# Patient Record
Sex: Female | Born: 1945
Health system: Southern US, Community
[De-identification: ages and names within clinical notes are randomized; demographics above are authoritative.]

## PROBLEM LIST (undated history)

## (undated) DIAGNOSIS — IMO0002 Reserved for concepts with insufficient information to code with codable children: Secondary | ICD-10-CM

## (undated) DIAGNOSIS — J4540 Moderate persistent asthma, uncomplicated: Secondary | ICD-10-CM

## (undated) DIAGNOSIS — L570 Actinic keratosis: Secondary | ICD-10-CM

## (undated) DIAGNOSIS — E785 Hyperlipidemia, unspecified: Secondary | ICD-10-CM

## (undated) DIAGNOSIS — M8580 Other specified disorders of bone density and structure, unspecified site: Secondary | ICD-10-CM

## (undated) DIAGNOSIS — T7840XA Allergy, unspecified, initial encounter: Secondary | ICD-10-CM

## (undated) HISTORY — DX: Allergy, unspecified, initial encounter: T78.40XA

## (undated) HISTORY — DX: Other specified disorders of bone density and structure, unspecified site: M85.80

## (undated) HISTORY — DX: Reserved for concepts with insufficient information to code with codable children: IMO0002

## (undated) HISTORY — PX: EYE SURGERY: SHX253

## (undated) HISTORY — DX: Hyperlipidemia, unspecified: E78.5

## (undated) HISTORY — PX: TRIGGER FINGER RELEASE: SHX641

## (undated) HISTORY — DX: Actinic keratosis: L57.0

## (undated) HISTORY — PX: KNEE CARTILAGE SURGERY: SHX688

## (undated) HISTORY — PX: BACK SURGERY: SHX140

---

## 1987-09-02 HISTORY — PX: ABDOMINAL HYSTERECTOMY: SHX81

## 1999-08-21 ENCOUNTER — Encounter: Admission: RE | Admit: 1999-08-21 | Discharge: 1999-08-21 | Payer: Self-pay | Admitting: Obstetrics and Gynecology

## 1999-08-21 ENCOUNTER — Encounter: Payer: Self-pay | Admitting: Obstetrics and Gynecology

## 2000-03-16 ENCOUNTER — Encounter: Admission: RE | Admit: 2000-03-16 | Discharge: 2000-03-16 | Payer: Self-pay | Admitting: Obstetrics and Gynecology

## 2000-03-16 ENCOUNTER — Encounter: Payer: Self-pay | Admitting: Obstetrics and Gynecology

## 2001-03-18 ENCOUNTER — Encounter: Payer: Self-pay | Admitting: Obstetrics and Gynecology

## 2001-03-18 ENCOUNTER — Encounter: Admission: RE | Admit: 2001-03-18 | Discharge: 2001-03-18 | Payer: Self-pay | Admitting: Obstetrics and Gynecology

## 2001-08-09 ENCOUNTER — Ambulatory Visit (HOSPITAL_COMMUNITY): Admission: RE | Admit: 2001-08-09 | Discharge: 2001-08-09 | Payer: Self-pay | Admitting: Gastroenterology

## 2001-08-23 ENCOUNTER — Encounter: Payer: Self-pay | Admitting: Obstetrics and Gynecology

## 2001-08-23 ENCOUNTER — Encounter: Admission: RE | Admit: 2001-08-23 | Discharge: 2001-08-23 | Payer: Self-pay | Admitting: Obstetrics and Gynecology

## 2001-08-27 ENCOUNTER — Other Ambulatory Visit: Admission: RE | Admit: 2001-08-27 | Discharge: 2001-08-27 | Payer: Self-pay | Admitting: Obstetrics and Gynecology

## 2002-03-25 ENCOUNTER — Encounter: Admission: RE | Admit: 2002-03-25 | Discharge: 2002-03-25 | Payer: Self-pay | Admitting: Family Medicine

## 2002-03-25 ENCOUNTER — Encounter: Payer: Self-pay | Admitting: Family Medicine

## 2003-04-04 ENCOUNTER — Encounter: Payer: Self-pay | Admitting: Family Medicine

## 2003-04-04 ENCOUNTER — Encounter: Admission: RE | Admit: 2003-04-04 | Discharge: 2003-04-04 | Payer: Self-pay | Admitting: Family Medicine

## 2004-03-20 ENCOUNTER — Other Ambulatory Visit: Admission: RE | Admit: 2004-03-20 | Discharge: 2004-03-20 | Payer: Self-pay | Admitting: Family Medicine

## 2004-03-20 ENCOUNTER — Encounter: Payer: Self-pay | Admitting: Family Medicine

## 2004-03-20 LAB — CONVERTED CEMR LAB: Pap Smear: NORMAL

## 2004-04-10 ENCOUNTER — Encounter: Admission: RE | Admit: 2004-04-10 | Discharge: 2004-04-10 | Payer: Self-pay | Admitting: Family Medicine

## 2004-07-05 ENCOUNTER — Ambulatory Visit: Payer: Self-pay | Admitting: Family Medicine

## 2004-08-01 ENCOUNTER — Ambulatory Visit: Payer: Self-pay | Admitting: Family Medicine

## 2004-09-04 ENCOUNTER — Ambulatory Visit: Payer: Self-pay | Admitting: Family Medicine

## 2004-10-14 ENCOUNTER — Ambulatory Visit: Payer: Self-pay | Admitting: Family Medicine

## 2004-10-15 ENCOUNTER — Ambulatory Visit: Payer: Self-pay | Admitting: Family Medicine

## 2004-10-30 ENCOUNTER — Ambulatory Visit: Payer: Self-pay | Admitting: Family Medicine

## 2005-03-10 ENCOUNTER — Ambulatory Visit: Payer: Self-pay | Admitting: Family Medicine

## 2005-04-29 ENCOUNTER — Encounter: Admission: RE | Admit: 2005-04-29 | Discharge: 2005-04-29 | Payer: Self-pay | Admitting: Family Medicine

## 2005-09-01 HISTORY — PX: OTHER SURGICAL HISTORY: SHX169

## 2005-10-31 ENCOUNTER — Ambulatory Visit: Payer: Self-pay | Admitting: Family Medicine

## 2005-11-26 ENCOUNTER — Ambulatory Visit: Payer: Self-pay

## 2006-01-20 ENCOUNTER — Ambulatory Visit: Payer: Self-pay | Admitting: Family Medicine

## 2006-01-30 LAB — HM DEXA SCAN

## 2006-02-04 LAB — FECAL OCCULT BLOOD, GUAIAC: Fecal Occult Blood: NEGATIVE

## 2006-02-06 ENCOUNTER — Ambulatory Visit: Payer: Self-pay | Admitting: Family Medicine

## 2006-02-09 ENCOUNTER — Encounter: Admission: RE | Admit: 2006-02-09 | Discharge: 2006-02-09 | Payer: Self-pay | Admitting: Family Medicine

## 2006-05-15 ENCOUNTER — Ambulatory Visit: Payer: Self-pay | Admitting: Family Medicine

## 2006-06-02 ENCOUNTER — Encounter: Admission: RE | Admit: 2006-06-02 | Discharge: 2006-06-02 | Payer: Self-pay | Admitting: Family Medicine

## 2006-07-20 ENCOUNTER — Ambulatory Visit: Payer: Self-pay | Admitting: Family Medicine

## 2006-12-31 LAB — HM COLONOSCOPY: HM Colonoscopy: NORMAL

## 2007-03-03 ENCOUNTER — Ambulatory Visit: Payer: Self-pay

## 2007-03-03 ENCOUNTER — Encounter: Payer: Self-pay | Admitting: Family Medicine

## 2007-03-22 ENCOUNTER — Encounter: Payer: Self-pay | Admitting: Family Medicine

## 2007-04-06 ENCOUNTER — Encounter: Payer: Self-pay | Admitting: Family Medicine

## 2007-04-06 DIAGNOSIS — J309 Allergic rhinitis, unspecified: Secondary | ICD-10-CM | POA: Insufficient documentation

## 2007-04-06 DIAGNOSIS — E785 Hyperlipidemia, unspecified: Secondary | ICD-10-CM | POA: Insufficient documentation

## 2007-04-06 DIAGNOSIS — J45909 Unspecified asthma, uncomplicated: Secondary | ICD-10-CM | POA: Insufficient documentation

## 2007-04-06 DIAGNOSIS — M8580 Other specified disorders of bone density and structure, unspecified site: Secondary | ICD-10-CM

## 2007-04-06 DIAGNOSIS — R011 Cardiac murmur, unspecified: Secondary | ICD-10-CM | POA: Insufficient documentation

## 2007-04-06 DIAGNOSIS — M810 Age-related osteoporosis without current pathological fracture: Secondary | ICD-10-CM | POA: Insufficient documentation

## 2007-04-07 ENCOUNTER — Encounter: Payer: Self-pay | Admitting: Pain Medicine

## 2007-04-15 DIAGNOSIS — M5137 Other intervertebral disc degeneration, lumbosacral region: Secondary | ICD-10-CM | POA: Insufficient documentation

## 2007-04-30 ENCOUNTER — Ambulatory Visit: Payer: Self-pay | Admitting: Family Medicine

## 2007-05-03 ENCOUNTER — Encounter: Payer: Self-pay | Admitting: Pain Medicine

## 2007-05-04 LAB — CONVERTED CEMR LAB
ALT: 26 units/L (ref 0–35)
AST: 24 units/L (ref 0–37)
Albumin: 4 g/dL (ref 3.5–5.2)
Alkaline Phosphatase: 52 units/L (ref 39–117)
BUN: 10 mg/dL (ref 6–23)
Basophils Absolute: 0 10*3/uL (ref 0.0–0.1)
Basophils Relative: 0.6 % (ref 0.0–1.0)
Bilirubin, Direct: 0.1 mg/dL (ref 0.0–0.3)
CO2: 29 meq/L (ref 19–32)
Calcium: 9.8 mg/dL (ref 8.4–10.5)
Chloride: 104 meq/L (ref 96–112)
Cholesterol: 259 mg/dL (ref 0–200)
Creatinine, Ser: 0.7 mg/dL (ref 0.4–1.2)
Direct LDL: 177.5 mg/dL
Eosinophils Absolute: 0.2 10*3/uL (ref 0.0–0.6)
Eosinophils Relative: 5 % (ref 0.0–5.0)
GFR calc Af Amer: 109 mL/min
GFR calc non Af Amer: 90 mL/min
Glucose, Bld: 86 mg/dL (ref 70–99)
HCT: 39.6 % (ref 36.0–46.0)
HDL: 46.6 mg/dL (ref >39.0)
Hemoglobin: 13.7 g/dL (ref 12.0–15.0)
Lymphocytes Relative: 34.9 % (ref 12.0–46.0)
MCHC: 34.5 g/dL (ref 30.0–36.0)
MCV: 90.9 fL (ref 78.0–100.0)
Monocytes Absolute: 0.4 10*3/uL (ref 0.2–0.7)
Monocytes Relative: 10 % (ref 3.0–11.0)
Neutro Abs: 2.3 10*3/uL (ref 1.4–7.7)
Neutrophils Relative %: 49.5 % (ref 43.0–77.0)
Platelets: 196 10*3/uL (ref 150–400)
Potassium: 4.9 meq/L (ref 3.5–5.1)
RBC: 4.36 M/uL (ref 3.87–5.11)
RDW: 12 % (ref 11.5–14.6)
Sodium: 142 meq/L (ref 135–145)
TSH: 1.42 microintl units/mL (ref 0.35–5.50)
Total Bilirubin: 0.9 mg/dL (ref 0.3–1.2)
Total CHOL/HDL Ratio: 5.6
Total Protein: 7 g/dL (ref 6.0–8.3)
Triglycerides: 168 mg/dL — ABNORMAL HIGH (ref 0–149)
VLDL: 34 mg/dL (ref 0–40)
WBC: 4.4 10*3/uL — ABNORMAL LOW (ref 4.5–10.5)

## 2007-05-10 ENCOUNTER — Encounter: Payer: Self-pay | Admitting: Family Medicine

## 2007-06-02 ENCOUNTER — Encounter: Payer: Self-pay | Admitting: Pain Medicine

## 2007-06-07 ENCOUNTER — Encounter: Admission: RE | Admit: 2007-06-07 | Discharge: 2007-06-07 | Payer: Self-pay | Admitting: Family Medicine

## 2007-06-09 ENCOUNTER — Encounter (INDEPENDENT_AMBULATORY_CARE_PROVIDER_SITE_OTHER): Payer: Self-pay | Admitting: *Deleted

## 2007-07-12 ENCOUNTER — Encounter: Payer: Self-pay | Admitting: Family Medicine

## 2007-07-22 ENCOUNTER — Encounter: Payer: Self-pay | Admitting: Family Medicine

## 2007-08-24 ENCOUNTER — Ambulatory Visit: Payer: Self-pay | Admitting: Family Medicine

## 2007-08-27 ENCOUNTER — Telehealth: Payer: Self-pay | Admitting: Family Medicine

## 2007-08-27 LAB — CONVERTED CEMR LAB
Cholesterol: 205 mg/dL (ref 0–200)
Direct LDL: 129.9 mg/dL
HDL: 49.7 mg/dL (ref >39.0)
Total CHOL/HDL Ratio: 4.1
Triglycerides: 119 mg/dL (ref 0–149)
VLDL: 24 mg/dL (ref 0–40)

## 2007-08-30 ENCOUNTER — Ambulatory Visit: Payer: Self-pay | Admitting: Family Medicine

## 2007-09-29 ENCOUNTER — Encounter: Payer: Self-pay | Admitting: Family Medicine

## 2007-10-01 ENCOUNTER — Telehealth (INDEPENDENT_AMBULATORY_CARE_PROVIDER_SITE_OTHER): Payer: Self-pay | Admitting: *Deleted

## 2007-10-11 ENCOUNTER — Telehealth: Payer: Self-pay | Admitting: Family Medicine

## 2007-10-14 ENCOUNTER — Ambulatory Visit: Payer: Self-pay | Admitting: Family Medicine

## 2007-10-19 ENCOUNTER — Telehealth: Payer: Self-pay | Admitting: Family Medicine

## 2007-10-20 ENCOUNTER — Ambulatory Visit: Payer: Self-pay | Admitting: Family Medicine

## 2007-10-25 ENCOUNTER — Telehealth (INDEPENDENT_AMBULATORY_CARE_PROVIDER_SITE_OTHER): Payer: Self-pay | Admitting: Internal Medicine

## 2007-11-29 ENCOUNTER — Encounter: Payer: Self-pay | Admitting: Family Medicine

## 2008-02-03 ENCOUNTER — Ambulatory Visit: Payer: Self-pay | Admitting: Family Medicine

## 2008-02-14 ENCOUNTER — Ambulatory Visit: Payer: Self-pay | Admitting: Family Medicine

## 2008-02-14 ENCOUNTER — Telehealth: Payer: Self-pay | Admitting: Family Medicine

## 2008-02-14 ENCOUNTER — Encounter (INDEPENDENT_AMBULATORY_CARE_PROVIDER_SITE_OTHER): Payer: Self-pay | Admitting: Internal Medicine

## 2008-02-15 ENCOUNTER — Telehealth (INDEPENDENT_AMBULATORY_CARE_PROVIDER_SITE_OTHER): Payer: Self-pay | Admitting: Internal Medicine

## 2008-02-15 LAB — CONVERTED CEMR LAB
Basophils Absolute: 0.1 10*3/uL (ref 0.0–0.1)
Basophils Relative: 1.1 % — ABNORMAL HIGH (ref 0.0–1.0)
Eosinophils Absolute: 0 10*3/uL (ref 0.0–0.7)
Eosinophils Relative: 0.6 % (ref 0.0–5.0)
HCT: 40.7 % (ref 36.0–46.0)
Hemoglobin: 14.3 g/dL (ref 12.0–15.0)
Lymphocytes Relative: 17.4 % (ref 12.0–46.0)
MCHC: 35.1 g/dL (ref 30.0–36.0)
MCV: 89.9 fL (ref 78.0–100.0)
Monocytes Absolute: 0.6 10*3/uL (ref 0.1–1.0)
Monocytes Relative: 11.4 % (ref 3.0–12.0)
Neutro Abs: 3.5 10*3/uL (ref 1.4–7.7)
Neutrophils Relative %: 69.5 % (ref 43.0–77.0)
Platelets: 157 10*3/uL (ref 150–400)
RBC: 4.53 M/uL (ref 3.87–5.11)
RDW: 12.1 % (ref 11.5–14.6)
WBC: 5.1 10*3/uL (ref 4.5–10.5)

## 2008-02-18 ENCOUNTER — Telehealth (INDEPENDENT_AMBULATORY_CARE_PROVIDER_SITE_OTHER): Payer: Self-pay | Admitting: *Deleted

## 2008-02-25 ENCOUNTER — Ambulatory Visit: Payer: Self-pay | Admitting: Internal Medicine

## 2008-02-25 LAB — CONVERTED CEMR LAB
Bilirubin Urine: NEGATIVE
Glucose, Urine, Semiquant: NEGATIVE
Ketones, urine, test strip: NEGATIVE
Nitrite: NEGATIVE
Protein, U semiquant: NEGATIVE
Specific Gravity, Urine: <1.005
Urobilinogen, UA: 0.2
WBC Urine, dipstick: NEGATIVE
pH: 7.5

## 2008-04-21 ENCOUNTER — Encounter: Payer: Self-pay | Admitting: Family Medicine

## 2008-04-27 ENCOUNTER — Telehealth: Payer: Self-pay | Admitting: Family Medicine

## 2008-05-05 ENCOUNTER — Encounter: Admission: RE | Admit: 2008-05-05 | Discharge: 2008-05-05 | Payer: Self-pay | Admitting: Family Medicine

## 2008-05-05 ENCOUNTER — Encounter: Payer: Self-pay | Admitting: Family Medicine

## 2008-06-09 ENCOUNTER — Encounter: Admission: RE | Admit: 2008-06-09 | Discharge: 2008-06-09 | Payer: Self-pay | Admitting: Family Medicine

## 2008-06-20 ENCOUNTER — Encounter: Payer: Self-pay | Admitting: Family Medicine

## 2008-07-01 ENCOUNTER — Ambulatory Visit: Payer: Self-pay | Admitting: Unknown Physician Specialty

## 2008-07-26 ENCOUNTER — Ambulatory Visit: Payer: Self-pay | Admitting: Family Medicine

## 2008-07-31 ENCOUNTER — Telehealth: Payer: Self-pay | Admitting: Family Medicine

## 2008-07-31 LAB — CONVERTED CEMR LAB
ALT: 20 units/L (ref 0–35)
AST: 24 units/L (ref 0–37)
Albumin: 4.1 g/dL (ref 3.5–5.2)
Alkaline Phosphatase: 51 units/L (ref 39–117)
BUN: 11 mg/dL (ref 6–23)
Basophils Absolute: 0 10*3/uL (ref 0.0–0.1)
Basophils Relative: 0 % (ref 0.0–3.0)
Bilirubin, Direct: 0.1 mg/dL (ref 0.0–0.3)
CO2: 29 meq/L (ref 19–32)
Calcium: 9.6 mg/dL (ref 8.4–10.5)
Chloride: 106 meq/L (ref 96–112)
Cholesterol: 211 mg/dL (ref 0–200)
Creatinine, Ser: 0.7 mg/dL (ref 0.4–1.2)
Direct LDL: 118.7 mg/dL
Eosinophils Absolute: 0.1 10*3/uL (ref 0.0–0.7)
Eosinophils Relative: 2.4 % (ref 0.0–5.0)
GFR calc Af Amer: 108 mL/min
GFR calc non Af Amer: 90 mL/min
Glucose, Bld: 79 mg/dL (ref 70–99)
HCT: 41.9 % (ref 36.0–46.0)
HDL: 68 mg/dL (ref >39.0)
Hemoglobin: 14.7 g/dL (ref 12.0–15.0)
Lymphocytes Relative: 38.5 % (ref 12.0–46.0)
MCHC: 35.2 g/dL (ref 30.0–36.0)
MCV: 90.2 fL (ref 78.0–100.0)
Monocytes Absolute: 0.5 10*3/uL (ref 0.1–1.0)
Monocytes Relative: 9.7 % (ref 3.0–12.0)
Neutro Abs: 2.3 10*3/uL (ref 1.4–7.7)
Neutrophils Relative %: 49.4 % (ref 43.0–77.0)
Platelets: 173 10*3/uL (ref 150–400)
Potassium: 4.3 meq/L (ref 3.5–5.1)
RBC: 4.65 M/uL (ref 3.87–5.11)
RDW: 12.7 % (ref 11.5–14.6)
Sodium: 142 meq/L (ref 135–145)
TSH: 1.3 microintl units/mL (ref 0.35–5.50)
Total Bilirubin: 0.9 mg/dL (ref 0.3–1.2)
Total CHOL/HDL Ratio: 3.1
Total Protein: 7.5 g/dL (ref 6.0–8.3)
Triglycerides: 84 mg/dL (ref 0–149)
VLDL: 17 mg/dL (ref 0–40)
Vit D, 1,25-Dihydroxy: 34 (ref 30–89)
WBC: 4.7 10*3/uL (ref 4.5–10.5)

## 2008-08-15 ENCOUNTER — Ambulatory Visit: Payer: Self-pay | Admitting: Family Medicine

## 2008-11-07 ENCOUNTER — Encounter (INDEPENDENT_AMBULATORY_CARE_PROVIDER_SITE_OTHER): Payer: Self-pay | Admitting: Internal Medicine

## 2009-04-16 ENCOUNTER — Telehealth: Payer: Self-pay | Admitting: Family Medicine

## 2009-04-30 ENCOUNTER — Telehealth: Payer: Self-pay | Admitting: Family Medicine

## 2009-07-31 ENCOUNTER — Ambulatory Visit: Payer: Self-pay | Admitting: Family Medicine

## 2009-07-31 DIAGNOSIS — M2550 Pain in unspecified joint: Secondary | ICD-10-CM | POA: Insufficient documentation

## 2009-08-01 LAB — CONVERTED CEMR LAB
ALT: 25 units/L (ref 0–35)
AST: 23 units/L (ref 0–37)
Albumin: 4.1 g/dL (ref 3.5–5.2)
Alkaline Phosphatase: 54 units/L (ref 39–117)
BUN: 11 mg/dL (ref 6–23)
Basophils Absolute: 0 10*3/uL (ref 0.0–0.1)
Basophils Relative: 0.8 % (ref 0.0–3.0)
Bilirubin, Direct: 0 mg/dL (ref 0.0–0.3)
CO2: 30 meq/L (ref 19–32)
Calcium: 9.4 mg/dL (ref 8.4–10.5)
Chloride: 104 meq/L (ref 96–112)
Cholesterol: 254 mg/dL — ABNORMAL HIGH (ref 0–200)
Creatinine, Ser: 0.7 mg/dL (ref 0.4–1.2)
Direct LDL: 184.1 mg/dL
Eosinophils Absolute: 0.2 10*3/uL (ref 0.0–0.7)
Eosinophils Relative: 4.7 % (ref 0.0–5.0)
GFR calc non Af Amer: 89.14 mL/min (ref >60)
Glucose, Bld: 92 mg/dL (ref 70–99)
HCT: 41.2 % (ref 36.0–46.0)
HDL: 48.9 mg/dL (ref >39.00)
Hemoglobin: 13.9 g/dL (ref 12.0–15.0)
Lymphocytes Relative: 34.4 % (ref 12.0–46.0)
Lymphs Abs: 1.4 10*3/uL (ref 0.7–4.0)
MCHC: 33.8 g/dL (ref 30.0–36.0)
MCV: 92.1 fL (ref 78.0–100.0)
Monocytes Absolute: 0.5 10*3/uL (ref 0.1–1.0)
Monocytes Relative: 12 % (ref 3.0–12.0)
Neutro Abs: 2.1 10*3/uL (ref 1.4–7.7)
Neutrophils Relative %: 48.1 % (ref 43.0–77.0)
Platelets: 167 10*3/uL (ref 150.0–400.0)
Potassium: 3.9 meq/L (ref 3.5–5.1)
RBC: 4.47 M/uL (ref 3.87–5.11)
RDW: 11.8 % (ref 11.5–14.6)
Sed Rate: 10 mm/hr (ref 0–22)
Sodium: 141 meq/L (ref 135–145)
TSH: 1.05 microintl units/mL (ref 0.35–5.50)
Total Bilirubin: 0.9 mg/dL (ref 0.3–1.2)
Total CHOL/HDL Ratio: 5
Total Protein: 7.2 g/dL (ref 6.0–8.3)
Triglycerides: 138 mg/dL (ref 0.0–149.0)
VLDL: 27.6 mg/dL (ref 0.0–40.0)
Vit D, 25-Hydroxy: 37 ng/mL (ref 30–89)
WBC: 4.2 10*3/uL — ABNORMAL LOW (ref 4.5–10.5)

## 2009-08-03 ENCOUNTER — Telehealth: Payer: Self-pay | Admitting: Family Medicine

## 2009-08-28 ENCOUNTER — Encounter: Admission: RE | Admit: 2009-08-28 | Discharge: 2009-08-28 | Payer: Self-pay | Admitting: Family Medicine

## 2009-09-03 ENCOUNTER — Encounter (INDEPENDENT_AMBULATORY_CARE_PROVIDER_SITE_OTHER): Payer: Self-pay | Admitting: *Deleted

## 2009-09-28 ENCOUNTER — Ambulatory Visit: Payer: Self-pay | Admitting: Family Medicine

## 2009-09-28 DIAGNOSIS — J018 Other acute sinusitis: Secondary | ICD-10-CM | POA: Insufficient documentation

## 2009-10-05 ENCOUNTER — Telehealth: Payer: Self-pay | Admitting: Family Medicine

## 2009-10-16 ENCOUNTER — Ambulatory Visit: Payer: Self-pay | Admitting: Unknown Physician Specialty

## 2010-06-04 ENCOUNTER — Encounter: Payer: Self-pay | Admitting: Family Medicine

## 2010-08-02 ENCOUNTER — Telehealth (INDEPENDENT_AMBULATORY_CARE_PROVIDER_SITE_OTHER): Payer: Self-pay | Admitting: *Deleted

## 2010-08-06 ENCOUNTER — Ambulatory Visit: Payer: Self-pay | Admitting: Family Medicine

## 2010-08-06 LAB — CONVERTED CEMR LAB
ALT: 16 units/L (ref 0–35)
AST: 21 units/L (ref 0–37)
Albumin: 3.9 g/dL (ref 3.5–5.2)
Alkaline Phosphatase: 64 units/L (ref 39–117)
BUN: 9 mg/dL (ref 6–23)
Bilirubin, Direct: 0.1 mg/dL (ref 0.0–0.3)
CO2: 26 meq/L (ref 19–32)
Calcium: 9.2 mg/dL (ref 8.4–10.5)
Chloride: 106 meq/L (ref 96–112)
Cholesterol: 213 mg/dL — ABNORMAL HIGH (ref 0–200)
Creatinine, Ser: 0.7 mg/dL (ref 0.4–1.2)
Direct LDL: 139.3 mg/dL
GFR calc non Af Amer: 93.47 mL/min (ref >60.00)
Glucose, Bld: 94 mg/dL (ref 70–99)
HDL: 53.2 mg/dL (ref >39.00)
Phosphorus: 3.8 mg/dL (ref 2.3–4.6)
Potassium: 3.9 meq/L (ref 3.5–5.1)
Sodium: 140 meq/L (ref 135–145)
TSH: 1.4 microintl units/mL (ref 0.35–5.50)
Total Bilirubin: 1 mg/dL (ref 0.3–1.2)
Total CHOL/HDL Ratio: 4
Total Protein: 6.6 g/dL (ref 6.0–8.3)
Triglycerides: 79 mg/dL (ref 0.0–149.0)
VLDL: 15.8 mg/dL (ref 0.0–40.0)

## 2010-08-07 LAB — CONVERTED CEMR LAB: Vit D, 25-Hydroxy: 38 ng/mL (ref 30–89)

## 2010-08-15 ENCOUNTER — Ambulatory Visit: Payer: Self-pay | Admitting: Family Medicine

## 2010-09-13 ENCOUNTER — Encounter
Admission: RE | Admit: 2010-09-13 | Discharge: 2010-09-13 | Payer: Self-pay | Source: Home / Self Care | Attending: Family Medicine | Admitting: Family Medicine

## 2010-09-13 LAB — HM MAMMOGRAPHY: HM Mammogram: NORMAL

## 2010-09-17 ENCOUNTER — Encounter: Payer: Self-pay | Admitting: Family Medicine

## 2010-09-17 ENCOUNTER — Encounter (INDEPENDENT_AMBULATORY_CARE_PROVIDER_SITE_OTHER): Payer: Self-pay | Admitting: *Deleted

## 2010-09-20 ENCOUNTER — Ambulatory Visit
Admission: RE | Admit: 2010-09-20 | Discharge: 2010-09-20 | Payer: Self-pay | Source: Home / Self Care | Attending: Family Medicine | Admitting: Family Medicine

## 2010-09-26 ENCOUNTER — Telehealth: Payer: Self-pay | Admitting: Family Medicine

## 2010-10-01 NOTE — Letter (Signed)
Summary: [facility]  [facility]   Imported By: Maryln Gottron 06/17/2010 15:09:10  _____________________________________________________________________  External Attachment:    Type:   Image     Comment:   External Document

## 2010-10-01 NOTE — Progress Notes (Signed)
----   Converted from flag ---- ---- 08/01/2010 9:52 PM, Colon Flattery Tower MD wrote: please check lipid/ hepatic/ renal / tsh / vit D for 272 and 733.0 thanks  ---- 07/31/2010 10:55 AM, Liane Comber CMA (AAMA) wrote: Lab orders please! Good Morning! This pt is scheduled for cpx labs Tuesday, which labs to draw and dx codes to use? Thanks Tasha ------------------------------

## 2010-10-01 NOTE — Assessment & Plan Note (Signed)
Summary: ?SINUS INFECTION/CLE   Vital Signs:  Patient profile:   65 year old female Height:      63 inches Weight:      188.25 pounds BMI:     33.47 Temp:     98.4 degrees F oral Pulse rate:   84 / minute Pulse rhythm:   regular BP sitting:   144 / 86  (left arm) Cuff size:   regular  Vitals Entered By: Delilah Shan CMA Duncan Dull) (September 28, 2009 3:03 PM) CC: ? sinus infection.  See HPI note.   History of Present Illness: 65 yo female with 3 weeks of sinus congestion, runny nose, subjective fevers, and chills. No shortness of breath or wheezing. She was hoping it was just viral and would resolve but seems to be gettin worse over last few days. Taking Flonase, Clarinex, and using [employer]. Her nose has stopped running but facial pressure and ear pressure is much worse. No n/v/d.  Allergic to PCN, Sulfa, and BIaxin.  Current Medications (verified): 1)  Miacalcin 200 Unit/act Soln (Calcitonin (Salmon)) .[name].. 1 Spray in One Nostril Once Daily (Alternate Nostrils) 2)  Flonase 50 Mcg/act  Susp (Fluticasone Propionate) .... 2 Sprays Each Nostril Daily For Allergy Symptoms 3)  Calcium With Vit D 1200 Mg .... Take By Mouth Daily As Directed 4)  Ocuvite-Lutein   Caps (Multiple Vitamins-Minerals) .... Take One By Mouth Daily 5)  Flax Seed Oil .... Take 2 Capsules Daily 6)  Fish Oil .... Take 2 Capsules Daily 7)  Glucosamine .... Take 1 Tablet By Mouth Two Times A Day 8)  Chondroitin Complex .... Take 2 By Mouth Daily 9)  Albuterol 90 Mcg/act  Aers (Albuterol) .... As Directed 10)  Allegra Odt 30 Mg  Tbdp (Fexofenadine Hcl) .... Take 1 Tablet Daily 11)  Co Q 10 Caps .... Daily 12)  Advair Diskus 100-50 Mcg/dose Misc (Fluticasone-Salmeterol) .... Use One Puff Two Times A Day 13)  Guaifenesin 400 Mg Tabs (Guaifenesin) .... As Needed 14)  Delsym 30 Mg/35ml Lqcr (Dextromethorphan Polistirex) .... As Needed 15)  Diclofenac Sodium 75 Mg Tbec (Diclofenac Sodium) .... Take One By Mouth Two  Times A Day With Food As Needed Joint Pain 16)  Zovirax 5 % Crea (Acyclovir) .... Apply To Affected Area / Cold Sore 4 Times Daily Until Better 17)  Azithromycin 250 Mg  Tabs (Azithromycin) .... 2 By  Mouth Today and Then 1 Daily For 4 Days  Allergies: 1)  ! Biaxin 2)  ! Evista 3)  Penicillin 4)  Sulfa 5)  Fosamax 6)  * Aciphex  Review of Systems      See HPI General:  Complains of chills, fever, and malaise; denies weakness. ENT:  Complains of earache, nasal congestion, postnasal drainage, and sinus pressure; denies ear discharge and sore throat. CV:  Denies chest pain or discomfort. Resp:  Complains of cough and sputum productive; denies shortness of breath and wheezing. GI:  Denies abdominal pain, diarrhea, nausea, and vomiting. Derm:  Denies rash.  Physical Exam  General:  overweight but generally well appearing  Ears:  TM retracted bilaterally. Nose:  nasal dischargemucosal pallor.   + sinuses Mouth:  pharynx pink and moist.   Lungs:  Normal respiratory effort, chest expands symmetrically. Lungs are clear to auscultation, no crackles or wheezes. Heart:  RRR soft systolic M, no gallops Extremities:  no edema Psych:  normal affect, talkative and pleasant    Impression & Recommendations:  Problem # 1:  OTHER ACUTE SINUSITIS (  ICD-461.8) Assessment New Given duration and progression of symptoms, will treat with abx. See patient instructions for details. Her updated medication list for this problem includes:    Flonase 50 Mcg/act Susp (Fluticasone propionate) .[name]... 2 sprays each nostril daily for allergy symptoms    Guaifenesin 400 Mg Tabs (Guaifenesin) .[name]... As needed    Delsym 30 Mg/86ml Lqcr (Dextromethorphan polistirex) .[name]... As needed    Azithromycin 250 Mg Tabs (Azithromycin) .[name]... 2 by  mouth today and then 1 daily for 4 days  Complete Medication List: 1)  Miacalcin 200 Unit/act Soln (Calcitonin (salmon)) .[name].. 1 spray in one nostril once daily (alternate  nostrils) 2)  Flonase 50 Mcg/act Susp (Fluticasone propionate) .... 2 sprays each nostril daily for allergy symptoms 3)  Calcium With Vit D 1200 Mg  .... Take by mouth daily as directed 4)  Ocuvite-lutein Caps (Multiple vitamins-minerals) .... Take one by mouth daily 5)  Flax Seed Oil  .... Take 2 capsules daily 6)  Fish Oil  .... Take 2 capsules daily 7)  Glucosamine  .... Take 1 tablet by mouth two times a day 8)  Chondroitin Complex  .... Take 2 by mouth daily 9)  Albuterol 90 Mcg/act Aers (Albuterol) .... As directed 10)  Allegra Odt 30 Mg Tbdp (Fexofenadine hcl) .... Take 1 tablet daily 11)  Co Q 10 Caps  .... Daily 12)  Advair Diskus 100-50 Mcg/dose Misc (Fluticasone-salmeterol) .... Use one puff two times a day 13)  Guaifenesin 400 Mg Tabs (Guaifenesin) .... As needed 14)  Delsym 30 Mg/61ml Lqcr (Dextromethorphan polistirex) .... As needed 15)  Diclofenac Sodium 75 Mg Tbec (Diclofenac sodium) .... Take one by mouth two times a day with food as needed joint pain 16)  Zovirax 5 % Crea (Acyclovir) .... Apply to affected area / cold sore 4 times daily until better 17)  Azithromycin 250 Mg Tabs (Azithromycin) .... 2 by  mouth today and then 1 daily for 4 days  Patient Instructions: 1)  Take antibiotic as directed.  Drink lots of fluids.  Treat sympotmatically with Mucinex, nasal saline irrigation, and Tylenol/Ibuprofen. You can also try using warm compresses.  Cough suppressant at night. Call if not improving as expected in 5-7 days.  Prescriptions: AZITHROMYCIN 250 MG  TABS (AZITHROMYCIN) 2 by  mouth today and then 1 daily for 4 days  #6 x 0   Entered and Authorized by:   Ruthe Mannan MD   Signed by:   Ruthe Mannan MD on 09/28/2009   Method used:   [facility]        [id number]       [address]       [place], [place]  [address]       Ph: [phone] or [phone]       Fax: [phone]   RxID:   [id number]   Current Allergies  (reviewed today): ! BIAXIN ! EVISTA PENICILLIN SULFA FOSAMAX * ACIPHEX

## 2010-10-01 NOTE — Progress Notes (Signed)
Summary: sinus and cough  Phone Note Call from Patient Call back at [phone]   Caller: Patient Call For: Dr Dayton Martes Summary of Call: Saw Dr Dayton Martes 09/28/09 Finished zpack on 10/02/09. Pt states feels better but teeth hurt, ears still have fluid, feels like sinus infection starting again. Drainage at back of scratchy throat and dry cough. No SOB and no fever. [phone]. Please advise.  Initial call taken by: Lewanda Rife LPN,  October 05, 2009 8:33 AM  Follow-up for Phone Call        Zpack remains in system 5 days after completed so it's still in her system.  Continue taking Mucinex and Ibuprofen, does not need another antibiotic at this point. Follow-up by: Ruthe Mannan MD,  October 05, 2009 8:49 AM  Additional Follow-up for Phone Call Additional follow up Details #1::        Left message on voicemail  in detail.  Personalized VM.  Additional Follow-up by: Delilah Shan CMA (AAMA),  October 05, 2009 11:40 AM

## 2010-10-01 NOTE — Miscellaneous (Signed)
Summary: mammo results  Clinical Lists Changes  Observations: Added new observation of MAMMO DUE: 09/2010 (09/03/2009 8:35) Added new observation of MAMMOGRAM: normal (09/03/2009 8:35)      Preventive Care Screening  Mammogram:    Date:  09/03/2009    Next Due:  09/2010    Results:  normal

## 2010-10-03 NOTE — Assessment & Plan Note (Signed)
Summary: CPX/CLE   Vital Signs:  Patient profile:   65 year old female Height:      62.75 inches Weight:      182.25 pounds BMI:     32.66 Temp:     98.1 degrees F oral Pulse rate:   76 / minute Pulse rhythm:   regular BP sitting:   128 / 76  (left arm) Cuff size:   regular  Vitals Entered By: Lewanda Rife LPN (August 15, 2010 10:44 AM) CC: CPX LMP complete hyst 1989   History of Present Illness: here for check up of chronic med problems is generally feeling better  one bout of uri in oct with 7 d of prednisone and a shot of steroids  since then - quit taking her medicines - most of them  zithromax -- gave her stomach cramps -- needs to avoid that in the future   last 3 weeks more sinus symptoms   joints have not hurt as much without as many medicines  no longer feels bruised and hurting  has been off miacalcin 1-2 weeks   [facility] and does [facility]   wt is down 6 lb - proud of this (but she lost and gained some)   bp well controlled at 128/76   lipids are imp with LDL 139 down from 184  (was on glucosamine) also eating better - staying away from sat fats  too many chips - tries to get away from them    osteopenia dexa 9/09  -- wants to do at same place as mamm vit D stable at 38  tot hyst-- no gyn problems at all  pap 05  colonosc 08- due in 2013  mam 1/11  - needs her mam  self exam -no lumps   Td08 flu utd ptx utd zostavax 08     Allergies: 1)  ! Biaxin 2)  ! Evista 3)  ! Diclofenac Sodium (Diclofenac Sodium) 4)  ! Codeine 5)  Penicillin 6)  Sulfa 7)  Fosamax 8)  * Aciphex 9)  Zithromax 10)  * Fish Oil  Past History:  Past Medical History: Last updated: 07/31/2009 Allergic rhinitis Asthma Hyperlipidemia Osteopenia Osteoporosis low back pain/deg disk dz- with infections tinnitus mild hearing loss    dermMayford Knife  Past Surgical History: Last updated: 04/06/2007 Hysterectomy- total, fibroids  (1989) Dexa- (08/2001),   OP worse (04/2004) Colonoscopy (2002) Abd [place]- neg (09/2002) Colonoscopy, Dr. Matthias Hughs- neg 912/2002) Torn meniscus- left (2007) Dexa- stable OP (01/2006) Colonoscopy- tics recheck 5 years (12/2006)  Family History: Last updated: 07/26/2008 Father: MI Mother: Liver cancer- ? primary, OP Siblings: 6. 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 Eyes:  Denies blurring, discharge, and eye pain. ENT:  Complains of postnasal drainage. CV:  Denies chest pain or discomfort, lightheadness, and palpitations. Resp:  Denies shortness of breath and wheezing. GI:  Denies change in bowel habits, indigestion, nausea, and vomiting. GU:  Denies dysuria and urinary frequency. MS:  Denies cramps and stiffness. Derm:  Denies itching, lesion(s), poor wound healing, and rash. Neuro:  Denies  numbness and tingling. Psych:  Denies sense of great danger. Endo:  Denies cold intolerance, excessive thirst, excessive urination, and heat intolerance. Heme:  Denies abnormal bruising and bleeding.  Physical Exam  General:  overweight but generally well appearing  Head:  normocephalic, atraumatic, and no abnormalities observed.  Eyes:  vision grossly intact, pupils equal, pupils round, and pupils reactive to light.  no conjunctival pallor, injection or icterus  Ears:  R ear normal and L ear normal.   Mouth:  pharynx pink and moist.   Neck:  supple with full rom and no masses or thyromegally, no JVD or carotid bruit  Chest Wall:  No deformities, masses, or tenderness noted. Breasts:  No mass,  nodules, thickening, tenderness, bulging, retraction, inflamation, nipple discharge or skin changes noted.   Lungs:  Normal respiratory effort, chest expands symmetrically. Lungs are clear to auscultation, no crackles or wheezes. Heart:  RRR soft systolic M, no gallops Abdomen:  Bowel sounds positive,abdomen soft and non-tender without masses, organomegaly or hernias noted. no renal bruits  Msk:  No deformity or scoliosis noted of thoracic or lumbar spine.  no acute joint changes  Pulses:  R and L carotid,radial,femoral,dorsalis pedis and posterior tibial pulses are full and equal bilaterally Extremities:  no edema Neurologic:  sensation intact to light touch, gait normal, and DTRs symmetrical and normal.   Skin:  Intact without suspicious lesions or rashes Cervical Nodes:  No lymphadenopathy noted Inguinal Nodes:  No significant adenopathy Psych:  normal affect, talkative and pleasant    Impression & Recommendations:  Problem # 1:  OTHER SCREENING MAMMOGRAM (ICD-V76.12) Assessment Comment Only annual mammogram scheduled adv pt to continue regular self breast exams non remarkable breast exam today  Orders: Radiology Referral (Radiology)  Problem # 2:  OSTEOPOROSIS (ICD-733.00) Assessment: Unchanged will schedule f/u dexa disc med options  continue ca and D and work on exercise  The following medications were removed from the medication list:    Miacalcin 200 Unit/act Soln (Calcitonin (salmon)) .[name]... 1 spray in one nostril once daily (alternate nostrils)  Orders: Radiology Referral (Radiology)  Problem # 3:  HYPERLIPIDEMIA (ICD-272.4) Assessment: Improved  this is imporved with diet - disc imp of low sat fat diet   Labs Reviewed: SGOT: 21 (08/06/2010)   SGPT: 16 (08/06/2010)   HDL:53.20 (08/06/2010), 48.90 (07/31/2009)  LDL:DEL (07/26/2008), DEL (08/24/2007)  Chol:213 (08/06/2010), 254 (07/31/2009)  Trig:79.0 (08/06/2010), 138.0 (07/31/2009)  Problem # 4:  ASTHMA  (ICD-493.90) Assessment: Unchanged in good control wihtou change Her updated medication list for this problem includes:    Albuterol 90 Mcg/act Aers (Albuterol) .[name]... As directed    Advair Diskus 100-50 Mcg/dose Misc (Fluticasone-salmeterol) ..... Use one puff two times a day  Complete Medication List: 1)  Flonase 50 Mcg/act Susp (Fluticasone propionate) .... 2 sprays each nostril daily for allergy symptoms 2)  Calcium With Vit D 1200 Mg  .... Take by mouth daily as directed 3)  Albuterol 90 Mcg/act Aers (Albuterol) .... As directed 4)  Allegra Odt 30 Mg Tbdp (Fexofenadine hcl) .... Take 2  tablets  daily as needed 5)  Advair Diskus 100-50 Mcg/dose Misc (Fluticasone-salmeterol) .... Use one puff two times a day  Patient Instructions: 1)  get back on calcium and vitamin D 2)  continue your allergy and asthma medicines  3)  we will reconsider the miacalcin later  4)  the current recommendation for calcium intake is 1200-1500 mg daily with -1000 IU of vitamin D  5)  is ok to  take zantac or prilosec as needed for reflux -- but if more than twice a week you will need daily therapy 6)  we will schedule mammogram and dexa at check out    Orders Added: 1)  Radiology Referral [Radiology] 2)  Radiology Referral [Radiology] 3)  Est. Patient Level IV [[address]]   Immunization History:  Influenza Immunization History:    [facility]   Immunization History:  Influenza Immunization History:    [facility]  [facility]  Current Allergies (reviewed today): ! BIAXIN ! EVISTA ! DICLOFENAC SODIUM (DICLOFENAC SODIUM) ! CODEINE PENICILLIN SULFA FOSAMAX * ACIPHEX ZITHROMAX * FISH OIL

## 2010-10-03 NOTE — Progress Notes (Signed)
Summary: prolia  Phone Note Call from Patient Call back at Home Phone [phone]   Caller: Patient Call For: Judith Part MD Summary of Call: Patient checked with insurance company about the Prolia and she will have to pay a $100 copay for 6 months. Patient is also concerned with the side eff that come with it. She is asking if you could send her some information on it before she decides.  Initial call taken by: Melody Comas,  September 26, 2010 3:10 PM  Follow-up for Phone Call        please mail her handout  in IN box thanks  Follow-up by: Judith Part MD,  September 27, 2010 11:08 AM  Additional Follow-up for Phone Call Additional follow up Details #1::        Left message for patient to call back. Lewanda Rife LPN  September 27, 2010 12:07 PM   Patient notified as instructed by telephone. Mailing address verified.Lewanda Rife LPN  September 27, 2010 12:27 PM     New/Updated Medications: PROLIA 60 MG/ML SOLN (DENOSUMAB) as directed Prescriptions: PROLIA 60 MG/ML SOLN (DENOSUMAB) as directed  #1 x 0   Entered and Authorized by:   Judith Part MD   Signed by:   Judith Part MD on 09/27/2010   Method used:   Print then Give to Patient   RxID:   [id number]   Handout requested.

## 2010-10-03 NOTE — Miscellaneous (Signed)
Summary: Mammogram entry to flowsheet  Clinical Lists Changes  Observations: Added new observation of MAMMO DUE: 09/2011 (09/17/2010 8:11) Added new observation of MAMMOGRAM: normal (09/13/2010 8:12)      Preventive Care Screening  Mammogram:    Date:  09/13/2010    Next Due:  09/2011    Results:  normal

## 2010-10-03 NOTE — Assessment & Plan Note (Signed)
Summary: discuss bone density/alc   Vital Signs:  Patient profile:   65 year old female Weight:      182.50 pounds BMI:     32.70 Temp:     98.1 degrees F oral Pulse rate:   80 / minute Pulse rhythm:   regular BP sitting:   122 / 82  (left arm) Cuff size:   regular  Vitals Entered By: Selena Batten Dance CMA Duncan Dull) (September 20, 2010 3:11 PM) CC: Discuss DEXA   History of Present Illness: here to disc OP on dexa    she had worsening at spine with T score -2.8 which is worrisome  FN score stable at -1.1  GI intol fosamax -- gave her reflux symptoms , no trouble swallowing with it  it also gave her leg aches and pains  she tends towards reflux anyway  evista gave her leg cramps - severe  was on miacalcin nasal spray - no problems with it --- ? works at all , but no side effects   reclast is not an option due to bone pain with bisphosophenate    ca-- good  D-- good no missed doses  vit D level is 38  just had a stomach bug-- getting over that  bland diet  still a bit nauseated     exercise -- [facility] -- [facility] 90 minutes  does water exercise due to old chronic back pain  does a little walkig  has bulging   fracture history -- no broken bones   mother and gmother and sister and 2 aunts have osteoporosis  one sister does not have       Allergies: 1)  ! Biaxin 2)  ! Evista 3)  ! Diclofenac Sodium (Diclofenac Sodium) 4)  ! Codeine 5)  Penicillin 6)  Sulfa 7)  Fosamax 8)  * Aciphex 9)  Zithromax 10)  * Fish Oil  Past History:  Past Medical History: Last updated: 07/31/2009 Allergic rhinitis Asthma Hyperlipidemia Osteopenia Osteoporosis low back pain/deg disk dz- with infections tinnitus mild hearing loss    dermMayford Knife  Past Surgical History: Last updated: 04/06/2007 Hysterectomy- total, fibroids (1989) Dexa- (08/2001),   OP worse (04/2004) Colonoscopy (2002) Abd [place]- neg (09/2002) Colonoscopy, Dr. Matthias Hughs- neg  912/2002) Torn meniscus- left (2007) Dexa- stable OP (01/2006) Colonoscopy- tics recheck 5 years (12/2006)  Family History: Last updated: 07/26/2008 Father: MI Mother: Liver cancer- ? primary, OP Siblings: 6. 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 Eyes:  Denies blurring. CV:  Denies chest pain or discomfort and palpitations. Resp:  Denies cough, shortness of breath, and wheezing. GI:  Complains of indigestion; occ indigestion if she eats the wrong things. GU:  Denies urinary frequency. MS:  Complains of low back pain and stiffness; chronic back pain from disc dz as well as general aches and pains . Derm:  Denies itching, lesion(s), poor wound healing, and rash. Neuro:  Denies numbness and tingling. Heme:  Denies abnormal bruising and bleeding.  Physical Exam  General:  overwt with small stature  Eyes:  pupils equal, pupils round,  and pupils reactive to light.   Neck:  supple with full rom and no masses or thyromegally, no JVD or carotid bruit  Msk:  no kyphosis small frame no acute joint changes  Neurologic:  nl DTRs Skin:  Intact without suspicious lesions or rashes Psych:  pleasant , but slt anx    Impression & Recommendations:  Problem # 1:  OSTEOPOROSIS (ICD-733.00) Assessment Deteriorated  pt with worsening OP despite miacalcin  mostly in LS  had a very long disc about OP and risks of fracture as well as various tx options and contraindications given her past rxn to meds also disc family hx in detail  failed fosamax- GI intol and bone pai - so hesitant to try  reclast  failed evista- cramping  updated her drug intolerances today will call ins about coverage or prolia and let me know  (this may be a good option) disc ca and vit d and exercise in detail spent 25 minutes face to face time with pt , over 50% of which was spent on counseling and coordination of care   Her updated medication list for this problem includes:    Miacalcin 200 Unit/ml Soln (Calcitonin (salmon)) .[name]... 1 spray in one nostril daily- alternate nostrils  Orders: Prescription Created Electronically [phone])  Complete Medication List: 1)  Flonase 50 Mcg/act Susp (Fluticasone propionate) .... 2 sprays each nostril daily for allergy symptoms 2)  Calcium With Vit D 1200 Mg  .... Take by mouth daily as directed 3)  Albuterol 90 Mcg/act Aers (Albuterol) .... As directed 4)  Allegra Odt 30 Mg Tbdp (Fexofenadine hcl) .... Take 2  tablets  daily as needed 5)  Advair Diskus 100-50 Mcg/dose Misc (Fluticasone-salmeterol) .... Use one puff two times a day 6)  Miacalcin 200 Unit/ml Soln (Calcitonin (salmon)) .[name].. 1 spray in one nostril daily- alternate nostrils  Patient Instructions: 1)  call your insurance and ask about the price of new osteoporosis drug Prolia -- see how coverage is  2)  tell them you cannot take any bisphosphenates/ fosamax and also evista 3)  tell them that you also had worsening of your symptoms on miacalcin (though we may still use it for a while for lack of other treatment)  4)  let me know what they say and if affordible -- will send you some informatio on the drug  Prescriptions: MIACALCIN 200 UNIT/ML SOLN (CALCITONIN (SALMON)) 1 spray in one nostril daily- alternate nostrils  #1 month x 11   Entered and Authorized by:   Judith Part MD   Signed by:   Judith Part MD on 09/20/2010   Method used:   [facility]        [id number]       [address]       [place], [place]  [address]       Ph: [phone] or  [phone]       Fax: [phone]   RxID:   [id number]    Orders Added: 1)  Prescription Created Electronically [[id number]] 2)  Est. Patient Level IV [43329]    Current Allergies (reviewed today): ! BIAXIN ! EVISTA ! DICLOFENAC SODIUM (DICLOFENAC SODIUM) ! CODEINE PENICILLIN SULFA FOSAMAX * ACIPHEX ZITHROMAX * FISH OIL

## 2010-10-03 NOTE — Letter (Signed)
Summary: Results Follow up Letter  [place] at [facility]  [address] [place], [place] [address]   Phone: [phone]  Fax: [phone]    09/17/2010 MRN: [id number]  [facility] [place] [address] [place], [place]  [address]  Dear Jody Avery,  The following are the results of your recent test(s):  Test         Result    Pap Smear:        Normal _____  Not Normal _____ Comments: ______________________________________________________ Cholesterol: LDL(Bad cholesterol):         Your goal is less than:         HDL (Good cholesterol):       Your goal is more than: Comments:  ______________________________________________________ Mammogram:        Normal __X___  Not Normal _____ Comments:Repeat in 1 year  ___________________________________________________________________ Hemoccult:        Normal _____  Not normal _______ Comments:    _____________________________________________________________________ Other Tests:    We routinely do not discuss normal results over the telephone.  If you desire a copy of the results, or you have any questions about this information we can discuss them at your next office visit.   Sincerely,       Idamae Schuller Tower,MD

## 2010-10-30 ENCOUNTER — Telehealth: Payer: Self-pay | Admitting: Family Medicine

## 2010-11-04 ENCOUNTER — Telehealth: Payer: Self-pay | Admitting: Family Medicine

## 2010-11-06 ENCOUNTER — Telehealth: Payer: Self-pay | Admitting: Family Medicine

## 2010-11-07 NOTE — Progress Notes (Signed)
Summary: prolia injection   Phone Note Call from Patient Call back at Home Phone [phone]   Caller: Patient Call For: Judith Part MD Summary of Call: Patient is calling regarding the prolia injections. She says that if it is okay with you she would rather hold off on taking them until it has been on the market longer. She is just concerned with it being so new.  Initial call taken by: Melody Comas,  October 30, 2010 9:14 AM  Follow-up for Phone Call        that is fine with me  Follow-up by: Judith Part MD,  October 30, 2010 1:45 PM  Additional Follow-up for Phone Call Additional follow up Details #1::        Patient notified as instructed by telephone. Lewanda Rife LPN  October 30, 2010 3:58 PM

## 2010-11-12 NOTE — Progress Notes (Signed)
Summary: pt got tdap today  Phone Note Call from Patient   Caller: Patient Call For: Judith Part MD Summary of Call: Pt reports that she was given Tdap today, at [employer].  Immunization history updated. Initial call taken by: Lowella Petties CMA, AAMA,  November 06, 2010 4:41 PM      Immunization History:  Tetanus/Td Immunization History:    Tetanus/Td:  tdap (11/06/2010)

## 2010-11-12 NOTE — Progress Notes (Signed)
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  I advised her that she had Td in 2008, but she did not get the pertussis.  She will get the booster at school. Initial call taken by: Lowella Petties CMA, AAMA,  November 04, 2010 8:49 AM  Follow-up for Phone Call        go ahead and get the booster at school - please let me know the date and I will then put in her chart Follow-up by: Judith Part MD,  November 04, 2010 9:33 AM  Additional Follow-up for Phone Call Additional follow up Details #1::        Patient notified as instructed by telephone. Additional Follow-up by: Sydell Axon LPN,  November 04, 2010 10:57 AM

## 2011-01-17 NOTE — Procedures (Signed)
Jody Avery. [facility]  Patient:    Jody Avery, KARBOWSKI Visit Number: [id number] MRN: [id number]          Service Type: END Location: ENDO Attending Physician:  Rich Brave Dictated by:   Florencia Reasons, M.D. Proc. Date: 08/09/01 Admit Date:  08/09/2001   CC:         Soyla Murphy. Renne Crigler, M.D.   Procedure Report  PROCEDURE PERFORMED:  Colonoscopy.  ENDOSCOPIST:  Florencia Reasons, M.D.  INDICATIONS FOR PROCEDURE:  Screening for colon cancer in a 65 year old female.  FINDINGS:  Normal exam.  DESCRIPTION OF PROCEDURE:  The nature, purpose and risks of the procedure had been discussed with the patient, who provided written consent.  Sedation was fentanyl 40 mcg and Versed 40 mg IV without arrhythmias or desaturation.  The Olympus pediatric standard video colonoscope was advanced to the cecum without significant difficulty, turning the patient into the supine position and applying a little bit of external abdominal compression to facilitate advancement.  Pullback was then performed.  The quality of the prep was very good and it is felt that all areas were well seen.  This was a normal examination.  No polyps, cancer, colitis, vascular malformations or diverticulosis were noted.  Retroflexion in the rectum as well as repeat reinspection of the rectum were unremarkable.  No biopsies were obtained.  The patient tolerated the procedure well and there were no apparent complications.  IMPRESSION:  Normal screening colonoscopy.  PLAN: Consider follow-up screening colonoscopy in five years. Dictated by:   Florencia Reasons, M.D. Attending Physician:  Rich Brave DD:  08/09/01 TD:  08/09/01 Job: [phone] [id number] [id number]

## 2011-03-10 ENCOUNTER — Encounter: Payer: Self-pay | Admitting: Family Medicine

## 2011-03-11 ENCOUNTER — Encounter: Payer: Self-pay | Admitting: Family Medicine

## 2011-03-11 ENCOUNTER — Ambulatory Visit (INDEPENDENT_AMBULATORY_CARE_PROVIDER_SITE_OTHER): Payer: Medicare Other | Admitting: Family Medicine

## 2011-03-11 VITALS — BP 116/76 | HR 88 | Temp 98.4°F | Ht 62.75 in | Wt 186.5 lb

## 2011-03-11 DIAGNOSIS — M2550 Pain in unspecified joint: Secondary | ICD-10-CM | POA: Insufficient documentation

## 2011-03-11 NOTE — Patient Instructions (Signed)
Labs today for auto immune arthritis and inflammation If these are normal - we will get some x rays  If that is normal - perhaps consider tick labs  We will make a plan based on results  If you need stronger pain med let me know

## 2011-03-11 NOTE — Progress Notes (Signed)
Subjective:    Patient ID: Jody Avery, female    DOB: [birth year], 65 y.o.   MRN: [id number]  HPI Hands have been hurting sometimes severely for past 4 weeks -- more than usual  R index and middle fingers are worse R 5th finger is swollen  Has been doing water exercise -- using noodles (nothing new)  Hurts even to hurt the pages of a book   Had injection in finger in past for trigger - no longer locking at all  Just pain now   This is limiting her in use of keyboard and painting -- etc   R knee also bothers her  Has torn meniscus in her L knee- from water exercise  occ shoulders hurt   No tick bites or rashes or fever  Tried voltaren gel and it gave her a rash  Cannot tolerate oral nsaids  No relief with tylenol     Addendum-- pt states she had tick bite over a year ago - with itching - -- then was treated for uri  ? Remember if joint complaints at that time or fever  Patient Active Problem List  Diagnoses  . HYPERLIPIDEMIA  . OTHER ACUTE SINUSITIS  . ALLERGIC RHINITIS  . ASTHMA  . ARTHRALGIA  . DISC DISEASE, LUMBAR  . OSTEOPOROSIS  . MURMUR  . Joint pain   Past Medical History  Diagnosis Date  . Allergy     allergic rhinitis  . Asthma   . Hyperlipidemia   . Osteoporosis   . Osteopenia   . DDD (degenerative disc disease)     low back pain deg disc dz with infections  . Tinnitus   . Hearing loss     mild   Past Surgical History  Procedure Date  . Abdominal hysterectomy 1989    total, fibroids  . Torn meniscus 2007    left    History  Substance Use Topics  . Smoking status: Never Smoker   . Smokeless tobacco: Not on file  . Alcohol Use: No   Family History  Problem Relation Age of Onset  . Osteoporosis Mother   . Cancer Mother     ? liver cancer ? primary  . Heart disease Father     MI  . Osteoporosis Sister   . Diabetes Sister   . Heart disease Sister     CHF  . Heart disease Brother     CAD  . Heart disease Brother     CAD  .  Osteoporosis Sister   . Diabetes Sister    Allergies  Allergen Reactions  . Alendronate Sodium     REACTION: GI  . Azithromycin     REACTION: GI upset  . Clarithromycin     REACTION: stomach upset  . Codeine     REACTION: nausea and vomiting  . Diclofenac Sodium     REACTION: hands itching  . Fish Oil     REACTION: reflux  . Meloxicam Itching    Hands itched. And urine was rusty colored.  [name] Penicillins     REACTION: mouth swelling  . Rabeprazole Sodium     REACTION: GI  . Raloxifene     REACTION: leg pain and cramps  . Sulfonamide Derivatives     REACTION: rash   Current Outpatient Prescriptions on File Prior to Visit  Medication Sig Dispense Refill  . albuterol (PROVENTIL,VENTOLIN) 90 MCG/ACT inhaler as directed.        . calcitonin, salmon, (MIACALCIN) 200 UNIT/ACT  nasal spray Place 1 spray into the nose daily. Alternate nostrils       . CALCIUM-VITAMIN D PO Take 1,200 mg by mouth daily. as directed       . fluticasone (FLONASE) 50 MCG/ACT nasal spray Place 2 sprays into the nose daily.        . Fluticasone-Salmeterol (ADVAIR DISKUS) 100-50 MCG/DOSE AEPB Inhale 1 puff into the lungs every 12 (twelve) hours.        . fexofenadine (ALLEGRA ODT) 30 MG disintegrating tablet Take 60 mg by mouth daily as needed.              Review of Systems Review of Systems  Constitutional: Negative for fever, appetite change, fatigue and unexpected weight change.  Eyes: Negative for pain and visual disturbance.  Respiratory: Negative for cough and shortness of breath.   Cardiovascular: Negative. For cp or palp   Gastrointestinal: Negative for nausea, diarrhea and constipation.  Genitourinary: Negative for urgency and frequency.  Skin: Negative for pallor. no rash MSK pos for joint pain and occ swelling  Neurological: Negative for weakness, light-headedness, numbness and headaches.  Hematological: Negative for adenopathy. Does not bruise/bleed easily.  Psychiatric/Behavioral:  Negative for dysphoric mood. The patient is not nervous/anxious.         Objective:   Physical Exam  Constitutional: She appears well-developed and well-nourished. No distress.       overwt and well appearing   HENT:  Head: Normocephalic and atraumatic.  Mouth/Throat: Oropharynx is clear and moist.  Eyes: Conjunctivae and EOM are normal. Pupils are equal, round, and reactive to light.  Neck: Normal range of motion. Neck supple. No JVD present. No thyromegaly present.  Cardiovascular: Normal rate, regular rhythm, normal heart sounds and intact distal pulses.   Pulmonary/Chest: Effort normal and breath sounds normal. No respiratory distress. She has no wheezes.  Abdominal: Soft.  Musculoskeletal: She exhibits tenderness.       Fingers are diffusely tender -worse in R hand  Difficulty making fist in R hand due to pain  MCPs not swollen but tender-esp on palmar surface Some general finger swelling - looks more like soft tissue than joints No deformity or contracture No triggering  No other joint findings   Lymphadenopathy:    She has no cervical adenopathy.  Neurological: She is alert. She has normal strength and normal reflexes. No sensory deficit. Coordination normal.  Skin: Skin is warm and dry. No rash noted. No erythema. No pallor.  Psychiatric: She has a normal mood and affect.          Assessment & Plan:

## 2011-03-11 NOTE — Assessment & Plan Note (Signed)
Hand pain bilateral but worse on R  Tender on palmar side of MCPs Some swelling  - no deformity  Will check some rheumatoid labs today  If neg consider x rays/ tick labs

## 2011-03-12 LAB — CBC WITH DIFFERENTIAL/PLATELET
Basophils Absolute: 0 10*3/uL (ref 0.0–0.1)
Basophils Relative: 0.3 % (ref 0.0–3.0)
Eosinophils Absolute: 0.3 10*3/uL (ref 0.0–0.7)
Eosinophils Relative: 4.4 % (ref 0.0–5.0)
HCT: 40 % (ref 36.0–46.0)
Hemoglobin: 13.8 g/dL (ref 12.0–15.0)
Lymphocytes Relative: 30.5 % (ref 12.0–46.0)
Lymphs Abs: 2 10*3/uL (ref 0.7–4.0)
MCHC: 34.5 g/dL (ref 30.0–36.0)
MCV: 91.2 fl (ref 78.0–100.0)
Monocytes Absolute: 0.5 10*3/uL (ref 0.1–1.0)
Monocytes Relative: 8.4 % (ref 3.0–12.0)
Neutro Abs: 3.6 10*3/uL (ref 1.4–7.7)
Neutrophils Relative %: 56.4 % (ref 43.0–77.0)
Platelets: 182 10*3/uL (ref 150.0–400.0)
RBC: 4.39 Mil/uL (ref 3.87–5.11)
RDW: 13.4 % (ref 11.5–14.6)
WBC: 6.5 10*3/uL (ref 4.5–10.5)

## 2011-03-12 LAB — URIC ACID: Uric Acid, Serum: 5.7 mg/dL (ref 2.4–7.0)

## 2011-03-12 LAB — RHEUMATOID FACTOR: Rhuematoid fact SerPl-aCnc: <10 IU/mL (ref <=14)

## 2011-03-12 LAB — SEDIMENTATION RATE: Sed Rate: 10 mm/hr (ref 0–22)

## 2011-03-12 LAB — ANA: Anti Nuclear Antibody(ANA): NEGATIVE

## 2011-03-14 ENCOUNTER — Ambulatory Visit (INDEPENDENT_AMBULATORY_CARE_PROVIDER_SITE_OTHER)
Admission: RE | Admit: 2011-03-14 | Discharge: 2011-03-14 | Disposition: A | Discharge status: Home / Self Care | Payer: Medicare Other | Source: Ambulatory Visit | Attending: Family Medicine | Admitting: Family Medicine

## 2011-03-14 ENCOUNTER — Ambulatory Visit
Admission: RE | Admit: 2011-03-14 | Discharge: 2011-03-14 | Disposition: A | Discharge status: Home / Self Care | Payer: Medicare Other | Source: Ambulatory Visit | Attending: Family Medicine | Admitting: Family Medicine

## 2011-03-14 ENCOUNTER — Telehealth: Payer: Self-pay

## 2011-03-14 DIAGNOSIS — M2550 Pain in unspecified joint: Secondary | ICD-10-CM

## 2011-03-14 DIAGNOSIS — M79609 Pain in unspecified limb: Secondary | ICD-10-CM

## 2011-03-14 DIAGNOSIS — M79643 Pain in unspecified hand: Secondary | ICD-10-CM

## 2011-03-14 NOTE — Telephone Encounter (Signed)
Patient notified as instructed by telephone. Jody Avery in xray is making appt for pt now and Jody Avery asked if Dr Milinda Antis would put the order in for both hands to be xrayed. Thank you.

## 2011-03-14 NOTE — Telephone Encounter (Signed)
Message copied by Patience Musca on Fri Mar 14, 2011 11:03 AM ------      Message from: Roxy Manns A      Created: Thu Mar 13, 2011 10:55 AM       Please alert pt that all labs are normal - which is re- assuring      Next I would like to get x rays of her hands - please schedule that when someone is here to do them       Let me know if agreeable and I will do the orders

## 2011-03-14 NOTE — Telephone Encounter (Signed)
Terri from xray said pt scheduled to come for hands xray today 03/14/11 at 2pm. Please put order in for xray. Thank you.

## 2011-03-14 NOTE — Telephone Encounter (Signed)
Here is order for hand x ray

## 2011-03-17 ENCOUNTER — Telehealth: Payer: Self-pay

## 2011-03-17 DIAGNOSIS — M2550 Pain in unspecified joint: Secondary | ICD-10-CM

## 2011-03-17 DIAGNOSIS — W57XXXA Bitten or stung by nonvenomous insect and other nonvenomous arthropods, initial encounter: Secondary | ICD-10-CM

## 2011-03-17 NOTE — Telephone Encounter (Signed)
Patient notified as instructed by telephone. Pt is willing to have lab done and if needed go to rheumatology. Pt is getting ready to go out of town and would like call back ASAP about lab appt.

## 2011-03-17 NOTE — Telephone Encounter (Signed)
Message copied by Patience Musca on Mon Mar 17, 2011  9:30 AM ------      Message from: Roxy Manns A      Created: Sun Mar 16, 2011 12:46 PM       Hand xrays show some signs of osteoarthritis in her distal finger joints - but no changes in the joints that are hurting her       I would like to get some tick fever labs - since she mentioned the tick bite      If those come back neg I would like to ref her to rheumatology      Please ask if agreeable and I will order labs

## 2011-03-17 NOTE — Telephone Encounter (Signed)
Patient notified as instructed by telephone. Pt scheduled lab appt for 03/18/11 at 8:50am. (Dr Milinda Antis has already put in future order for tick labs.)

## 2011-03-17 NOTE — Telephone Encounter (Signed)
Here is order for tick labs  thanks

## 2011-03-18 ENCOUNTER — Other Ambulatory Visit (INDEPENDENT_AMBULATORY_CARE_PROVIDER_SITE_OTHER): Payer: Medicare Other | Admitting: Family Medicine

## 2011-03-18 DIAGNOSIS — W57XXXA Bitten or stung by nonvenomous insect and other nonvenomous arthropods, initial encounter: Secondary | ICD-10-CM

## 2011-03-18 DIAGNOSIS — T148 Other injury of unspecified body region: Secondary | ICD-10-CM

## 2011-03-18 DIAGNOSIS — M2550 Pain in unspecified joint: Secondary | ICD-10-CM

## 2011-03-19 LAB — ROCKY MTN SPOTTED FVR ABS PNL(IGG+IGM)
RMSF IgG: 0.96 IV — ABNORMAL HIGH
RMSF IgM: 0.34 IV

## 2011-03-19 LAB — B. BURGDORFI ANTIBODIES: B burgdorferi Ab IgG+IgM: 0.18 {ISR}

## 2011-03-25 ENCOUNTER — Telehealth: Payer: Self-pay

## 2011-03-25 DIAGNOSIS — M79643 Pain in unspecified hand: Secondary | ICD-10-CM

## 2011-03-25 DIAGNOSIS — M2550 Pain in unspecified joint: Secondary | ICD-10-CM

## 2011-03-25 NOTE — Telephone Encounter (Signed)
Left vm for pt to callback 

## 2011-03-25 NOTE — Telephone Encounter (Signed)
Message copied by Patience Musca on Tue Mar 25, 2011  6:10 PM ------      Message from: Roxy Manns A      Created: Thu Mar 20, 2011  1:58 PM       Labs show likely past infx with [place] spotted fever but no current infx      Lyme tests negative       Next step for hand pain is referral to specialist - rheumatologist      Let me know if agreeable and I will go ahead

## 2011-03-26 NOTE — Telephone Encounter (Signed)
Patient notified as instructed by telephone. Pt is agreeable to see rheumatologist. Pt would like to see Dr Lavenia Atlas in [place] unless Dr Milinda Antis has a better choice of rheumatologist in [place]. Pt is out of town and would like for pt care coordinator to wait until Friday afternoon 03/28/11 or after to call pt either at home # or Cell # about appt.

## 2011-03-26 NOTE — Telephone Encounter (Signed)
I will do ref and route to [place]  

## 2011-03-27 NOTE — Telephone Encounter (Signed)
Appt made with Dr Lavenia Atlas on 04/23/2011 at 2pm. [facility]

## 2011-05-03 LAB — HM DEXA SCAN

## 2011-07-04 ENCOUNTER — Ambulatory Visit (INDEPENDENT_AMBULATORY_CARE_PROVIDER_SITE_OTHER): Payer: Medicare Other | Admitting: Family Medicine

## 2011-07-04 ENCOUNTER — Ambulatory Visit: Payer: Medicare Other | Admitting: Family Medicine

## 2011-07-04 ENCOUNTER — Encounter: Payer: Self-pay | Admitting: Family Medicine

## 2011-07-04 VITALS — BP 152/96 | HR 80 | Temp 98.3°F | Wt 178.0 lb

## 2011-07-04 DIAGNOSIS — R05 Cough: Secondary | ICD-10-CM

## 2011-07-04 DIAGNOSIS — J329 Chronic sinusitis, unspecified: Secondary | ICD-10-CM | POA: Insufficient documentation

## 2011-07-04 DIAGNOSIS — R059 Cough, unspecified: Secondary | ICD-10-CM

## 2011-07-04 MED ORDER — PREDNISONE 20 MG PO TABS: ORAL_TABLET | ORAL | Status: DC

## 2011-07-04 MED ORDER — HYDROCOD POLST-CHLORPHEN POLST 10-8 MG/5ML PO LQCR: 5.0000 mL | Freq: Every evening | ORAL | Status: DC | PRN

## 2011-07-04 MED ORDER — IPRATROPIUM BROMIDE 0.02 % IN SOLN
0.5000 mg | Freq: Once | RESPIRATORY_TRACT | Status: AC
  Administered 2011-07-04: 0.5 mg via RESPIRATORY_TRACT

## 2011-07-04 MED ORDER — LEVOFLOXACIN 500 MG PO TABS: 500.0000 mg | ORAL_TABLET | Freq: Every day | ORAL | Status: AC

## 2011-07-04 MED ORDER — ALBUTEROL SULFATE (2.5 MG/3ML) 0.083% IN NEBU
2.5000 mg | INHALATION_SOLUTION | Freq: Once | RESPIRATORY_TRACT | Status: AC
  Administered 2011-07-04: 2.5 mg via RESPIRATORY_TRACT

## 2011-07-04 NOTE — Patient Instructions (Addendum)
I think you have bronchitis that has caused asthma flare.  Treat with prednisone taper as well as levaquin for infection and albuterol scheduled every 6 hour for next 3 days. May use tussionex for cough at night time. If fever >101 or worsening cough, worsening breathing, please be evaluated again over weekend.  Otherwise let us know if not improving as expected. I hope you start feeling better soon.  Call us with questions.

## 2011-07-04 NOTE — Assessment & Plan Note (Signed)
Anticipate bronchitis causing asthma flare as patient states flares tend to present as coughing fits (despite no wheezing on exam). Treat as such with prednisone taper, levaquin given duration and recent worsening and several allergies to other meds, and scheduled albuterol. Red flags to return or seek urgent care over weekend discussed.

## 2011-07-04 NOTE — Progress Notes (Signed)
  Subjective:    Patient ID: Jody Avery, female    DOB: 1945-11-14, 65 y.o.   MRN: [id number]  HPI CC: cold  65yo with h/o asthma, allergies followed by Dr. Clyde Callas presents with 15 day h/o coughing fits, sinus congestion and drainage.  Endorses temperature up to almost 100.  Having chest tightness and SOB at night - albuterol has helped this.  Coughing green mucous.  Wonders if having asthma attack.    States asthma flares usually present as bad coughing, not as wheezing.  Using tussionex, proair, delsym OTC.  Takes advair daily.  Also taking allegra.  Yesterday felt better and actually substitued at school.  Then last night started feeling worse again.  No abd pain, n/v/d, rashes.  No smokers at home.  Husband and daughter also sick.  bp up today, however no h/o HTN, wonders if due to all this coughing.  Review of Systems Per HPI    Objective:   Physical Exam  Nursing note and vitals reviewed. Constitutional: She appears well-developed and well-nourished. No distress.  HENT:  Head: Normocephalic and atraumatic.  Right Ear: Hearing, tympanic membrane, external ear and ear canal normal.  Left Ear: Hearing, tympanic membrane, external ear and ear canal normal.  Nose: No mucosal edema or rhinorrhea.  Mouth/Throat: Uvula is midline, oropharynx is clear and moist and mucous membranes are normal. No oropharyngeal exudate, posterior oropharyngeal edema, posterior oropharyngeal erythema or tonsillar abscesses.       Mild sinus tenderness  Cardiovascular: Normal rate, regular rhythm, normal heart sounds and intact distal pulses.   No murmur heard. Pulmonary/Chest: Effort normal and breath sounds normal. No respiratory distress. She has no wheezes. She has no rales.       Evident coughing fits, no wheezing  Musculoskeletal: She exhibits no edema.  Skin: Skin is warm and dry. No rash noted.  after alb/atrovent neb - subjective improved breathing and objective improved coughing, no  change in lung exam.    Assessment & Plan:

## 2011-07-17 ENCOUNTER — Telehealth: Payer: Self-pay | Admitting: *Deleted

## 2011-07-17 ENCOUNTER — Ambulatory Visit (INDEPENDENT_AMBULATORY_CARE_PROVIDER_SITE_OTHER): Payer: Medicare Other | Admitting: Family Medicine

## 2011-07-17 ENCOUNTER — Encounter: Payer: Self-pay | Admitting: Family Medicine

## 2011-07-17 VITALS — BP 140/80 | HR 78 | Temp 98.2°F | Wt 176.8 lb

## 2011-07-17 DIAGNOSIS — J329 Chronic sinusitis, unspecified: Secondary | ICD-10-CM

## 2011-07-17 MED ORDER — LEVOFLOXACIN 500 MG PO TABS: 500.0000 mg | ORAL_TABLET | Freq: Every day | ORAL | Status: AC

## 2011-07-17 NOTE — Telephone Encounter (Signed)
Would need to see her to further evaluate. Alternatively pt can make appt with asthma doc for further eval.

## 2011-07-17 NOTE — Telephone Encounter (Signed)
Patient notified and will be here this afternoon.

## 2011-07-17 NOTE — Progress Notes (Signed)
  Subjective:    Patient ID: Jody Avery, female    DOB: 10/05/1945, 65 y.o.   MRN: [id number]  HPI CC: not better.  Seen here 07/04/2011 with concern for bronchitis leading to asthma flare, treated with levaquin given mult abx allergies as well as course of prednisone.  States didn't do as well as expected on prednisone/levaquin.  Now with tooth, nose, head pain - like pressure sinus pain.  Feels head all clogged up.  Sometimes getting lightheaded.  Finished antibiotic last Friday.  Some pain in ribcage when coughing.  Using neti pot at night and in am.  Not using tussionex because cough better.  Cough and breathing improved.  Only using advair bid.  Not really using albuterol anymore.  sxs going on ~ 1 mo now.  No fevers/chills, abd pain, n/v, ST.  No ear pain.  Review of Systems Per HPI    Objective:   Physical Exam  Nursing note and vitals reviewed. Constitutional: She appears well-developed and well-nourished. No distress.  HENT:  Head: Normocephalic and atraumatic.  Right Ear: Hearing, tympanic membrane, external ear and ear canal normal.  Left Ear: Hearing, tympanic membrane, external ear and ear canal normal.  Nose: No mucosal edema or rhinorrhea. Right sinus exhibits maxillary sinus tenderness. Right sinus exhibits no frontal sinus tenderness. Left sinus exhibits maxillary sinus tenderness. Left sinus exhibits no frontal sinus tenderness.  Mouth/Throat: Uvula is midline, oropharynx is clear and moist and mucous membranes are normal. No oropharyngeal exudate, posterior oropharyngeal edema, posterior oropharyngeal erythema or tonsillar abscesses.  Eyes: Conjunctivae and EOM are normal. Pupils are equal, round, and reactive to light. No scleral icterus.  Neck: Normal range of motion. Neck supple. No JVD present. No thyromegaly present.  Cardiovascular: Normal rate, regular rhythm, normal heart sounds and intact distal pulses.   No murmur heard. Pulmonary/Chest: Effort normal and  breath sounds normal. No respiratory distress. She has no wheezes. She has no rales.  Lymphadenopathy:    She has no cervical adenopathy.  Skin: Skin is warm and dry. No rash noted.       Assessment & Plan:

## 2011-07-17 NOTE — Assessment & Plan Note (Addendum)
sxs consistent with sinusitis.  Treat with longer course of levaquin x10 days. Asthma flare resolved. Update [place] if not improving.

## 2011-07-17 NOTE — Patient Instructions (Signed)
I think you have sinus infection now. I would treat with levaquin longer course - 10 days.  I will send antibiotic in. Continue neti pot, push fluids and rest, update [place] if not improving. Sinusitis Sinuses are air pockets within the bones of your face. The growth of bacteria within a sinus leads to infection. The infection prevents the sinuses from draining. This infection is called sinusitis. SYMPTOMS  There will be different areas of pain depending on which sinuses have become infected.  The maxillary sinuses often produce pain beneath the eyes.   Frontal sinusitis may cause pain in the middle of the forehead and above the eyes.  Other problems (symptoms) include:  Toothaches.   Colored, pus-like (purulent) drainage from the nose.   Swelling, warmth, and tenderness over the sinus areas may be signs of infection.  TREATMENT  Sinusitis is most often determined by an exam.X-rays may be taken. If x-rays have been taken, make sure you obtain your results or find out how you are to obtain them. Your caregiver may give you medications (antibiotics). These are medications that will help kill the bacteria causing the infection. You may also be given a medication (decongestant) that helps to reduce sinus swelling.  HOME CARE INSTRUCTIONS   Only take over-the-counter or prescription medicines for pain, discomfort, or fever as directed by your caregiver.   Drink extra fluids. Fluids help thin the mucus so your sinuses can drain more easily.   Applying either moist heat or ice packs to the sinus areas may help relieve discomfort.   Use saline nasal sprays to help moisten your sinuses. The sprays can be found at your local drugstore.  SEEK IMMEDIATE MEDICAL CARE IF:  You have a fever.   You have increasing pain, severe headaches, or toothache.   You have nausea, vomiting, or drowsiness.   You develop unusual swelling around the face or trouble seeing.  MAKE SURE YOU:   Understand these  instructions.   Will watch your condition.   Will get help right away if you are not doing well or get worse.  Document Released: 08/18/2005 Document Revised: 04/30/2011 Document Reviewed: 03/17/2007 [facility] Patient Information 2012 [place], [place].

## 2011-07-17 NOTE — Telephone Encounter (Signed)
Spoke with patient. She says she is still not better after prednisone and levaquin. She has a horrible headache and teeth pain, she still has the cough and now has developed chest pain from coughing so much. She hasn't been running any fever, but just can't seem to get well. She has been using the flonase and nasal saline as instructed. She has an appt this afternoon with you for a recheck, but wanted to see if there was anything you could recommend without seeing her. She prefers not to have anymore steroids because they have made her joints hurt and just feel horrible in general and she really doesn't want more abx if she can avoid them. I advised that she may indeed need another round of abx. She understood but doesn't want anymore steroids. She is expecting me to call her back around lunch and cancel her appt if you think she can get better without being seen. I did advise her that it would probably be best for her to have a recheck since this has been going on for quite some time now. She verbalized understanding.

## 2011-08-01 ENCOUNTER — Telehealth: Payer: Self-pay

## 2011-08-01 NOTE — Telephone Encounter (Signed)
Pt finished Levaquin last Sat. But still has productive cough with clear to yellow mucus in AM. Pts joints all over ache. No fever. Pt does not want more antibiotics. Pt wants to know how long after being sick should she wait to get flu shot? Pt understands lateness of today pt may receive call next week and she is fine with that. Pt can be reached at [phone] or [phone].

## 2011-08-01 NOTE — Telephone Encounter (Signed)
As long as no fever - can get the flu shot any time F/u if worse or no further improvement

## 2011-08-05 NOTE — Telephone Encounter (Signed)
Left vm for pt to callback 

## 2011-08-06 NOTE — Telephone Encounter (Signed)
Patient notified as instructed by telephone. 

## 2011-09-06 ENCOUNTER — Ambulatory Visit: Payer: Self-pay

## 2011-09-26 ENCOUNTER — Ambulatory Visit: Payer: Self-pay | Admitting: General Practice

## 2011-10-01 ENCOUNTER — Other Ambulatory Visit: Payer: Self-pay | Admitting: Family Medicine

## 2011-10-08 ENCOUNTER — Ambulatory Visit: Payer: Self-pay | Admitting: General Practice

## 2011-10-20 ENCOUNTER — Ambulatory Visit: Payer: Self-pay

## 2011-10-31 ENCOUNTER — Other Ambulatory Visit: Payer: Self-pay | Admitting: Family Medicine

## 2011-10-31 DIAGNOSIS — Z1231 Encounter for screening mammogram for malignant neoplasm of breast: Secondary | ICD-10-CM

## 2011-11-03 ENCOUNTER — Telehealth: Payer: Self-pay | Admitting: Family Medicine

## 2011-11-03 DIAGNOSIS — M810 Age-related osteoporosis without current pathological fracture: Secondary | ICD-10-CM

## 2011-11-03 DIAGNOSIS — E785 Hyperlipidemia, unspecified: Secondary | ICD-10-CM

## 2011-11-03 NOTE — Telephone Encounter (Signed)
Message copied by Judy Pimple on Mon Nov 03, 2011  5:51 PM ------      Message from: Baldomero Lamy      Created: Thu Oct 30, 2011  8:45 AM      Regarding: Cpx labs Tues 11/04/11       Please order  future cpx labs for pt's upcomming lab appt.      Thanks      Rodney Booze

## 2011-11-04 ENCOUNTER — Other Ambulatory Visit (INDEPENDENT_AMBULATORY_CARE_PROVIDER_SITE_OTHER): Payer: Medicare Other

## 2011-11-04 DIAGNOSIS — M810 Age-related osteoporosis without current pathological fracture: Secondary | ICD-10-CM

## 2011-11-04 DIAGNOSIS — E785 Hyperlipidemia, unspecified: Secondary | ICD-10-CM

## 2011-11-04 LAB — COMPREHENSIVE METABOLIC PANEL
ALT: 19 U/L (ref 0–35)
AST: 19 U/L (ref 0–37)
Albumin: 4.2 g/dL (ref 3.5–5.2)
Alkaline Phosphatase: 64 U/L (ref 39–117)
BUN: 11 mg/dL (ref 6–23)
CO2: 26 mEq/L (ref 19–32)
Calcium: 9.5 mg/dL (ref 8.4–10.5)
Chloride: 103 mEq/L (ref 96–112)
Creatinine, Ser: 0.6 mg/dL (ref 0.4–1.2)
GFR: 98.18 mL/min (ref >60.00)
Glucose, Bld: 94 mg/dL (ref 70–99)
Potassium: 4 mEq/L (ref 3.5–5.1)
Sodium: 135 mEq/L (ref 135–145)
Total Bilirubin: 0.6 mg/dL (ref 0.3–1.2)
Total Protein: 7 g/dL (ref 6.0–8.3)

## 2011-11-04 LAB — LIPID PANEL
Cholesterol: 222 mg/dL — ABNORMAL HIGH (ref 0–200)
HDL: 57.7 mg/dL (ref >39.00)
Total CHOL/HDL Ratio: 4
Triglycerides: 125 mg/dL (ref 0.0–149.0)
VLDL: 25 mg/dL (ref 0.0–40.0)

## 2011-11-04 LAB — TSH: TSH: 1.55 u[IU]/mL (ref 0.35–5.50)

## 2011-11-04 LAB — LDL CHOLESTEROL, DIRECT: Direct LDL: 149.1 mg/dL

## 2011-11-05 LAB — VITAMIN D 25 HYDROXY (VIT D DEFICIENCY, FRACTURES): Vit D, 25-Hydroxy: 47 ng/mL (ref 30–89)

## 2011-11-11 ENCOUNTER — Ambulatory Visit (INDEPENDENT_AMBULATORY_CARE_PROVIDER_SITE_OTHER): Payer: Medicare Other | Admitting: Family Medicine

## 2011-11-11 ENCOUNTER — Encounter: Payer: Self-pay | Admitting: Family Medicine

## 2011-11-11 VITALS — BP 116/72 | HR 80 | Temp 97.9°F | Ht 62.75 in | Wt 175.8 lb

## 2011-11-11 DIAGNOSIS — M8580 Other specified disorders of bone density and structure, unspecified site: Secondary | ICD-10-CM

## 2011-11-11 DIAGNOSIS — M949 Disorder of cartilage, unspecified: Secondary | ICD-10-CM

## 2011-11-11 DIAGNOSIS — M25569 Pain in unspecified knee: Secondary | ICD-10-CM

## 2011-11-11 DIAGNOSIS — E785 Hyperlipidemia, unspecified: Secondary | ICD-10-CM

## 2011-11-11 DIAGNOSIS — M899 Disorder of bone, unspecified: Secondary | ICD-10-CM

## 2011-11-11 MED ORDER — CALCITONIN (SALMON) 200 UNIT/ACT NA SOLN: 1.0000 | Freq: Every day | NASAL | Status: DC

## 2011-11-11 NOTE — Patient Instructions (Addendum)
Avoid red meat/ fried foods/ egg yolks/ fatty breakfast meats/ butter, cheese and high fat dairy/ and shellfish  Schedule fasting lab for cholesterol in 6 months and then follow up  Let us know later this year when you are due for colonoscopy if you need a referral

## 2011-11-11 NOTE — Assessment & Plan Note (Signed)
Chol is up  Suspect genetic because diet is good Pt wants to try harder Disc low sat fat diet Lab and f/u 6 mo

## 2011-11-11 NOTE — Assessment & Plan Note (Signed)
Due dexa in fall  Rev ca and D No fractures Rev safety

## 2011-11-11 NOTE — Assessment & Plan Note (Signed)
Still having much knee pain from mensical surg 5 weeks ago  Has f/u ortho on tues  Mild swelling

## 2011-11-11 NOTE — Progress Notes (Signed)
Subjective:    Patient ID: Jody Avery, female    DOB: 1945/09/16, 66 y.o.   MRN: [id number]  HPI Here for check up of chronic medical conditions and to review health mt list    Had meniscal knee surgery 5 weeks ago - is getting a little better  Sees her orthopedic doctor soon  Takes a lot of tylenol , hurts a lot at night  Some arthritis in knee too - thinks this should hold her 10-20 years   Wt is stable bmi is 31 Not as active  Has really been working on good healthy diet  Eating veg and fruits and hummus   Colonoscopy 5/08- is due this year - is waiting for note in may to schedule it   mammo 1/12 has appt tomorrow for this one  No lumps on self exam    hyst in past - for fibroid and endometriosis No hx of abn paps  Does not need further paps  No gyn problems   dexa 9/09 with improved osteopenia LS -2.4 T and FN -1.1 Had another last year 2012 -- then due in 1 more year  Is taking her ca and vit D    Lipids - diet controlled Lab Results  Component Value Date   CHOL 222* 11/04/2011   CHOL 213* 08/06/2010   CHOL 254* 07/31/2009   Lab Results  Component Value Date   HDL 57.70 11/04/2011   HDL 53.20 08/06/2010   HDL 65.78 07/31/2009   No results found for this basename: LDLCALC   Lab Results  Component Value Date   TRIG 125.0 11/04/2011   TRIG 79.0 08/06/2010   TRIG 138.0 07/31/2009   Lab Results  Component Value Date   CHOLHDL 4 11/04/2011   CHOLHDL 4 08/06/2010   CHOLHDL 5 07/31/2009   Lab Results  Component Value Date   LDLDIRECT 149.1 11/04/2011   LDLDIRECT 139.3 08/06/2010   LDLDIRECT 184.1 07/31/2009   LDL up to 149 this draw High chol in her family- thinks her father had it  Diet is excellent  Not enough exercise since knee injury in July      Chemistry      Component Value Date/Time   NA 135 11/04/2011 0914   K 4.0 11/04/2011 0914   CL 103 11/04/2011 0914   CO2 26 11/04/2011 0914   BUN 11 11/04/2011 0914   CREATININE 0.6 11/04/2011 0914      Component  Value Date/Time   CALCIUM 9.5 11/04/2011 0914   ALKPHOS 64 11/04/2011 0914   AST 19 11/04/2011 0914   ALT 19 11/04/2011 0914   BILITOT 0.6 11/04/2011 0914      Other labs ok   Patient Active Problem List  Diagnoses  . HYPERLIPIDEMIA  . ALLERGIC RHINITIS  . ASTHMA  . ARTHRALGIA  . DISC DISEASE, LUMBAR  . Osteopenia  . MURMUR  . Joint pain  . Hand pain  . Tick bite  . Knee pain   Past Medical History  Diagnosis Date  . Allergy     allergic rhinitis  . Asthma   . Hyperlipidemia   . Osteoporosis   . Osteopenia   . DDD (degenerative disc disease)     low back pain deg disc dz with infections  . Tinnitus   . Hearing loss     mild   Past Surgical History  Procedure Date  . Abdominal hysterectomy 1989    total, fibroids  . Torn meniscus 2007  left   . Knee cartilage surgery    History  Substance Use Topics  . Smoking status: Never Smoker   . Smokeless tobacco: Not on file  . Alcohol Use: No   Family History  Problem Relation Age of Onset  . Osteoporosis Mother   . Cancer Mother     ? liver cancer ? primary  . Heart disease Father     MI  . Osteoporosis Sister   . Diabetes Sister   . Heart disease Sister     CHF  . Heart disease Brother     CAD  . Heart disease Brother     CAD  . Osteoporosis Sister   . Diabetes Sister    Allergies  Allergen Reactions  . Alendronate Sodium     REACTION: GI  . Azithromycin     REACTION: GI upset  . Clarithromycin     REACTION: stomach upset  . Codeine     REACTION: nausea and vomiting  . Diclofenac Sodium     REACTION: hands itching  . Fish Oil     REACTION: reflux  . Meloxicam Itching    Hands itched. And urine was rusty colored.  [name] Penicillins     REACTION: mouth swelling  . Rabeprazole Sodium     REACTION: GI  . Raloxifene     REACTION: leg pain and cramps  . Sulfonamide Derivatives     REACTION: rash   Current Outpatient Prescriptions on File Prior to Visit  Medication Sig Dispense Refill  .  CALCIUM-VITAMIN D PO Take 1,200 mg by mouth daily. as directed       . fluticasone (FLONASE) 50 MCG/ACT nasal spray Place 2 sprays into the nose daily.        . Fluticasone-Salmeterol (ADVAIR DISKUS) 100-50 MCG/DOSE AEPB Inhale 1 puff into the lungs every 12 (twelve) hours.        [name] albuterol (PROVENTIL,VENTOLIN) 90 MCG/ACT inhaler as directed.        [name] dextromethorphan (DELSYM) 30 MG/5ML liquid Take 60 mg by mouth as needed.        . fexofenadine (ALLEGRA ODT) 30 MG disintegrating tablet Take 60 mg by mouth daily as needed.            Review of Systems Review of Systems  Constitutional: Negative for fever, appetite change, fatigue and unexpected weight change.  Eyes: Negative for pain and visual disturbance.  Respiratory: Negative for cough and shortness of breath.   Cardiovascular: Negative for cp or palpitations    Gastrointestinal: Negative for nausea, diarrhea and constipation.  Genitourinary: Negative for urgency and frequency.  Skin: Negative for pallor or rash   MSK pos for bilat knee pain, neg for joint redness or swelling  Neurological: Negative for weakness, light-headedness, numbness and headaches.  Hematological: Negative for adenopathy. Does not bruise/bleed easily.  Psychiatric/Behavioral: Negative for dysphoric mood. The patient is not nervous/anxious.          Objective:   Physical Exam  Constitutional: She appears well-developed and well-nourished. No distress.       overwt and well appearing   HENT:  Head: Normocephalic and atraumatic.  Right Ear: External ear normal.  Left Ear: External ear normal.  Nose: Nose normal.  Mouth/Throat: Oropharynx is clear and moist.  Eyes: Conjunctivae and EOM are normal. Pupils are equal, round, and reactive to light. No scleral icterus.  Neck: Normal range of motion. Neck supple. No JVD present. Carotid bruit is not present. Erythema present. No thyromegaly  present.  Cardiovascular: Normal rate, regular rhythm, normal heart  sounds and intact distal pulses.  Exam reveals no gallop.   Pulmonary/Chest: Effort normal and breath sounds normal. No respiratory distress. She has no wheezes.  Abdominal: Soft. Bowel sounds are normal. She exhibits no distension, no abdominal bruit and no mass. There is no tenderness.  Genitourinary: No breast swelling, tenderness, discharge or bleeding.       Breast exam: No mass, nodules, thickening, tenderness, bulging, retraction, inflamation, nipple discharge or skin changes noted.  No axillary or clavicular LA.  Chaperoned exam.    Musculoskeletal: She exhibits tenderness. She exhibits no edema.       Poor rom both knees with affected gait   Lymphadenopathy:    She has no cervical adenopathy.  Neurological: She is alert. She has normal reflexes. No cranial nerve deficit. Coordination normal.  Skin: Skin is warm and dry. No rash noted. No erythema. No pallor.  Psychiatric: She has a normal mood and affect.          Assessment & Plan:

## 2011-11-12 ENCOUNTER — Ambulatory Visit
Admission: RE | Admit: 2011-11-12 | Discharge: 2011-11-12 | Disposition: A | Discharge status: Home / Self Care | Payer: Medicare Other | Source: Ambulatory Visit | Attending: Family Medicine | Admitting: Family Medicine

## 2011-11-12 DIAGNOSIS — Z1231 Encounter for screening mammogram for malignant neoplasm of breast: Secondary | ICD-10-CM

## 2011-11-19 ENCOUNTER — Encounter: Payer: Self-pay | Admitting: *Deleted

## 2012-02-13 ENCOUNTER — Ambulatory Visit: Payer: Self-pay | Admitting: General Practice

## 2012-04-07 ENCOUNTER — Encounter: Payer: Self-pay | Admitting: Family Medicine

## 2012-05-10 ENCOUNTER — Other Ambulatory Visit (INDEPENDENT_AMBULATORY_CARE_PROVIDER_SITE_OTHER): Payer: Medicare Other

## 2012-05-10 DIAGNOSIS — E785 Hyperlipidemia, unspecified: Secondary | ICD-10-CM

## 2012-05-11 LAB — AST: AST: 17 U/L (ref 0–37)

## 2012-05-11 LAB — LIPID PANEL
Cholesterol: 220 mg/dL — ABNORMAL HIGH (ref 0–200)
HDL: 56.4 mg/dL (ref >39.00)
Total CHOL/HDL Ratio: 4
Triglycerides: 124 mg/dL (ref 0.0–149.0)
VLDL: 24.8 mg/dL (ref 0.0–40.0)

## 2012-05-11 LAB — LDL CHOLESTEROL, DIRECT: Direct LDL: 149.5 mg/dL

## 2012-05-11 LAB — ALT: ALT: 15 U/L (ref 0–35)

## 2012-05-14 ENCOUNTER — Ambulatory Visit (INDEPENDENT_AMBULATORY_CARE_PROVIDER_SITE_OTHER): Payer: Medicare Other | Admitting: Family Medicine

## 2012-05-14 ENCOUNTER — Encounter: Payer: Self-pay | Admitting: Family Medicine

## 2012-05-14 VITALS — BP 126/78 | HR 76 | Temp 98.2°F | Ht 63.0 in | Wt 181.2 lb

## 2012-05-14 DIAGNOSIS — E785 Hyperlipidemia, unspecified: Secondary | ICD-10-CM

## 2012-05-14 DIAGNOSIS — M2550 Pain in unspecified joint: Secondary | ICD-10-CM

## 2012-05-14 NOTE — Patient Instructions (Addendum)
Please think about whether you would take cholesterol medicine - I would recommend generic lipitor  Avoid red meat/ fried foods/ egg yolks/ fatty breakfast meats/ butter, cheese and high fat dairy/ and shellfish   Also really work on weight loss - this will help chronic pain in knees and feet tremendously  Follow up in 6 months for annual exam with labs prior

## 2012-05-14 NOTE — Assessment & Plan Note (Signed)
This is likely genetic but pt is hesitant to start medication Handouts given Disc goals for lipids and reasons to control them Rev labs with pt Rev low sat fat diet in detail F/u 6 mo

## 2012-05-14 NOTE — Progress Notes (Signed)
Subjective:    Patient ID: Jody Avery, female    DOB: 03/02/1946, 66 y.o.   MRN: [id number]  HPI Here for f/u of hyperlipidemia  Is feeling pretty good    Lab Results  Component Value Date   CHOL 220* 05/10/2012   CHOL 222* 11/04/2011   CHOL 213* 08/06/2010   Lab Results  Component Value Date   HDL 56.40 05/10/2012   HDL 57.70 11/04/2011   HDL 53.20 08/06/2010   No results found for this basename: [facility]   Lab Results  Component Value Date   TRIG 124.0 05/10/2012   TRIG 125.0 11/04/2011   TRIG 79.0 08/06/2010   Lab Results  Component Value Date   CHOLHDL 4 05/10/2012   CHOLHDL 4 11/04/2011   CHOLHDL 4 08/06/2010   Lab Results  Component Value Date   LDLDIRECT 149.5 05/10/2012   LDLDIRECT 149.1 11/04/2011   LDLDIRECT 139.3 08/06/2010    Last visit decided to work harder on diet He has really struggled with diet -- 2 surgeries this year, and also was in rehab eating their food Is still not able to exercise much Just got back in the water  Can walk a little  She has really watched satfats- stopped biscuits and high fat bkfast  Eats some chips that are fried   More and more foot/ leg/ knee problems and she is very frustrated   Wt is up 6 lb with bmi of 32  Pharmacy is unable to get miacalcin -- ? If it was causing more pain  Feels better off of it ?   Patient Active Problem List  Diagnosis  . HYPERLIPIDEMIA  . ALLERGIC RHINITIS  . ASTHMA  . ARTHRALGIA  . DISC DISEASE, LUMBAR  . Osteopenia  . MURMUR  . Joint pain  . Hand pain  . Tick bite  . Knee pain   Past Medical History  Diagnosis Date  . Allergy     allergic rhinitis  . Asthma   . Hyperlipidemia   . Osteoporosis   . Osteopenia   . DDD (degenerative disc disease)     low back pain deg disc dz with infections  . Tinnitus   . Hearing loss     mild   Past Surgical History  Procedure Date  . Abdominal hysterectomy 1989    total, fibroids  . Torn meniscus 2007    left   . Knee cartilage surgery     History  Substance Use Topics  . Smoking status: Never Smoker   . Smokeless tobacco: Not on file  . Alcohol Use: No   Family History  Problem Relation Age of Onset  . Osteoporosis Mother   . Cancer Mother     ? liver cancer ? primary  . Heart disease Father     MI  . Osteoporosis Sister   . Diabetes Sister   . Heart disease Sister     CHF  . Heart disease Brother     CAD  . Heart disease Brother     CAD  . Osteoporosis Sister   . Diabetes Sister    Allergies  Allergen Reactions  . Alendronate Sodium     REACTION: GI  . Azithromycin     REACTION: GI upset  . Clarithromycin     REACTION: stomach upset  . Codeine     REACTION: nausea and vomiting  . Diclofenac Sodium     REACTION: hands itching  . Fish Oil     REACTION:  reflux  . Meloxicam Itching    Hands itched. And urine was rusty colored.  [name] Penicillins     REACTION: mouth swelling  . Rabeprazole Sodium     REACTION: GI  . Raloxifene     REACTION: leg pain and cramps  . Sulfonamide Derivatives     REACTION: rash   Current Outpatient Prescriptions on File Prior to Visit  Medication Sig Dispense Refill  . albuterol (PROVENTIL,VENTOLIN) 90 MCG/ACT inhaler as directed.        [name] CALCIUM-VITAMIN D PO Take 1,200 mg by mouth daily. as directed       . dextromethorphan (DELSYM) 30 MG/5ML liquid Take 60 mg by mouth as needed.        . fexofenadine (ALLEGRA ODT) 30 MG disintegrating tablet Take 60 mg by mouth daily as needed.        . fluticasone (FLONASE) 50 MCG/ACT nasal spray Place 2 sprays into the nose daily.        . Fluticasone-Salmeterol (ADVAIR DISKUS) 100-50 MCG/DOSE AEPB Inhale 1 puff into the lungs every 12 (twelve) hours.        . calcitonin, salmon, (MIACALCIN/FORTICAL) 200 UNIT/ACT nasal spray Place 1 spray into the nose daily. Alternate nostils  3.7 mL  11     Patient Active Problem List  Diagnosis  . HYPERLIPIDEMIA  . ALLERGIC RHINITIS  . ASTHMA  . ARTHRALGIA  . DISC DISEASE, LUMBAR  .  Osteopenia  . MURMUR  . Joint pain  . Hand pain  . Tick bite  . Knee pain   Past Medical History  Diagnosis Date  . Allergy     allergic rhinitis  . Asthma   . Hyperlipidemia   . Osteoporosis   . Osteopenia   . DDD (degenerative disc disease)     low back pain deg disc dz with infections  . Tinnitus   . Hearing loss     mild   Past Surgical History  Procedure Date  . Abdominal hysterectomy 1989    total, fibroids  . Torn meniscus 2007    left   . Knee cartilage surgery    History  Substance Use Topics  . Smoking status: Never Smoker   . Smokeless tobacco: Not on file  . Alcohol Use: No   Family History  Problem Relation Age of Onset  . Osteoporosis Mother   . Cancer Mother     ? liver cancer ? primary  . Heart disease Father     MI  . Osteoporosis Sister   . Diabetes Sister   . Heart disease Sister     CHF  . Heart disease Brother     CAD  . Heart disease Brother     CAD  . Osteoporosis Sister   . Diabetes Sister    Allergies  Allergen Reactions  . Alendronate Sodium     REACTION: GI  . Azithromycin     REACTION: GI upset  . Clarithromycin     REACTION: stomach upset  . Codeine     REACTION: nausea and vomiting  . Diclofenac Sodium     REACTION: hands itching  . Fish Oil     REACTION: reflux  . Meloxicam Itching    Hands itched. And urine was rusty colored.  [name] Penicillins     REACTION: mouth swelling  . Rabeprazole Sodium     REACTION: GI  . Raloxifene     REACTION: leg pain and cramps  . Sulfonamide Derivatives  REACTION: rash   Current Outpatient Prescriptions on File Prior to Visit  Medication Sig Dispense Refill  . albuterol (PROVENTIL,VENTOLIN) 90 MCG/ACT inhaler as directed.        [name] CALCIUM-VITAMIN D PO Take 1,200 mg by mouth daily. as directed       . dextromethorphan (DELSYM) 30 MG/5ML liquid Take 60 mg by mouth as needed.        . fexofenadine (ALLEGRA ODT) 30 MG disintegrating tablet Take 60 mg by mouth daily as needed.         . fluticasone (FLONASE) 50 MCG/ACT nasal spray Place 2 sprays into the nose daily.        . Fluticasone-Salmeterol (ADVAIR DISKUS) 100-50 MCG/DOSE AEPB Inhale 1 puff into the lungs every 12 (twelve) hours.        . calcitonin, salmon, (MIACALCIN/FORTICAL) 200 UNIT/ACT nasal spray Place 1 spray into the nose daily. Alternate nostils  3.7 mL  11      Review of Systems    Review of Systems  Constitutional: Negative for fever, appetite change, fatigue and unexpected weight change.  Eyes: Negative for pain and visual disturbance.  Respiratory: Negative for cough and shortness of breath.   Cardiovascular: Negative for cp or palpitations    Gastrointestinal: Negative for nausea, diarrhea and constipation.  Genitourinary: Negative for urgency and frequency.  Skin: Negative for pallor or rash   MSK pos for chronic joint pain and stiffness, neg for swollen joints  Neurological: Negative for weakness, light-headedness, numbness and headaches.  Hematological: Negative for adenopathy. Does not bruise/bleed easily.  Psychiatric/Behavioral: Negative for dysphoric mood. The patient is not nervous/anxious.      Objective:   Physical Exam  Constitutional: She appears well-developed and well-nourished. No distress.       obese and well appearing   HENT:  Head: Normocephalic and atraumatic.  Mouth/Throat: Oropharynx is clear and moist.  Eyes: Conjunctivae normal and EOM are normal. Pupils are equal, round, and reactive to light. Right eye exhibits no discharge. Left eye exhibits no discharge. No scleral icterus.  Neck: Normal range of motion. Neck supple. No JVD present. Carotid bruit is not present. No thyromegaly present.  Cardiovascular: Normal rate, regular rhythm, normal heart sounds and intact distal pulses.  Exam reveals no gallop.   Pulmonary/Chest: Effort normal and breath sounds normal. No respiratory distress. She has no wheezes.  Abdominal: Soft. Bowel sounds are normal. She exhibits  no distension, no abdominal bruit and no mass. There is no tenderness.  Musculoskeletal: Normal range of motion. She exhibits tenderness. She exhibits no edema.       Tenderness in knees and ankles No joint swelling   Lymphadenopathy:    She has no cervical adenopathy.  Neurological: She is alert. She has normal reflexes. No cranial nerve deficit. She exhibits normal muscle tone. Coordination normal.  Skin: Skin is warm and dry. No rash noted. No erythema. No pallor.  Psychiatric: She has a normal mood and affect.          Assessment & Plan:

## 2012-05-14 NOTE — Assessment & Plan Note (Signed)
Lots of trouble with OA in knees and feet  Pt is going to try water exercise and wt loss to help that

## 2012-06-07 ENCOUNTER — Telehealth: Payer: Self-pay

## 2012-06-07 NOTE — Telephone Encounter (Signed)
Pt got calcitonin salmon 200 units today from pharmacy and 1st time a print out said pts being treated long term with this med showed rare risk of developing cancer. Pt wants to know what that means. Pharmacy referred to doctor.Please advise.

## 2012-06-08 NOTE — Telephone Encounter (Signed)
Advise pt of what Dr. Karie Schwalbe reccommended pt said since there wasn't a big risk of cancer she will keep taking it at least till she has her next DEXA scan

## 2012-06-08 NOTE — Telephone Encounter (Signed)
I investigated this -and do not have a good reason why - but there is a rare increase in cancer risk - less than 1% I could not find the mechanism or reason for this , however  If she feels uncomfortable taking the med for this reason - of course, tell her to stop it - and take off her med list

## 2012-08-16 ENCOUNTER — Ambulatory Visit: Payer: Self-pay | Admitting: General Practice

## 2012-10-18 ENCOUNTER — Telehealth: Payer: Self-pay

## 2012-10-18 DIAGNOSIS — M8580 Other specified disorders of bone density and structure, unspecified site: Secondary | ICD-10-CM

## 2012-10-18 DIAGNOSIS — Z1231 Encounter for screening mammogram for malignant neoplasm of breast: Secondary | ICD-10-CM | POA: Insufficient documentation

## 2012-10-18 NOTE — Telephone Encounter (Signed)
Left voicemail requesting pt to call our office 

## 2012-10-18 NOTE — Telephone Encounter (Signed)
Pt left v/m; pt got notice time for mammogram and in notice  pt could get 3 D mammogram which can increase detection of breast problem; pt wants Dr Royden Purl advice if she needs 3 D mammogram. Pt also request order for bone density.Please advise.Pt request call back.

## 2012-10-18 NOTE — Telephone Encounter (Signed)
Orders for mammo and [facility] at [facility]

## 2012-10-18 NOTE — Telephone Encounter (Signed)
I think she gets both at the breast center - please confirm that and I will order Generally 3D is recommended for pts with very dense breast tissue-her last mammogram did not note that her breasts were especially dense -so she probably does not need that

## 2012-10-18 NOTE — Telephone Encounter (Signed)
advise pt that Dr. Milinda Antis doesn't think she needs a 3D mammo pt verbalized understanding, pt does go to the [facility], Pt advise that Dr. Milinda Antis will put referral in and Shirlee Limerick will call back to set up appts

## 2012-11-08 ENCOUNTER — Other Ambulatory Visit: Payer: Medicare Other

## 2012-11-12 ENCOUNTER — Encounter: Payer: Medicare Other | Admitting: Family Medicine

## 2012-12-06 ENCOUNTER — Ambulatory Visit
Admission: RE | Admit: 2012-12-06 | Discharge: 2012-12-06 | Disposition: A | Discharge status: Home / Self Care | Payer: Medicare Other | Source: Ambulatory Visit | Attending: Family Medicine | Admitting: Family Medicine

## 2012-12-06 DIAGNOSIS — M8580 Other specified disorders of bone density and structure, unspecified site: Secondary | ICD-10-CM

## 2012-12-06 DIAGNOSIS — Z1231 Encounter for screening mammogram for malignant neoplasm of breast: Secondary | ICD-10-CM

## 2012-12-06 LAB — HM DEXA SCAN

## 2012-12-07 ENCOUNTER — Encounter: Payer: Self-pay | Admitting: *Deleted

## 2012-12-07 ENCOUNTER — Encounter: Payer: Self-pay | Admitting: Family Medicine

## 2012-12-09 ENCOUNTER — Telehealth: Payer: Self-pay

## 2012-12-09 NOTE — Telephone Encounter (Signed)
Pt received letter re: bone density; pt will discuss further with Dr Milinda Antis at her next appt but pt wants to know how much worse the bone density is now than last report. Pt has not taken calcitonin nasal spray since Oct 2013; pt was concerned about possible side effect that could cause CA, pt also had joint aching and wanted to see if stopping med would help joint pain, pt said her joints are not hurting as badly since stopped calcitonin and "she feels better in general". Pt wants to know would stopping med make that much difference in the bone density results? Pt also restarted calcitonin again this morning.Please advise.

## 2012-12-09 NOTE — Telephone Encounter (Signed)
The nasal spray does not make a big difference so definitely stop it if it is causing side effects  Please mail her a copy of her bone density report

## 2012-12-10 NOTE — Telephone Encounter (Signed)
Pt notified to stop the med, med list updated and copy of bone density report mailed to pt

## 2012-12-20 ENCOUNTER — Ambulatory Visit: Payer: Medicare Other

## 2012-12-20 ENCOUNTER — Other Ambulatory Visit: Payer: Medicare Other

## 2013-02-13 ENCOUNTER — Telehealth: Payer: Self-pay | Admitting: Family Medicine

## 2013-02-13 DIAGNOSIS — E785 Hyperlipidemia, unspecified: Secondary | ICD-10-CM

## 2013-02-13 DIAGNOSIS — Z0000 Encounter for general adult medical examination without abnormal findings: Secondary | ICD-10-CM | POA: Insufficient documentation

## 2013-02-13 DIAGNOSIS — M8580 Other specified disorders of bone density and structure, unspecified site: Secondary | ICD-10-CM

## 2013-02-13 NOTE — Telephone Encounter (Signed)
Message copied by Judy Pimple on Sun Feb 13, 2013  1:50 PM ------      Message from: Baldomero Lamy      Created: Wed Feb 09, 2013  1:31 PM      Regarding: Cpx labs Mon 6/16       Please order  future cpx labs for pt's upcoming lab appt.      Thanks      Tasha       ------

## 2013-02-14 ENCOUNTER — Other Ambulatory Visit (INDEPENDENT_AMBULATORY_CARE_PROVIDER_SITE_OTHER): Payer: Medicare Other

## 2013-02-14 DIAGNOSIS — M899 Disorder of bone, unspecified: Secondary | ICD-10-CM

## 2013-02-14 DIAGNOSIS — E785 Hyperlipidemia, unspecified: Secondary | ICD-10-CM

## 2013-02-14 DIAGNOSIS — M8580 Other specified disorders of bone density and structure, unspecified site: Secondary | ICD-10-CM

## 2013-02-14 DIAGNOSIS — Z0000 Encounter for general adult medical examination without abnormal findings: Secondary | ICD-10-CM

## 2013-02-14 LAB — CBC WITH DIFFERENTIAL/PLATELET
Basophils Absolute: 0.1 10*3/uL (ref 0.0–0.1)
Basophils Relative: 1 % (ref 0.0–3.0)
Eosinophils Absolute: 0.2 10*3/uL (ref 0.0–0.7)
Eosinophils Relative: 3.9 % (ref 0.0–5.0)
HCT: 41.9 % (ref 36.0–46.0)
Hemoglobin: 13.9 g/dL (ref 12.0–15.0)
Lymphocytes Relative: 32.4 % (ref 12.0–46.0)
Lymphs Abs: 1.7 10*3/uL (ref 0.7–4.0)
MCHC: 33.2 g/dL (ref 30.0–36.0)
MCV: 91 fl (ref 78.0–100.0)
Monocytes Absolute: 0.6 10*3/uL (ref 0.1–1.0)
Monocytes Relative: 11.4 % (ref 3.0–12.0)
Neutro Abs: 2.7 10*3/uL (ref 1.4–7.7)
Neutrophils Relative %: 51.3 % (ref 43.0–77.0)
Platelets: 202 10*3/uL (ref 150.0–400.0)
RBC: 4.61 Mil/uL (ref 3.87–5.11)
RDW: 14.2 % (ref 11.5–14.6)
WBC: 5.3 10*3/uL (ref 4.5–10.5)

## 2013-02-14 LAB — COMPREHENSIVE METABOLIC PANEL
ALT: 18 U/L (ref 0–35)
AST: 22 U/L (ref 0–37)
Albumin: 4.1 g/dL (ref 3.5–5.2)
Alkaline Phosphatase: 58 U/L (ref 39–117)
BUN: 13 mg/dL (ref 6–23)
CO2: 28 mEq/L (ref 19–32)
Calcium: 9.6 mg/dL (ref 8.4–10.5)
Chloride: 104 mEq/L (ref 96–112)
Creatinine, Ser: 0.8 mg/dL (ref 0.4–1.2)
GFR: 80.21 mL/min (ref >60.00)
Glucose, Bld: 86 mg/dL (ref 70–99)
Potassium: 4.4 mEq/L (ref 3.5–5.1)
Sodium: 140 mEq/L (ref 135–145)
Total Bilirubin: 0.7 mg/dL (ref 0.3–1.2)
Total Protein: 7.2 g/dL (ref 6.0–8.3)

## 2013-02-14 LAB — LIPID PANEL
Cholesterol: 206 mg/dL — ABNORMAL HIGH (ref 0–200)
HDL: 58.8 mg/dL (ref >39.00)
Total CHOL/HDL Ratio: 4
Triglycerides: 104 mg/dL (ref 0.0–149.0)
VLDL: 20.8 mg/dL (ref 0.0–40.0)

## 2013-02-14 LAB — TSH: TSH: 2.17 u[IU]/mL (ref 0.35–5.50)

## 2013-02-14 LAB — LDL CHOLESTEROL, DIRECT: Direct LDL: 137 mg/dL

## 2013-02-15 LAB — VITAMIN D 25 HYDROXY (VIT D DEFICIENCY, FRACTURES): Vit D, 25-Hydroxy: 36 ng/mL (ref 30–89)

## 2013-02-16 ENCOUNTER — Ambulatory Visit (INDEPENDENT_AMBULATORY_CARE_PROVIDER_SITE_OTHER): Payer: Medicare Other | Admitting: Family Medicine

## 2013-02-16 ENCOUNTER — Encounter: Payer: Self-pay | Admitting: Family Medicine

## 2013-02-16 VITALS — BP 142/86 | HR 73 | Temp 98.2°F | Ht 62.75 in | Wt 184.5 lb

## 2013-02-16 DIAGNOSIS — Z0000 Encounter for general adult medical examination without abnormal findings: Secondary | ICD-10-CM

## 2013-02-16 DIAGNOSIS — E785 Hyperlipidemia, unspecified: Secondary | ICD-10-CM

## 2013-02-16 DIAGNOSIS — M949 Disorder of cartilage, unspecified: Secondary | ICD-10-CM

## 2013-02-16 DIAGNOSIS — M8580 Other specified disorders of bone density and structure, unspecified site: Secondary | ICD-10-CM

## 2013-02-16 DIAGNOSIS — M899 Disorder of bone, unspecified: Secondary | ICD-10-CM

## 2013-02-16 NOTE — Patient Instructions (Addendum)
Here is information on prolia (another medicine for osteoporosis if you would like to consider it )  Labs look ok and cholesterol is improved Keep up the good exercise

## 2013-02-16 NOTE — Progress Notes (Signed)
Subjective:    Patient ID: Jody Avery, female    DOB: [birth year], 67 y.o.   MRN: [id number]  HPI I have personally reviewed the Medicare Annual Wellness questionnaire and have noted 1. The patient's medical and social history 2. Their use of alcohol, tobacco or illicit drugs 3. Their current medications and supplements 4. The patient's functional ability including ADL's, fall risks, home safety risks and hearing or visual             impairment. 5. Diet and physical activities 6. Evidence for depression or mood disorders  The patients weight, height, BMI have been recorded in the chart and visual acuity is per eye clinic.  I have made referrals, counseling and provided education to the patient based review of the above and I have provided the pt with a written personalized care plan for preventive services.  See scanned forms.  Routine anticipatory guidance given to patient.  See health maintenance. Flu had a shot this season  Shingles vaccine in 2008 PNA vaccine 11/09 (had a reaction to it )  Tetanus 3/12 vaccine  Colon 7/13 done  Breast cancer screening 4/14  No lumps on self exam  Has had a hysterectomy - no gyn problems  Advance directive - has a living will and working on [employer]  Cognitive function addressed- see scanned forms- and if abnormal then additional documentation follows.  No worries about this   PMH and SH reviewed  Meds, vitals, and allergies reviewed.   ROS: See HPI.  Otherwise negative.    Falls-none at all  No fractures   Mood-is fine-no depression/ and stays motivated   Lab Results  Component Value Date   CHOL 206* 02/14/2013   CHOL 220* 05/10/2012   CHOL 222* 11/04/2011   Lab Results  Component Value Date   HDL 58.80 02/14/2013   HDL 40.98 05/10/2012   HDL 57.70 11/04/2011   No results found for this basename: [facility]   Lab Results  Component Value Date   TRIG 104.0 02/14/2013   TRIG 124.0 05/10/2012   TRIG 125.0 11/04/2011   Lab Results   Component Value Date   CHOLHDL 4 02/14/2013   CHOLHDL 4 05/10/2012   CHOLHDL 4 11/04/2011   Lab Results  Component Value Date   LDLDIRECT 137.0 02/14/2013   LDLDIRECT 149.5 05/10/2012   LDLDIRECT 149.1 11/04/2011   has improved LDL and eating a healthy diet   Struggles with her weight - looses and then gains  Lost weight at [employer] and then gained it back  Does not eat a lot of meat - has issues getting enough protein  Eats some nuts  Is exercise - water exercise - at least 4 times per week 1.5 hours , limited walking due to knee problems   OP Intol to miacalcin and bisphosphenates and evista  dexa worse in spine  On ca and D  She tends to have reactions to most medicines   Patient Active Problem List   Diagnosis Date Noted  . Encounter for Medicare annual wellness exam 02/13/2013  . Other screening mammogram 10/18/2012  . Knee pain 11/11/2011  . Tick bite 03/17/2011  . Hand pain 03/14/2011  . Joint pain 03/11/2011  . ARTHRALGIA 07/31/2009  . DISC DISEASE, LUMBAR 04/15/2007  . HYPERLIPIDEMIA 04/06/2007  . ALLERGIC RHINITIS 04/06/2007  . ASTHMA 04/06/2007  . Osteopenia 04/06/2007  . MURMUR 04/06/2007   Past Medical History  Diagnosis Date  . Allergy     allergic rhinitis  .  Asthma   . Hyperlipidemia   . Osteoporosis   . Osteopenia   . DDD (degenerative disc disease)     low back pain deg disc dz with infections  . Tinnitus   . Hearing loss     mild   Past Surgical History  Procedure Laterality Date  . Abdominal hysterectomy  1989    total, fibroids  . Torn meniscus  2007    left   . Knee cartilage surgery     History  Substance Use Topics  . Smoking status: Never Smoker   . Smokeless tobacco: Not on file  . Alcohol Use: No   Family History  Problem Relation Age of Onset  . Osteoporosis Mother   . Cancer Mother     ? liver cancer ? primary  . Heart disease Father     MI  . Osteoporosis Sister   . Diabetes Sister   . Heart disease Sister     CHF   . Heart disease Brother     CAD  . Heart disease Brother     CAD  . Osteoporosis Sister   . Diabetes Sister    Allergies  Allergen Reactions  . Alendronate Sodium     REACTION: GI  . Azithromycin     REACTION: GI upset  . Clarithromycin     REACTION: stomach upset  . Codeine     REACTION: nausea and vomiting  . Diclofenac Sodium     REACTION: hands itching  . Fish Oil     REACTION: reflux  . Meloxicam Itching    Hands itched. And urine was rusty colored.  [name] Penicillins     REACTION: mouth swelling  . Rabeprazole Sodium     REACTION: GI  . Raloxifene     REACTION: leg pain and cramps  . Sulfonamide Derivatives     REACTION: rash   Current Outpatient Prescriptions on File Prior to Visit  Medication Sig Dispense Refill  . albuterol (PROVENTIL,VENTOLIN) 90 MCG/ACT inhaler Inhale into the lungs as needed.       . Fluticasone-Salmeterol (ADVAIR DISKUS) 100-50 MCG/DOSE AEPB Inhale 1 puff into the lungs every 12 (twelve) hours.        . fexofenadine (ALLEGRA ODT) 30 MG disintegrating tablet Take 60 mg by mouth daily as needed.        . fluticasone (FLONASE) 50 MCG/ACT nasal spray Place 2 sprays into the nose daily.         No current facility-administered medications on file prior to visit.     Review of Systems Review of Systems  Constitutional: Negative for fever, appetite change, fatigue and unexpected weight change.  Eyes: Negative for pain and visual disturbance.  Respiratory: Negative for cough and shortness of breath.   Cardiovascular: Negative for cp or palpitations    Gastrointestinal: Negative for nausea, diarrhea and constipation.  Genitourinary: Negative for urgency and frequency.  Skin: Negative for pallor or rash   Neurological: Negative for weakness, light-headedness, numbness and headaches.  Hematological: Negative for adenopathy. Does not bruise/bleed easily.  Psychiatric/Behavioral: Negative for dysphoric mood. The patient is not nervous/anxious.          Objective:   Physical Exam  Constitutional: She appears well-developed and well-nourished. No distress.  obese and well appearing   HENT:  Head: Normocephalic and atraumatic.  Right Ear: External ear normal.  Left Ear: External ear normal.  Nose: Nose normal.  Mouth/Throat: Oropharynx is clear and moist.  Eyes: Conjunctivae and EOM are  normal. Pupils are equal, round, and reactive to light. Right eye exhibits no discharge. Left eye exhibits no discharge. No scleral icterus.  Neck: Normal range of motion. Neck supple. No JVD present. Carotid bruit is not present. No thyromegaly present.  Cardiovascular: Normal rate, regular rhythm, normal heart sounds and intact distal pulses.  Exam reveals no gallop.   Pulmonary/Chest: Effort normal and breath sounds normal. No respiratory distress. She has no wheezes. She exhibits no tenderness.  Abdominal: Soft. Bowel sounds are normal. She exhibits no distension, no abdominal bruit and no mass. There is no tenderness.  Genitourinary: No breast swelling, tenderness, discharge or bleeding.  Breast exam: No mass, nodules, thickening, tenderness, bulging, retraction, inflamation, nipple discharge or skin changes noted.  No axillary or clavicular LA.  Chaperoned exam.    Musculoskeletal: Normal range of motion. She exhibits no edema and no tenderness.  Lymphadenopathy:    She has no cervical adenopathy.  Neurological: She is alert. She has normal reflexes. No cranial nerve deficit. She exhibits normal muscle tone. Coordination normal.  Skin: Skin is warm and dry. No rash noted. No erythema. No pallor.  Psychiatric: She has a normal mood and affect.          Assessment & Plan:

## 2013-02-17 NOTE — Assessment & Plan Note (Signed)
Some imp with diet so far Disc goals for lipids and reasons to control them Rev labs with pt Rev low sat fat diet in detail

## 2013-02-17 NOTE — Assessment & Plan Note (Signed)
Worse LS score BMD- pt has been intol of 3 agents so far  She is not likely interested in further tx , but was given info to read on prolia and consider  Disc imp of ca and D and fall prevention in detail

## 2013-02-17 NOTE — Assessment & Plan Note (Signed)
Reviewed health habits including diet and exercise and skin cancer prevention Also reviewed health mt list, fam hx and immunizations  See HPI 

## 2013-08-10 DIAGNOSIS — C4492 Squamous cell carcinoma of skin, unspecified: Secondary | ICD-10-CM

## 2013-08-10 HISTORY — DX: Squamous cell carcinoma of skin, unspecified: C44.92

## 2013-12-21 ENCOUNTER — Other Ambulatory Visit: Payer: Self-pay

## 2013-12-21 DIAGNOSIS — Z1231 Encounter for screening mammogram for malignant neoplasm of breast: Secondary | ICD-10-CM

## 2013-12-29 ENCOUNTER — Ambulatory Visit
Admission: RE | Admit: 2013-12-29 | Discharge: 2013-12-29 | Disposition: A | Discharge status: Home / Self Care | Payer: Medicare PPO | Source: Ambulatory Visit

## 2013-12-29 ENCOUNTER — Encounter (INDEPENDENT_AMBULATORY_CARE_PROVIDER_SITE_OTHER): Payer: Self-pay

## 2013-12-29 DIAGNOSIS — Z1231 Encounter for screening mammogram for malignant neoplasm of breast: Secondary | ICD-10-CM

## 2014-01-03 ENCOUNTER — Encounter: Payer: Self-pay | Admitting: Family Medicine

## 2014-06-27 ENCOUNTER — Telehealth: Payer: Self-pay | Admitting: Family Medicine

## 2014-06-27 DIAGNOSIS — M8580 Other specified disorders of bone density and structure, unspecified site: Secondary | ICD-10-CM

## 2014-06-27 DIAGNOSIS — E785 Hyperlipidemia, unspecified: Secondary | ICD-10-CM

## 2014-06-27 DIAGNOSIS — Z0000 Encounter for general adult medical examination without abnormal findings: Secondary | ICD-10-CM

## 2014-06-27 NOTE — Telephone Encounter (Signed)
Message copied by Abner Greenspan on Tue Jun 27, 2014  5:31 PM ------      Message from: Ellamae Sia      Created: Wed Jun 21, 2014  5:18 PM      Regarding: Lab orders for Wednesday, 10.28.15       Patient is scheduled for CPX labs, please order future labs, Thanks , Terri       ------

## 2014-06-28 ENCOUNTER — Telehealth: Payer: Self-pay | Admitting: Family Medicine

## 2014-06-28 ENCOUNTER — Other Ambulatory Visit (INDEPENDENT_AMBULATORY_CARE_PROVIDER_SITE_OTHER): Payer: Medicare PPO

## 2014-06-28 DIAGNOSIS — M859 Disorder of bone density and structure, unspecified: Secondary | ICD-10-CM | POA: Diagnosis not present

## 2014-06-28 DIAGNOSIS — Z0000 Encounter for general adult medical examination without abnormal findings: Secondary | ICD-10-CM | POA: Diagnosis not present

## 2014-06-28 DIAGNOSIS — M8580 Other specified disorders of bone density and structure, unspecified site: Secondary | ICD-10-CM | POA: Diagnosis not present

## 2014-06-28 DIAGNOSIS — E785 Hyperlipidemia, unspecified: Secondary | ICD-10-CM | POA: Diagnosis not present

## 2014-06-28 LAB — CBC WITH DIFFERENTIAL/PLATELET
Basophils Absolute: 0 10*3/uL (ref 0.0–0.1)
Basophils Relative: 1 % (ref 0.0–3.0)
Eosinophils Absolute: 0.2 10*3/uL (ref 0.0–0.7)
Eosinophils Relative: 4 % (ref 0.0–5.0)
HCT: 42.3 % (ref 36.0–46.0)
Hemoglobin: 14 g/dL (ref 12.0–15.0)
Lymphocytes Relative: 36.1 % (ref 12.0–46.0)
Lymphs Abs: 1.7 10*3/uL (ref 0.7–4.0)
MCHC: 33 g/dL (ref 30.0–36.0)
MCV: 90.2 fl (ref 78.0–100.0)
Monocytes Absolute: 0.5 10*3/uL (ref 0.1–1.0)
Monocytes Relative: 11.2 % (ref 3.0–12.0)
Neutro Abs: 2.3 10*3/uL (ref 1.4–7.7)
Neutrophils Relative %: 47.7 % (ref 43.0–77.0)
Platelets: 187 10*3/uL (ref 150.0–400.0)
RBC: 4.69 Mil/uL (ref 3.87–5.11)
RDW: 13 % (ref 11.5–15.5)
WBC: 4.8 10*3/uL (ref 4.0–10.5)

## 2014-06-28 LAB — COMPREHENSIVE METABOLIC PANEL
ALT: 20 U/L (ref 0–35)
AST: 20 U/L (ref 0–37)
Albumin: 3.6 g/dL (ref 3.5–5.2)
Alkaline Phosphatase: 59 U/L (ref 39–117)
BUN: 10 mg/dL (ref 6–23)
CO2: 22 mEq/L (ref 19–32)
Calcium: 9.7 mg/dL (ref 8.4–10.5)
Chloride: 107 mEq/L (ref 96–112)
Creatinine, Ser: 0.8 mg/dL (ref 0.4–1.2)
GFR: 77.53 mL/min (ref >60.00)
Glucose, Bld: 93 mg/dL (ref 70–99)
Potassium: 4.3 mEq/L (ref 3.5–5.1)
Sodium: 142 mEq/L (ref 135–145)
Total Bilirubin: 0.9 mg/dL (ref 0.2–1.2)
Total Protein: 7.1 g/dL (ref 6.0–8.3)

## 2014-06-28 LAB — VITAMIN D 25 HYDROXY (VIT D DEFICIENCY, FRACTURES): VITD: 27.9 ng/mL — ABNORMAL LOW (ref 30.00–100.00)

## 2014-06-28 LAB — LIPID PANEL
Cholesterol: 198 mg/dL (ref 0–200)
HDL: 50.7 mg/dL (ref >39.00)
LDL Cholesterol: 124 mg/dL — ABNORMAL HIGH (ref 0–99)
NonHDL: 147.3
Total CHOL/HDL Ratio: 4
Triglycerides: 119 mg/dL (ref 0.0–149.0)
VLDL: 23.8 mg/dL (ref 0.0–40.0)

## 2014-06-28 LAB — TSH: TSH: 1.35 u[IU]/mL (ref 0.35–4.50)

## 2014-06-28 NOTE — Telephone Encounter (Signed)
The regular one - I prefer that to the high dose  I have seen a lot of side eff from the high dose one

## 2014-06-28 NOTE — Telephone Encounter (Signed)
Pt came in today to get her labs and wanted to know if she should get the regular flu shot or the super flu shot?

## 2014-06-28 NOTE — Telephone Encounter (Signed)
Pt advised, she verbalized understanding. She plans to wait until her cpx appt next week to get her flu shot.

## 2014-07-05 ENCOUNTER — Encounter: Payer: Self-pay | Admitting: Family Medicine

## 2014-07-05 ENCOUNTER — Ambulatory Visit (INDEPENDENT_AMBULATORY_CARE_PROVIDER_SITE_OTHER): Payer: Medicare PPO | Admitting: Family Medicine

## 2014-07-05 VITALS — BP 124/86 | HR 73 | Temp 98.8°F | Ht 62.75 in | Wt 187.5 lb

## 2014-07-05 DIAGNOSIS — Z23 Encounter for immunization: Secondary | ICD-10-CM

## 2014-07-05 DIAGNOSIS — E785 Hyperlipidemia, unspecified: Secondary | ICD-10-CM

## 2014-07-05 DIAGNOSIS — Z0000 Encounter for general adult medical examination without abnormal findings: Secondary | ICD-10-CM

## 2014-07-05 DIAGNOSIS — M8580 Other specified disorders of bone density and structure, unspecified site: Secondary | ICD-10-CM

## 2014-07-05 NOTE — Patient Instructions (Addendum)
Get your flu shot at [facility] -do not take the high dose shot  prevnar vaccine today  Finish your advanced directive - choose your power of attorney  Increase vitamin D to 3000 iu daily  Take care of herself

## 2014-07-05 NOTE — Progress Notes (Signed)
Subjective:    Patient ID: Jody Avery, female    DOB: [birth year], 68 y.o.   MRN: [id number]  HPI   Here for annual medicare wellness exam and f/u of chronic health problems   I have personally reviewed the Medicare Annual Wellness questionnaire and have noted 1. The patient's medical and social history 2. Their use of alcohol, tobacco or illicit drugs 3. Their current medications and supplements 4. The patient's functional ability including ADL's, fall risks, home safety risks and hearing or visual             impairment. 5. Diet and physical activities 6. Evidence for depression or mood disorders  The patients weight, height, BMI have been recorded in the chart and visual acuity is per eye clinic.  I have made referrals, counseling and provided education to the patient based review of the above and I have provided the pt with a written personalized care plan for preventive services.  Has been doing pretty well- had a good year   See scanned forms.  Routine anticipatory guidance given to patient.  See health maintenance. Colon cancer screening 7/13 -normal  Breast cancer screening 4/15 - normal  Self breast exam- no lumps  No gyn problems  Flu vaccine- has not had it yet - [facility]  Tetanus vaccine 3/12 Pneumovax 11/09 , wants to get the prevnar vaccine  Zoster vaccine 12/08   Advance directive-is almost complete- she is finishing it now and designating a POA -will finish it  Cognitive function addressed- see scanned forms- and if abnormal then additional documentation follows.  No concerns about it other than "careless keys" also her concentration is good   PMH and SH reviewed  Meds, vitals, and allergies reviewed.   ROS: See HPI.  Otherwise negative.     Osteoporosis  4/14 dexa  Has already been intolerant to 3 meds Decided against prolia - read about it and worried about side effects  Had one fall - slipped on a plastic bag in a classroom -no  fractures  Thinks it was a freak occurrence and that her balance is good  D level 27.9  She takes 1000 iu per day - will increase it  Has been exercising and also done some house renovations as well -[facility] Limited walking due to knees - supplements with water exercise   Hyperlipidemia  Lab Results  Component Value Date   CHOL 198 06/28/2014   CHOL 206* 02/14/2013   CHOL 220* 05/10/2012   Lab Results  Component Value Date   HDL 50.70 06/28/2014   HDL 58.80 02/14/2013   HDL 56.40 05/10/2012   Lab Results  Component Value Date   LDLCALC 124* 06/28/2014   Lab Results  Component Value Date   TRIG 119.0 06/28/2014   TRIG 104.0 02/14/2013   TRIG 124.0 05/10/2012   Lab Results  Component Value Date   CHOLHDL 4 06/28/2014   CHOLHDL 4 02/14/2013   CHOLHDL 4 05/10/2012   Lab Results  Component Value Date   LDLDIRECT 137.0 02/14/2013   LDLDIRECT 149.5 05/10/2012   LDLDIRECT 149.1 11/04/2011   Overall improved - will inc the exercise to raise HDL more   Patient Active Problem List   Diagnosis Date Noted  . Encounter for Medicare annual wellness exam 02/13/2013  . Other screening mammogram 10/18/2012  . Knee pain 11/11/2011  . Tick bite 03/17/2011  . Hand pain 03/14/2011  .  Joint pain 03/11/2011  . ARTHRALGIA 07/31/2009  . [place] DISEASE, LUMBAR 04/15/2007  . Hyperlipidemia 04/06/2007  . ALLERGIC RHINITIS 04/06/2007  . ASTHMA 04/06/2007  . Osteopenia 04/06/2007  . MURMUR 04/06/2007   Past Medical History  Diagnosis Date  . Allergy     allergic rhinitis  . Asthma   . Hyperlipidemia   . Osteoporosis   . Osteopenia   . DDD (degenerative disc disease)     low back pain deg disc dz with infections  . Tinnitus   . Hearing loss     mild   Past Surgical History  Procedure Laterality Date  . Abdominal hysterectomy  1989    total, fibroids  . Torn meniscus  2007    left   . Knee cartilage surgery     History  Substance Use Topics  .  Smoking status: Never Smoker   . Smokeless tobacco: Not on file  . Alcohol Use: No   Family History  Problem Relation Age of Onset  . Osteoporosis Mother   . Cancer Mother     ? liver cancer ? primary  . Heart disease Father     MI  . Osteoporosis Sister   . Diabetes Sister   . Heart disease Sister     CHF  . Heart disease Brother     CAD  . Heart disease Brother     CAD  . Osteoporosis Sister   . Diabetes Sister    Allergies  Allergen Reactions  . Alendronate Sodium     REACTION: GI  . Azithromycin     REACTION: GI upset  . Clarithromycin     REACTION: stomach upset  . Codeine     REACTION: nausea and vomiting  . Diclofenac Sodium     REACTION: hands itching  . Fish Oil     REACTION: reflux  . Meloxicam Itching    Hands itched. And urine was rusty colored.  [name] Penicillins     REACTION: mouth swelling  . Rabeprazole Sodium     REACTION: GI  . Raloxifene     REACTION: leg pain and cramps  . Sulfonamide Derivatives     REACTION: rash   Current Outpatient Prescriptions on File Prior to Visit  Medication Sig Dispense Refill  . albuterol (PROVENTIL,VENTOLIN) 90 MCG/ACT inhaler Inhale into the lungs as needed.     . Cholecalciferol (VITAMIN D-3) 1000 UNITS CAPS Take 1 capsule by mouth daily.    . fexofenadine (ALLEGRA ODT) 30 MG disintegrating tablet Take 60 mg by mouth daily as needed.      . Flaxseed, Linseed, (FLAXSEED OIL) 1000 MG CAPS Take 1 capsule by mouth daily.    . fluticasone (FLONASE) 50 MCG/ACT nasal spray Place 2 sprays into the nose daily.      . Fluticasone-Salmeterol (ADVAIR DISKUS) 100-50 MCG/DOSE AEPB Inhale 1 puff into the lungs every 12 (twelve) hours.      . Multiple Vitamins-Minerals (HAIR/SKIN/NAILS PO) Take 1-2 tablets by mouth daily.     No current facility-administered medications on file prior to visit.     Review of Systems Review of Systems  Constitutional: Negative for fever, appetite change, fatigue and unexpected weight change.    Eyes: Negative for pain and visual disturbance.  Respiratory: Negative for cough and shortness of breath.   Cardiovascular: Negative for cp or palpitations    Gastrointestinal: Negative for nausea, diarrhea and constipation.  Genitourinary: Negative for urgency and frequency.  Skin: Negative for pallor or rash  Neurological: Negative for weakness, light-headedness, numbness and headaches.  Hematological: Negative for adenopathy. Does not bruise/bleed easily.  Psychiatric/Behavioral: Negative for dysphoric mood. The patient is not nervous/anxious.         Objective:   Physical Exam  Constitutional: She appears well-developed and well-nourished. No distress.  obese and well appearing   HENT:  Head: Normocephalic and atraumatic.  Right Ear: External ear normal.  Left Ear: External ear normal.  Nose: Nose normal.  Mouth/Throat: Oropharynx is clear and moist.  Eyes: Conjunctivae and EOM are normal. Pupils are equal, round, and reactive to light. Right eye exhibits no discharge. Left eye exhibits no discharge. No scleral icterus.  Neck: Normal range of motion. Neck supple. No JVD present. No thyromegaly present.  Cardiovascular: Normal rate, regular rhythm and intact distal pulses.  Exam reveals no gallop.   Murmur heard. Pulmonary/Chest: Effort normal and breath sounds normal. No respiratory distress. She has no wheezes. She has no rales.  Abdominal: Soft. Bowel sounds are normal. She exhibits no distension and no mass. There is no tenderness.  Genitourinary:  Breast exam: No mass, nodules, thickening, tenderness, bulging, retraction, inflamation, nipple discharge or skin changes noted.  No axillary or clavicular LA.      Musculoskeletal: She exhibits no edema or tenderness.  Lymphadenopathy:    She has no cervical adenopathy.  Neurological: She is alert. She has normal reflexes. No cranial nerve deficit. She exhibits normal muscle tone. Coordination normal.  Skin: Skin is warm and  dry. No rash noted. No erythema. No pallor.  Psychiatric: She has a normal mood and affect.          Assessment & Plan:   Problem List Items Addressed This Visit      Musculoskeletal and Integument   Osteopenia    Pt does not want to eval or tx further at this time - she read about prolia and is scared about side effects  Disc need for calcium/ vitamin D/ wt bearing exercise and bone density test every 2 y to monitor if she changes her mind Disc safety/ fracture risk in detail        Other   Encounter for Medicare annual wellness exam - Primary    Reviewed health habits including diet and exercise and skin cancer prevention Reviewed appropriate screening tests for age  Also reviewed health mt list, fam hx and immunization status , as well as social and family history   Labs reviewed  See HPI Pt will get flu shot at the pharmacy  prevnar vaccine here today  Adv to finish her adv directive         Hyperlipidemia    Disc goals for lipids and reasons to control them Rev labs with pt Rev low sat fat diet in detail       Other Visit Diagnoses    Need for vaccination with 13-polyvalent pneumococcal conjugate vaccine        Relevant Orders       Pneumococcal conjugate vaccine 13-valent (Completed)

## 2014-07-05 NOTE — Progress Notes (Signed)
Pre visit review using our clinic review tool, if applicable. No additional management support is needed unless otherwise documented below in the visit note. 

## 2014-07-06 NOTE — Assessment & Plan Note (Signed)
Reviewed health habits including diet and exercise and skin cancer prevention Reviewed appropriate screening tests for age  Also reviewed health mt list, fam hx and immunization status , as well as social and family history   Labs reviewed  See HPI Pt will get flu shot at the pharmacy  prevnar vaccine here today  Adv to finish her adv directive

## 2014-07-06 NOTE — Assessment & Plan Note (Signed)
Disc goals for lipids and reasons to control them Rev labs with pt Rev low sat fat diet in detail   

## 2014-07-06 NOTE — Assessment & Plan Note (Signed)
Pt does not want to eval or tx further at this time - she read about prolia and is scared about side effects  Disc need for calcium/ vitamin D/ wt bearing exercise and bone density test every 2 y to monitor if she changes her mind Disc safety/ fracture risk in detail

## 2014-09-05 ENCOUNTER — Ambulatory Visit: Payer: Self-pay | Admitting: General Practice

## 2014-09-13 ENCOUNTER — Encounter: Payer: Self-pay | Admitting: Family Medicine

## 2014-10-04 ENCOUNTER — Telehealth: Payer: Self-pay | Admitting: Family Medicine

## 2014-10-04 ENCOUNTER — Encounter: Payer: Self-pay | Admitting: Internal Medicine

## 2014-10-04 ENCOUNTER — Ambulatory Visit (INDEPENDENT_AMBULATORY_CARE_PROVIDER_SITE_OTHER): Payer: Medicare PPO | Admitting: Internal Medicine

## 2014-10-04 VITALS — BP 136/82 | HR 86 | Temp 98.2°F | Wt 180.0 lb

## 2014-10-04 DIAGNOSIS — M5441 Lumbago with sciatica, right side: Secondary | ICD-10-CM

## 2014-10-04 DIAGNOSIS — R109 Unspecified abdominal pain: Secondary | ICD-10-CM

## 2014-10-04 DIAGNOSIS — R509 Fever, unspecified: Secondary | ICD-10-CM

## 2014-10-04 NOTE — Progress Notes (Signed)
Pre visit review using our clinic review tool, if applicable. No additional management support is needed unless otherwise documented below in the visit note. 

## 2014-10-04 NOTE — Telephone Encounter (Signed)
Pt has appt today at [facility] with Webb Silversmith NP.

## 2014-10-04 NOTE — Telephone Encounter (Signed)
[facility] Patient Name: Jody Avery DOB: [birth year] Initial Comment Caller states she is having back pain. No fever, or other symptoms. Nurse Assessment Nurse: Markus Daft, RN, Sherre Poot Date/Time (Eastern Time): 10/04/2014 10:35:50 AM Confirm and document reason for call. If symptomatic, describe symptoms. ---Caller states she is having lower back pain, can stand and walk, but difficult to sit now, rates 6/10. Feeling better today. Difficult to stand and sit yesterday. 100.7 by mouth yesterday. No fever today, or other symptoms like burning with urination, urgency, frequency. -- She is seeing PCP for back problems since December, and received an injection for pain, and muscle relaxers. Has the patient traveled out of the country within the last 30 days? ---Not Applicable Does the patient require triage? ---Yes Related visit to physician within the last 2 weeks? ---No Does the PT have any chronic conditions? (i.e. diabetes, asthma, etc.) ---Yes List chronic conditions. ---Asthma, back pain for many years off/on Guidelines Guideline Title Affirmed Question Affirmed Notes Back Pain [1] Fever AND [2] no symptoms of UTI (Exception: has generalized muscle pains, not localized back pain) Final Disposition User See Physician within [place], [place], Sherre Poot Comments She is seeing the orthopedic doctor and due for 2nd injection next wk. and a different pain med to help the pain which does radiate to the right leg. Pt then states is there any way that my husband can bring a urine sample to be tested as I don't feel good at all due to this pain. - RN would send msg office, and office should call if this will work. Caller verb. understanding.

## 2014-10-04 NOTE — Patient Instructions (Signed)
Back Pain, Adult °Back pain is very common. The pain often gets better over time. The cause of back pain is usually not dangerous. Most people can learn to manage their back pain on their own.  °HOME CARE  °· Stay active. Start with short walks on flat ground if you can. Try to walk farther each day. °· Do not sit, drive, or stand in one place for more than 30 minutes. Do not stay in bed. °· Do not avoid exercise or work. Activity can help your back heal faster. °· Be careful when you bend or lift an object. Bend at your knees, keep the object close to you, and do not twist. °· Sleep on a firm mattress. Lie on your side, and bend your knees. If you lie on your back, put a pillow under your knees. °· Only take medicines as told by your doctor. °· Put ice on the injured area. °¨ Put ice in a plastic bag. °¨ Place a towel between your skin and the bag. °¨ Leave the ice on for 15-20 minutes, 03-04 times a day for the first 2 to 3 days. After that, you can switch between ice and heat packs. °· Ask your doctor about back exercises or massage. °· Avoid feeling anxious or stressed. Find good ways to deal with stress, such as exercise. °GET HELP RIGHT AWAY IF:  °· Your pain does not go away with rest or medicine. °· Your pain does not go away in 1 week. °· You have new problems. °· You do not feel well. °· The pain spreads into your legs. °· You cannot control when you poop (bowel movement) or pee (urinate). °· Your arms or legs feel weak or lose feeling (numbness). °· You feel sick to your stomach (nauseous) or throw up (vomit). °· You have belly (abdominal) pain. °· You feel like you may pass out (faint). °MAKE SURE YOU:  °· Understand these instructions. °· Will watch your condition. °· Will get help right away if you are not doing well or get worse. °Document Released: 02/04/2008 Document Revised: 11/10/2011 Document Reviewed: 12/20/2013 °[employer]® Patient Information ©2015 [employer]. This information is not intended  to replace advice given to you by your health care provider. Make sure you discuss any questions you have with your health care provider. ° °

## 2014-10-04 NOTE — Progress Notes (Signed)
Subjective:    Patient ID: Jody Avery, female    DOB: [birth year], 69 y.o.   MRN: [id number]  HPI  Pt presents to the clinic today with c/o right low back pain, fever and chills. She reports this started last night. Her fever was as high as 100.7. She reports she has had issues with low back pain for the last 2 months. She is being treated for sciatica. She has been on pred taper, muscle relaxer's and recently received an injection into her back 2 weeks ago. She is concerned about a possible UTI or infection from where she received her injection. She has taken Tylenol OTC with some relief of the fever and chills but it has not helped her low back pain. She did call her orthopedic doctor today and he advised her to be checked for a UTI first. She does have an appt with her orthopedist 2 weeks from today.  Review of Systems      Past Medical History  Diagnosis Date  . Allergy     allergic rhinitis  . Asthma   . Hyperlipidemia   . Osteoporosis   . Osteopenia   . DDD (degenerative disc disease)     low back pain deg disc dz with infections  . Tinnitus   . Hearing loss     mild    Current Outpatient Prescriptions  Medication Sig Dispense Refill  . albuterol (PROVENTIL HFA;VENTOLIN HFA) 108 (90 BASE) MCG/ACT inhaler Inhale into the lungs.    . calcium-vitamin D (OSCAL WITH D) 500-200 MG-UNIT per tablet Take 1 tablet by mouth.    [name] CHERRY PO Take 1 capsule by mouth daily. 1200mg     . Cholecalciferol (VITAMIN D-3) 1000 UNITS CAPS Take 3 capsules by mouth daily.     . Cholecalciferol 1000 UNITS capsule Take 1,000 Units by mouth daily.     . fexofenadine (ALLEGRA ODT) 30 MG disintegrating tablet Take 60 mg by mouth daily as needed.      . Flaxseed, Linseed, (FLAXSEED OIL) 1000 MG CAPS Take 1 capsule by mouth daily.    . fluticasone (FLONASE) 50 MCG/ACT nasal spray Place 2 sprays into the nose daily.      . Fluticasone-Salmeterol (ADVAIR DISKUS) 100-50 MCG/DOSE AEPB Inhale 1 puff  into the lungs Avery 12 (twelve) hours.      . Fluticasone-Salmeterol (ADVAIR) 100-50 MCG/DOSE AEPB Inhale into the lungs.    [name] HYDROcodone-acetaminophen (NORCO/VICODIN) 5-325 MG per tablet Take by mouth.    . Multiple Vitamins-Minerals (HAIR/SKIN/NAILS PO) Take 1-2 tablets by mouth daily.    . Multiple Vitamins-Minerals (HEALTHY EYES PO) Take 1 capsule by mouth daily.    . Pyridoxine HCl (VITAMIN B-6 PO) Take 1 tablet by mouth daily.    . Tavaborole 5 % SOLN Apply topically.    . traMADol (ULTRAM) 50 MG tablet Take by mouth.    . Turmeric 500 MG CAPS Take 1 capsule by mouth daily.     No current facility-administered medications for this visit.    Allergies  Allergen Reactions  . Alendronate Sodium     REACTION: GI  . Azithromycin     REACTION: GI upset  . Clarithromycin     REACTION: stomach upset  . Codeine     REACTION: nausea and vomiting  . Diclofenac Sodium     REACTION: hands itching  . Fish Oil     REACTION: reflux  . Meloxicam Itching    Hands itched. And urine was rusty colored.  [name]  Penicillins     REACTION: mouth swelling  . Rabeprazole Sodium     REACTION: GI  . Raloxifene     REACTION: leg pain and cramps  . Sulfonamide Derivatives     REACTION: rash    Family History  Problem Relation Age of Onset  . Osteoporosis Mother   . Cancer Mother     ? liver cancer ? primary  . Heart disease Father     MI  . Osteoporosis Sister   . Diabetes Sister   . Heart disease Sister     CHF  . Heart disease Brother     CAD  . Heart disease Brother     CAD  . Osteoporosis Sister   . Diabetes Sister     History   Social History  . Marital Status: Married    Spouse Name: N/A    Number of Children: N/A  . Years of Education: N/A   Occupational History  . Not on file.   Social History Main Topics  . Smoking status: Never Smoker   . Smokeless tobacco: Not on file  . Alcohol Use: No  . Drug Use: No  . Sexual Activity: Not on file   Other Topics Concern    . Not on file   Social History Narrative     Constitutional: Pt reports fever and chills. Denies malaise, fatigue, headache or abrupt weight changes.  Respiratory: Denies difficulty breathing, shortness of breath, cough or sputum production.   Cardiovascular: Denies chest pain, chest tightness, palpitations or swelling in the hands or feet.  Gastrointestinal: Denies abdominal pain, bloating, constipation, diarrhea or blood in the stool.  GU: Denies urgency, frequency, pain with urination, burning sensation, blood in urine, odor or discharge. Musculoskeletal: Pt reports low back pain. Denies muscle pain or joint pain and swelling.  Skin: Denies redness, rashes, lesions or ulcercations.  Neurological: Denies dizziness, difficulty with memory, difficulty with speech or problems with balance and coordination.   No other specific complaints in a complete review of systems (except as listed in HPI above).  Objective:   Physical Exam  BP 136/82 mmHg  Pulse 86  Temp(Src) 98.2 F (36.8 C) (Oral)  Wt 180 lb (81.647 kg)  SpO2 98% Wt Readings from Last 3 Encounters:  10/04/14 180 lb (81.647 kg)  07/05/14 187 lb 8 oz (85.049 kg)  02/16/13 184 lb 8 oz (83.689 kg)    General: Appears her stated age, appear in pain but in NAD. Skin: Warm, dry and intact. No redness, warmth or drainage noted from injection site. Cardiovascular: Normal rate and rhythm. S1,S2 noted.  No murmur, rubs or gallops noted.  Pulmonary/Chest: Normal effort and positive vesicular breath sounds. No respiratory distress. No wheezes, rales or ronchi noted.  Abdomen: Soft and nontender. Normal bowel sounds, no bruits noted. No distention or masses noted. Liver, spleen and kidneys non palpable. No CVA tenderness noted. Musculoskeletal: Decreased flexion, extension and lateral rotation of the spine. Pain with palpation of the lumbar spine. Strength 4/5 RLE, 5/5 LLE. Mildly ataxic gait. Neurological: Alert and oriented.  Sensation intact to BLE. Positive straight leg raise on the right.   BMET    Component Value Date/Time   NA 142 06/28/2014 0903   K 4.3 06/28/2014 0903   CL 107 06/28/2014 0903   CO2 22 06/28/2014 0903   GLUCOSE 93 06/28/2014 0903   BUN 10 06/28/2014 0903   CREATININE 0.8 06/28/2014 0903   CALCIUM 9.7 06/28/2014 0903   GFRNONAA 93.47  08/06/2010 0839   GFRAA 108 07/26/2008 1144    Lipid Panel     Component Value Date/Time   CHOL 198 06/28/2014 0903   TRIG 119.0 06/28/2014 0903   HDL 50.70 06/28/2014 0903   CHOLHDL 4 06/28/2014 0903   VLDL 23.8 06/28/2014 0903   LDLCALC 124* 06/28/2014 0903    CBC    Component Value Date/Time   WBC 4.8 06/28/2014 0903   RBC 4.69 06/28/2014 0903   HGB 14.0 06/28/2014 0903   HCT 42.3 06/28/2014 0903   PLT 187.0 06/28/2014 0903   MCV 90.2 06/28/2014 0903   MCHC 33.0 06/28/2014 0903   RDW 13.0 06/28/2014 0903   LYMPHSABS 1.7 06/28/2014 0903   MONOABS 0.5 06/28/2014 0903   EOSABS 0.2 06/28/2014 0903   BASOSABS 0.0 06/28/2014 0903    Hgb A1C No results found for: HGBA1C       Assessment & Plan:   Low back pain, fever and chills:  I think the low back pain is coming from her sciatica Tramadol made her "loopy" and did not help the pain She has a RX for Norco but has not had it filled yet- advised her to start this Continue a heating pad Continue to follow with orthopedics Urinalysis: normal No s/s of infection at injection site No s/s of systemic infection  Norco has tylenol in it to help with the fevers  RTC as needed or if symptoms persist or worsen

## 2014-10-04 NOTE — Telephone Encounter (Signed)
PLEASE NOTE: All timestamps contained within this report are represented as [place] Standard Time. CONFIDENTIALTY NOTICE: This fax transmission is intended only for the addressee. It contains information that is legally privileged, confidential or otherwise protected from use or disclosure. If you are not the intended recipient, you are strictly prohibited from reviewing, disclosing, copying using or disseminating any of this information or taking any action in reliance on or regarding this information. If you have received this fax in error, please notify us immediately by telephone so that we can arrange for its return to [place]. Phone: [phone], Toll-Free: [phone], Fax: [phone] Page: 1 of 2 Call Id: [id number] [place] Patient Name: Jody Avery Gender: Female DOB: Dec 06, 1945 Age: 69 Y 24 M 2 D Return Phone Number: [phone] (Primary) Address: City/State/Zip:  Client [facility] - Client Client Site [place], [place] Contact Type Call Call Type Triage / Clinical Relationship To Patient Self Appointment Disposition EMR Appointment Scheduled Return Phone Number [phone] (Primary) Chief Complaint Back Pain - General Initial Comment Caller states she is having back pain. No fever, or other symptoms. PreDisposition Go to Urgent Care/Walk-In Clinic Info pasted into Epic Yes Nurse Assessment Nurse: Markus Daft, RN, Sherre Poot Date/Time Eilene Ghazi Time): 10/04/2014 10:35:50 AM Confirm and document reason for call. If symptomatic, describe symptoms. ---Caller states she is having lower back pain, can stand and walk, but difficult to sit now, rates 6/10. Feeling better today. Difficult to stand and sit yesterday. 100.7 by mouth yesterday. No fever today, or other symptoms like burning with urination, urgency, frequency. --  She is seeing PCP for back problems since December, and received an injection for pain, and muscle relaxers. Has the patient traveled out of the country within the last 30 days? ---Not Applicable Does the patient require triage? ---Yes Related visit to physician within the last 2 weeks? ---No Does the PT have any chronic conditions? (i.e. diabetes, asthma, etc.) ---Yes List chronic conditions. ---Asthma, back pain for many years off/on Guidelines Guideline Title Affirmed Question Affirmed Notes Nurse Date/Time (Eastern Time) Back Pain [1] Fever AND [2] no symptoms of UTI (Exception: has generalized muscle pains, not localized back pain) Markus Daft, RN, Sherre Poot 10/04/2014 10:39:56 AM Disp. Time Eilene Ghazi Time) Disposition Final User 10/04/2014 10:44:15 AM See Physician within 24 Hours Yes Markus Daft, RN, Sherre Poot PLEASE NOTE: All timestamps contained within this report are represented as [place] Standard Time. CONFIDENTIALTY NOTICE: This fax transmission is intended only for the addressee. It contains information that is legally privileged, confidential or otherwise protected from use or disclosure. If you are not the intended recipient, you are strictly prohibited from reviewing, disclosing, copying using or disseminating any of this information or taking any action in reliance on or regarding this information. If you have received this fax in error, please notify us immediately by telephone so that we can arrange for its return to [place]. Phone: [phone], Toll-Free: [phone], Fax: [phone] Page: 2 of 2 Call Id: [id number] Caller Understands: Yes Disagree/Comply: Comply Care Advice Given Per Guideline SEE PHYSICIAN WITHIN 24 HOURS: * IF OFFICE WILL BE OPEN: You need to be examined within the next 24 hours. Call your doctor when the office opens, and make an appointment. PAIN OR FEVER MEDICINES: * For pain and fever relief, take acetaminophen or ibuprofen. * Treat fevers above 101 F (38.3 C).  * Before taking any medicine, read all the  instructions on the package. PAIN MEDICINES: * For pain relief, take acetaminophen, ibuprofen, or naproxen. * Before taking any medicine, read all the instructions on the package. CALL BACK IF: * You become worse. CARE ADVICE given per Back Pain (Adult) guideline. After Care Instructions Given Call Event Type User Date / Time Description Comments User: Mayford Knife, RN Date/Time Eilene Ghazi Time): 10/04/2014 10:51:55 AM She is seeing the orthopedic doctor and due for 2nd injection next wk. and a different pain med to help the pain which does radiate to the right leg. User: Mayford Knife, RN Date/Time Eilene Ghazi Time): 10/04/2014 10:54:39 AM Pt then states is there any way that my husband can bring a urine sample to be tested as I don't feel good at all due to this pain. - RN would send msg and office should call if this will work. Caller verb. understanding. Referrals REFERRED TO PCP OFFICE

## 2014-10-16 LAB — POCT URINALYSIS DIPSTICK
Bilirubin, UA: NEGATIVE
Blood, UA: NEGATIVE
Glucose, UA: NEGATIVE
Ketones, UA: NEGATIVE
Leukocytes, UA: NEGATIVE
Nitrite, UA: NEGATIVE
Protein, UA: NEGATIVE
Spec Grav, UA: <=1.005
Urobilinogen, UA: NEGATIVE
pH, UA: 6.5

## 2014-10-16 NOTE — Addendum Note (Signed)
Addended by: Lurlean Nanny on: 10/16/2014 12:16 PM   Modules accepted: Orders

## 2014-12-11 ENCOUNTER — Telehealth: Payer: Self-pay | Admitting: Family Medicine

## 2014-12-11 NOTE — Telephone Encounter (Signed)
Spoke to patient regarding mammograms.  She will just do a normal mammogram at this time.

## 2014-12-11 NOTE — Telephone Encounter (Signed)
3D mammograms are good for dense breasts - if she tends to have lumpy breasts that are hard for her to self examine or if she has a hx of breast proceedures or abnormal mammograms I would recommend it

## 2014-12-11 NOTE — Telephone Encounter (Signed)
Pt is wanting to know if you recommend a 3-d mammogram for her or if the regular mammogram is sufficient. Please call pt back at [phone] so she can schedule correctly.  Thanks

## 2014-12-15 ENCOUNTER — Other Ambulatory Visit: Payer: Self-pay

## 2014-12-15 DIAGNOSIS — Z1231 Encounter for screening mammogram for malignant neoplasm of breast: Secondary | ICD-10-CM

## 2014-12-24 NOTE — Op Note (Signed)
PATIENT NAME:  Jody Avery, Jody Avery MR#:  [id number] DATE OF BIRTH:  May 13, 1946  DATE OF PROCEDURE:  02/13/2012  PREOPERATIVE DIAGNOSIS: Stenosing tenosynovitis of the right index, long, and ring fingers.   POSTOPERATIVE DIAGNOSIS:  Stenosing tenosynovitis of the right index, long, and ring fingers.   PROCEDURE PERFORMED: Right trigger finger release to the index, long, and ring digits.   SURGEON: Laurice Record. Holley Bouche., MD    ANESTHESIA: General.   ESTIMATED BLOOD LOSS: Minimal.   TOURNIQUET TIME: 28 minutes.   DRAINS: None.   INDICATIONS FOR SURGERY: The patient is a 69 year old female who has been seen for complaints of triggering and locking of the right index, long, and ring fingers. After discussion of the risks and benefits of surgical intervention, the patient expressed understanding of the risks and benefits and agreed with plans for surgical intervention.   PROCEDURE IN DETAIL: The patient was brought into the Operating Room, and after adequate general anesthesia was achieved a tourniquet was placed on the patient's upper right arm. The patient's right hand and arm were cleaned and prepped with alcohol and DuraPrep and draped in the usual sterile fashion. A "timeout" was performed as per usual protocol. Loupe magnification was used throughout the procedure. The right upper extremity was exsanguinated using an Esmarch, and the tourniquet was inflated to 250 mmHg. A transverse incision was made along the proximal margin of the A1 pulley of the ring finger. Dissection was carried down to the A1 pulley and the pulley was sharply incised. Complete release was noted in this fashion. Next, a transverse incision was made at the base of the A1 pulley of the long finger. The A1 pulley  was sharply incised using tenotomy scissors. This allowed for a smooth excursion of the flexor tendon. Finally, a transverse incision was made at the base of the A1 pulley of the index finger. Dissection was carried down  to the A1 pulley which was sharply incised using tenotomy scissors.   The wounds were irrigated with copious amounts of normal saline with antibiotic solution. The incisions were reapproximated using interrupted sutures of 5-0 nylon. Marcaine 0.25% was injected along the incision sites. A sterile dressing was applied. The tourniquet was deflated after a total tourniquet time of 28 minutes.   The patient tolerated the procedure well. She was transported to the recovery room in stable condition.   ____________________________ Laurice Record. Holley Bouche., MD jph:cbb D: 02/14/2012 00:42:24 ET T: 02/14/2012 11:09:06 ET JOB#: [id number]  cc: Jeneen Rinks P. Holley Bouche., MD, <Dictator> Laurice Record Holley Bouche MD ELECTRONICALLY SIGNED 02/20/2012 12:36

## 2014-12-24 NOTE — Op Note (Signed)
PATIENT NAME:  Jody Avery, PINDER MR#:  [id number] DATE OF BIRTH:  10/12/1945  DATE OF PROCEDURE:  10/08/2011  PREOPERATIVE DIAGNOSIS: Internal derangement of the right knee.   POSTOPERATIVE DIAGNOSES:  1. Tear of the posterior horn medial meniscus, right knee.  2. Grade 3 chondromalacia involving the medial compartment.   PROCEDURES PERFORMED:  1. Right knee arthroscopy.  2. Partial medial meniscectomy.  3. Medial chondroplasty.   SURGEON: Laurice Record. Holley Bouche., MD   ANESTHESIA: General.   ESTIMATED BLOOD LOSS: Minimal.   IV FLUIDS: 500 mL of crystalloid.   TOURNIQUET TIME: Not used.   DRAINS: None.   INDICATIONS FOR SURGERY: The patient is a 69 year old female who has been seen for complaints of right knee pain. MRI demonstrated findings consistent with meniscal pathology. After a discussion of the risks and benefits of surgical intervention, the patient expressed her understanding of the risks and benefits and agreed with plans for surgical intervention.   PROCEDURE IN DETAIL: The patient was brought into the Operating Room and after adequate general anesthesia was achieved, a tourniquet was placed on the patient's right thigh. The leg was placed in a leg holder. All bony prominences were well padded. The patient's right knee and leg were cleaned and prepped with alcohol and DuraPrep and draped in the usual sterile fashion. A "timeout" was performed as per usual protocol. The anticipated portal sites were injected with 0.25% Marcaine with epinephrine. An anterolateral portal was created and a cannula was inserted. A moderate effusion was evacuated. The scope was inserted and the knee was distended with fluid using the DePuy Mitek Pump. The scope was advanced down the medial gutter into the medial compartment of the knee. Under visualization with the scope, an anteromedial portal was created and a hook probe was inserted. Inspection of the medial compartment showed the anterior horn of the  medial meniscus to be intact. However, there was a complex tear of the posterior horn with prominent horizontal cleavage component as well as marked fraying. The tear was debrided back to the base of the tear using meniscal punches and a 4.5 mm shaver. A transition zone was contoured accordingly. Final contouring was performed using a 50-degree ArthroCare wand. The remaining rim of meniscus was visualized and probed and felt to be stable. Inspection of the articular cartilage demonstrated some grade 3 changes primarily to the medial femoral condyle at its distal aspect. These areas were debrided and contoured using the 50-degree ArthroCare wand. The scope was then advanced into the intercondylar region. The anterior cruciate ligament was visualized and probed and felt to be stable. The scope was removed from the anterolateral portal and reinserted via the anteromedial portal so as to better visualize the lateral compartment. The articular surface of the lateral compartment was in excellent condition. The lateral meniscus was visualized and probed and felt to be stable. No evidence of tear was appreciated. Finally, the scope was positioned so as to visualize the patellofemoral articulation. The articular surface was in excellent condition. Good patellar tracking was noted.   The knee was irrigated with copious amounts of fluid and then suctioned dry. The anterolateral portal was reapproximated using 3-0 nylon. A combination of 0.25% Marcaine with epinephrine and 4 mg of morphine was injected via the scope. The scope was removed, and the anteromedial portal was reapproximated using 3-0 nylon. Sterile dressing was applied, followed by application of an ice wrap.   The patient tolerated the procedure well. She was transported to the recovery room  in stable condition.   ____________________________ Laurice Record. Holley Bouche., MD jph:cbb D: 10/08/2011 17:34:58 ET T: 10/08/2011 18:44:20 ET JOB#: [id number]  cc: Jeneen Rinks P.  Holley Bouche., MD, <Dictator> Laurice Record Holley Bouche MD ELECTRONICALLY SIGNED 10/12/2011 9:24

## 2015-01-01 ENCOUNTER — Ambulatory Visit: Payer: BC Managed Care – PPO

## 2015-01-22 ENCOUNTER — Ambulatory Visit: Payer: BC Managed Care – PPO

## 2015-03-21 ENCOUNTER — Ambulatory Visit
Admission: RE | Admit: 2015-03-21 | Discharge: 2015-03-21 | Disposition: A | Discharge status: Home / Self Care | Payer: Medicare PPO | Source: Ambulatory Visit

## 2015-03-21 DIAGNOSIS — Z1231 Encounter for screening mammogram for malignant neoplasm of breast: Secondary | ICD-10-CM

## 2015-03-21 LAB — HM PAP SMEAR: HM Pap smear: NORMAL

## 2015-03-22 ENCOUNTER — Encounter: Payer: Self-pay | Admitting: Family Medicine

## 2015-03-22 ENCOUNTER — Encounter: Payer: Self-pay | Admitting: *Deleted

## 2015-06-21 ENCOUNTER — Telehealth: Payer: Self-pay | Admitting: *Deleted

## 2015-06-21 NOTE — Telephone Encounter (Signed)
I recommend the regular dose one

## 2015-06-21 NOTE — Telephone Encounter (Signed)
Pt left voicemail at Triage, pt is calling requesting Dr. Marliss Coots advise. Pt was going to get flu shot at pharmacy and the pharmacist suggested the High dose vaccine but she thinks last year you advise her to get regular vaccine not high dose so she would like to know what Dr. Glori Bickers suggest, please advise

## 2015-06-21 NOTE — Telephone Encounter (Signed)
Pt notified of Dr. Tower's recommendations  

## 2015-07-23 ENCOUNTER — Telehealth: Payer: Self-pay | Admitting: Family Medicine

## 2015-07-23 DIAGNOSIS — E785 Hyperlipidemia, unspecified: Secondary | ICD-10-CM

## 2015-07-23 DIAGNOSIS — Z0000 Encounter for general adult medical examination without abnormal findings: Secondary | ICD-10-CM

## 2015-07-23 NOTE — Telephone Encounter (Signed)
-----   Message from Ellamae Sia sent at 07/17/2015  4:18 PM EST ----- Regarding: Lab orders for Tuesdday, 11.22.16 Patient is scheduled for CPX labs, please order future labs, Thanks , Karna Christmas

## 2015-07-24 ENCOUNTER — Other Ambulatory Visit (INDEPENDENT_AMBULATORY_CARE_PROVIDER_SITE_OTHER): Payer: Medicare PPO

## 2015-07-24 DIAGNOSIS — Z0000 Encounter for general adult medical examination without abnormal findings: Secondary | ICD-10-CM | POA: Diagnosis not present

## 2015-07-24 DIAGNOSIS — E785 Hyperlipidemia, unspecified: Secondary | ICD-10-CM

## 2015-07-24 LAB — TSH: TSH: 1.42 u[IU]/mL (ref 0.35–4.50)

## 2015-07-24 LAB — LIPID PANEL
Cholesterol: 205 mg/dL — ABNORMAL HIGH (ref 0–200)
HDL: 46.6 mg/dL (ref >39.00)
LDL Cholesterol: 127 mg/dL — ABNORMAL HIGH (ref 0–99)
NonHDL: 158.67
Total CHOL/HDL Ratio: 4
Triglycerides: 159 mg/dL — ABNORMAL HIGH (ref 0.0–149.0)
VLDL: 31.8 mg/dL (ref 0.0–40.0)

## 2015-07-24 LAB — CBC WITH DIFFERENTIAL/PLATELET
Basophils Absolute: 0 10*3/uL (ref 0.0–0.1)
Basophils Relative: 0.8 % (ref 0.0–3.0)
Eosinophils Absolute: 0.3 10*3/uL (ref 0.0–0.7)
Eosinophils Relative: 5.6 % — ABNORMAL HIGH (ref 0.0–5.0)
HCT: 40.6 % (ref 36.0–46.0)
Hemoglobin: 13.7 g/dL (ref 12.0–15.0)
Lymphocytes Relative: 33 % (ref 12.0–46.0)
Lymphs Abs: 1.6 10*3/uL (ref 0.7–4.0)
MCHC: 33.7 g/dL (ref 30.0–36.0)
MCV: 91 fl (ref 78.0–100.0)
Monocytes Absolute: 0.5 10*3/uL (ref 0.1–1.0)
Monocytes Relative: 11.1 % (ref 3.0–12.0)
Neutro Abs: 2.4 10*3/uL (ref 1.4–7.7)
Neutrophils Relative %: 49.5 % (ref 43.0–77.0)
Platelets: 207 10*3/uL (ref 150.0–400.0)
RBC: 4.46 Mil/uL (ref 3.87–5.11)
RDW: 13.8 % (ref 11.5–15.5)
WBC: 4.8 10*3/uL (ref 4.0–10.5)

## 2015-07-24 LAB — COMPREHENSIVE METABOLIC PANEL
ALT: 16 U/L (ref 0–35)
AST: 17 U/L (ref 0–37)
Albumin: 4 g/dL (ref 3.5–5.2)
Alkaline Phosphatase: 64 U/L (ref 39–117)
BUN: 11 mg/dL (ref 6–23)
CO2: 28 mEq/L (ref 19–32)
Calcium: 9.7 mg/dL (ref 8.4–10.5)
Chloride: 103 mEq/L (ref 96–112)
Creatinine, Ser: 0.75 mg/dL (ref 0.40–1.20)
GFR: 80.87 mL/min (ref >60.00)
Glucose, Bld: 102 mg/dL — ABNORMAL HIGH (ref 70–99)
Potassium: 4.3 mEq/L (ref 3.5–5.1)
Sodium: 139 mEq/L (ref 135–145)
Total Bilirubin: 0.6 mg/dL (ref 0.2–1.2)
Total Protein: 7 g/dL (ref 6.0–8.3)

## 2015-07-31 ENCOUNTER — Encounter: Payer: Self-pay | Admitting: Family Medicine

## 2015-07-31 ENCOUNTER — Encounter: Payer: Self-pay | Admitting: *Deleted

## 2015-07-31 ENCOUNTER — Ambulatory Visit (INDEPENDENT_AMBULATORY_CARE_PROVIDER_SITE_OTHER): Payer: Medicare PPO | Admitting: Family Medicine

## 2015-07-31 VITALS — BP 135/80 | HR 74 | Temp 98.0°F | Ht 63.0 in | Wt 194.8 lb

## 2015-07-31 DIAGNOSIS — M8580 Other specified disorders of bone density and structure, unspecified site: Secondary | ICD-10-CM

## 2015-07-31 DIAGNOSIS — E785 Hyperlipidemia, unspecified: Secondary | ICD-10-CM | POA: Diagnosis not present

## 2015-07-31 DIAGNOSIS — Z0000 Encounter for general adult medical examination without abnormal findings: Secondary | ICD-10-CM

## 2015-07-31 NOTE — Progress Notes (Signed)
Pre visit review using our clinic review tool, if applicable. No additional management support is needed unless otherwise documented below in the visit note. 

## 2015-07-31 NOTE — Patient Instructions (Signed)
Eat a lower carb diet with more lean protein  Add back your vitamin D (calcium if you can tolerate it )  Wear support stockings to the waist for your veins  Let us know if you want to schedule a follow up bone density test   Follow up for urinary symptoms if needed  Also work on weight loss  Stay as active as you can be   Zantac over the counter is ok to try for indigestion

## 2015-07-31 NOTE — Progress Notes (Signed)
Subjective:    Patient ID: Jody Avery, female    DOB: 1945-09-19, 69 y.o.   MRN: [id number]  HPI  Here for annual medicare wellness visit as well as chronic/acute medical problems as well as annual preventative visit   Wt is up 14 lb with bmi of 34 She has not eaten well since her surgery (all carbs/not enough protein) Now finally back to water exercise 3 d per week- water walking and using water resistance equipt Obese Sore after exercise    I have personally reviewed the Medicare Annual Wellness questionnaire and have noted 1. The patient's medical and social history 2. Their use of alcohol, tobacco or illicit drugs 3. Their current medications and supplements 4. The patient's functional ability including ADL's, fall risks, home safety risks and hearing or visual             impairment. 5. Diet and physical activities 6. Evidence for depression or mood disorders  The patients weight, height, BMI have been recorded in the chart and visual acuity is per eye clinic.  I have made referrals, counseling and provided education to the patient based review of the above and I have provided the pt with a written personalized care plan for preventive services. Reviewed and updated provider list, see scanned forms.  Had a rough year  Had to have back surgery L2-L3 discectomy in May  Since then she has lost 3 family members  Stress-somatic symptoms  She quit taking all her medicine except her advair currently  Thought tumeric was causing bruising  occ mylanta for heartburn - esp with raw vegetables , worse when she gets rushed or stressed  See scanned forms.  Routine anticipatory guidance given to patient.  See health maintenance. Colon cancer screening 7/13 colonosc nl  Breast cancer screening mm 7/16 nl Self breast exam - no lumps  Flu vaccine 10/16  Tetanus vaccine 3/12  Pneumovax - due for pna 23- wants to wait a year on that  Zoster vaccine 12/08 Hep C - declines/not  high risk dexa 4/14 - last time she did not want further checks or treatment after looking at info on prolia  Has had a fall (after running from a dog)- bruised/ no fractures   Last D level 27 in 2015  Advance directive - has a living will and POA - has it drawn up  Cognitive function addressed- see scanned forms- and if abnormal then additional documentation follows. - no concerns   PMH and SH reviewed  Meds, vitals, and allergies reviewed.   ROS: See HPI.  Otherwise negative.    Glucose 102 - sister is diabetic   Eats too many carbs   Cholesterol Lab Results  Component Value Date   CHOL 205* 07/24/2015   CHOL 198 06/28/2014   CHOL 206* 02/14/2013   Lab Results  Component Value Date   HDL 46.60 07/24/2015   HDL 50.70 06/28/2014   HDL 58.80 02/14/2013   Lab Results  Component Value Date   LDLCALC 127* 07/24/2015   LDLCALC 124* 06/28/2014   Lab Results  Component Value Date   TRIG 159.0* 07/24/2015   TRIG 119.0 06/28/2014   TRIG 104.0 02/14/2013   Lab Results  Component Value Date   CHOLHDL 4 07/24/2015   CHOLHDL 4 06/28/2014   CHOLHDL 4 02/14/2013   Lab Results  Component Value Date   LDLDIRECT 137.0 02/14/2013   LDLDIRECT 149.5 05/10/2012   LDLDIRECT 149.1 11/04/2011    Eating more carbs -  needs to get back on track with diet    Review of Systems    Review of Systems  Constitutional: Negative for fever, appetite change, fatigue and unexpected weight change.  Eyes: Negative for pain and visual disturbance.  Respiratory: Negative for cough and shortness of breath.   Cardiovascular: Negative for cp or palpitations   pos for varicose veins in legs that bother her Gastrointestinal: Negative for nausea, diarrhea and constipation.  Genitourinary: Negative for urgency and pos for frequency, a little burning (she thinks she does drink enough water) Skin: Negative for pallor or rash   Neurological: Negative for weakness, light-headedness, numbness and headaches.   Hematological: Negative for adenopathy. Does not bruise/bleed easily.  Psychiatric/Behavioral: Negative for dysphoric mood. The patient is not nervous/anxious.      Objective:   Physical Exam  Constitutional: She appears well-developed and well-nourished. No distress.  overwt and well app  HENT:  Head: Normocephalic and atraumatic.  Right Ear: External ear normal.  Left Ear: External ear normal.  Mouth/Throat: Oropharynx is clear and moist.  Eyes: Conjunctivae and EOM are normal. Pupils are equal, round, and reactive to light. No scleral icterus.  Neck: Normal range of motion. Neck supple. No JVD present. Carotid bruit is not present. No thyromegaly present.  Cardiovascular: Normal rate, regular rhythm and intact distal pulses.  Exam reveals no gallop.   Murmur heard. Pulmonary/Chest: Effort normal and breath sounds normal. No respiratory distress. She has no wheezes. She exhibits no tenderness.  Abdominal: Soft. Bowel sounds are normal. She exhibits no distension, no abdominal bruit and no mass. There is no tenderness.  Genitourinary: No breast swelling, tenderness, discharge or bleeding.  Breast exam: No mass, nodules, thickening, tenderness, bulging, retraction, inflamation, nipple discharge or skin changes noted.  No axillary or clavicular LA.      Musculoskeletal: She exhibits no edema or tenderness.  Poor rom knees   Lymphadenopathy:    She has no cervical adenopathy.  Neurological: She is alert. She has normal reflexes. No cranial nerve deficit. She exhibits normal muscle tone. Coordination normal.  Skin: Skin is warm and dry. No rash noted. No erythema. No pallor.  Psychiatric: She has a normal mood and affect.          Assessment & Plan:   Problem List Items Addressed This Visit      Musculoskeletal and Integument   Osteopenia - Primary    Pt declines further eval or tx at this time but may consider for later Was worried about pot side eff with prolia Disc need  for calcium/ vitamin D/ wt bearing exercise and bone density test Avery 1 y to monitor Disc safety/ fracture risk in detail          Other   Encounter for Medicare annual wellness exam    Reviewed health habits including diet and exercise and skin cancer prevention Reviewed appropriate screening tests for age  Also reviewed health mt list, fam hx and immunization status , as well as social and family history   See HPI  Eat a lower carb diet with more lean protein  Add back your vitamin D (calcium if you can tolerate it )  Wear support stockings to the waist for your veins  Let us know if you want to schedule a follow up bone density test       Hyperlipidemia    Disc goals for lipids and reasons to control them Rev labs with pt Rev low sat fat diet in detail  Routine general medical examination at a health care facility    Reviewed health habits including diet and exercise and skin cancer prevention Reviewed appropriate screening tests for age  Also reviewed health mt list, fam hx and immunization status , as well as social and family history   See HPI  Eat a lower carb diet with more lean protein  Add back your vitamin D (calcium if you can tolerate it )  Wear support stockings to the waist for your veins  Let us know if you want to schedule a follow up bone density test

## 2015-08-02 NOTE — Assessment & Plan Note (Signed)
Pt declines further eval or tx at this time but may consider for later Was worried about pot side eff with prolia Disc need for calcium/ vitamin D/ wt bearing exercise and bone density test every 2 y to monitor Disc safety/ fracture risk in detail

## 2015-08-02 NOTE — Assessment & Plan Note (Signed)
Reviewed health habits including diet and exercise and skin cancer prevention Reviewed appropriate screening tests for age  Also reviewed health mt list, fam hx and immunization status , as well as social and family history   See HPI  Eat a lower carb diet with more lean protein  Add back your vitamin D (calcium if you can tolerate it )  Wear support stockings to the waist for your veins  Let us know if you want to schedule a follow up bone density test

## 2015-08-02 NOTE — Assessment & Plan Note (Signed)
Disc goals for lipids and reasons to control them Rev labs with pt Rev low sat fat diet in detail   

## 2016-04-09 ENCOUNTER — Other Ambulatory Visit: Payer: Self-pay | Admitting: Family Medicine

## 2016-04-09 DIAGNOSIS — Z1231 Encounter for screening mammogram for malignant neoplasm of breast: Secondary | ICD-10-CM

## 2016-04-18 ENCOUNTER — Ambulatory Visit
Admission: RE | Admit: 2016-04-18 | Discharge: 2016-04-18 | Disposition: A | Discharge status: Home / Self Care | Payer: Medicare PPO | Source: Ambulatory Visit | Attending: Family Medicine | Admitting: Family Medicine

## 2016-04-18 DIAGNOSIS — Z1231 Encounter for screening mammogram for malignant neoplasm of breast: Secondary | ICD-10-CM

## 2016-04-18 LAB — HM MAMMOGRAPHY

## 2016-04-24 ENCOUNTER — Encounter: Payer: Self-pay | Admitting: *Deleted

## 2016-07-31 ENCOUNTER — Other Ambulatory Visit: Payer: Medicare PPO

## 2016-07-31 ENCOUNTER — Ambulatory Visit (INDEPENDENT_AMBULATORY_CARE_PROVIDER_SITE_OTHER): Payer: Medicare PPO

## 2016-07-31 VITALS — BP 120/78 | HR 72 | Temp 98.0°F | Ht 62.25 in | Wt 176.2 lb

## 2016-07-31 DIAGNOSIS — R7989 Other specified abnormal findings of blood chemistry: Secondary | ICD-10-CM

## 2016-07-31 DIAGNOSIS — R739 Hyperglycemia, unspecified: Secondary | ICD-10-CM | POA: Diagnosis not present

## 2016-07-31 DIAGNOSIS — E559 Vitamin D deficiency, unspecified: Secondary | ICD-10-CM | POA: Diagnosis not present

## 2016-07-31 DIAGNOSIS — Z0000 Encounter for general adult medical examination without abnormal findings: Secondary | ICD-10-CM

## 2016-07-31 DIAGNOSIS — E784 Other hyperlipidemia: Secondary | ICD-10-CM

## 2016-07-31 DIAGNOSIS — Z1159 Encounter for screening for other viral diseases: Secondary | ICD-10-CM

## 2016-07-31 DIAGNOSIS — E7849 Other hyperlipidemia: Secondary | ICD-10-CM

## 2016-07-31 DIAGNOSIS — Z1329 Encounter for screening for other suspected endocrine disorder: Secondary | ICD-10-CM

## 2016-07-31 DIAGNOSIS — Z23 Encounter for immunization: Secondary | ICD-10-CM

## 2016-07-31 LAB — CBC WITH DIFFERENTIAL/PLATELET
Basophils Absolute: 0 10*3/uL (ref 0.0–0.1)
Basophils Relative: 0.8 % (ref 0.0–3.0)
Eosinophils Absolute: 0.1 10*3/uL (ref 0.0–0.7)
Eosinophils Relative: 2.6 % (ref 0.0–5.0)
HCT: 41.6 % (ref 36.0–46.0)
Hemoglobin: 14 g/dL (ref 12.0–15.0)
Lymphocytes Relative: 33.8 % (ref 12.0–46.0)
Lymphs Abs: 1.5 10*3/uL (ref 0.7–4.0)
MCHC: 33.6 g/dL (ref 30.0–36.0)
MCV: 89.7 fl (ref 78.0–100.0)
Monocytes Absolute: 0.5 10*3/uL (ref 0.1–1.0)
Monocytes Relative: 11.3 % (ref 3.0–12.0)
Neutro Abs: 2.3 10*3/uL (ref 1.4–7.7)
Neutrophils Relative %: 51.5 % (ref 43.0–77.0)
Platelets: 199 10*3/uL (ref 150.0–400.0)
RBC: 4.64 Mil/uL (ref 3.87–5.11)
RDW: 13.6 % (ref 11.5–15.5)
WBC: 4.5 10*3/uL (ref 4.0–10.5)

## 2016-07-31 LAB — LIPID PANEL
Cholesterol: 210 mg/dL — ABNORMAL HIGH (ref 0–200)
HDL: 58.4 mg/dL (ref >39.00)
LDL Cholesterol: 134 mg/dL — ABNORMAL HIGH (ref 0–99)
NonHDL: 151.1
Total CHOL/HDL Ratio: 4
Triglycerides: 86 mg/dL (ref 0.0–149.0)
VLDL: 17.2 mg/dL (ref 0.0–40.0)

## 2016-07-31 LAB — COMPREHENSIVE METABOLIC PANEL
ALT: 16 U/L (ref 0–35)
AST: 20 U/L (ref 0–37)
Albumin: 4.3 g/dL (ref 3.5–5.2)
Alkaline Phosphatase: 60 U/L (ref 39–117)
BUN: 15 mg/dL (ref 6–23)
CO2: 30 mEq/L (ref 19–32)
Calcium: 9.8 mg/dL (ref 8.4–10.5)
Chloride: 105 mEq/L (ref 96–112)
Creatinine, Ser: 0.77 mg/dL (ref 0.40–1.20)
GFR: 78.23 mL/min (ref >60.00)
Glucose, Bld: 99 mg/dL (ref 70–99)
Potassium: 4.4 mEq/L (ref 3.5–5.1)
Sodium: 141 mEq/L (ref 135–145)
Total Bilirubin: 0.7 mg/dL (ref 0.2–1.2)
Total Protein: 7.4 g/dL (ref 6.0–8.3)

## 2016-07-31 LAB — VITAMIN D 25 HYDROXY (VIT D DEFICIENCY, FRACTURES): VITD: 29.56 ng/mL — ABNORMAL LOW (ref 30.00–100.00)

## 2016-07-31 LAB — HEMOGLOBIN A1C: Hgb A1c MFr Bld: 5.7 % (ref 4.6–6.5)

## 2016-07-31 LAB — TSH: TSH: 0.98 u[IU]/mL (ref 0.35–4.50)

## 2016-07-31 NOTE — Patient Instructions (Signed)
Ms. Vandevander , Thank you for taking time to come for your Medicare Wellness Visit. I appreciate your ongoing commitment to your health goals. Please review the following plan we discussed and let me know if I can assist you in the future.   These are the goals we discussed: Goals    . Increase physical activity          Starting 07/31/2016, I will continue participation in prediabetes class as scheduled.        This is a list of the screening recommended for you and due dates:  Health Maintenance  Topic Date Due  . Mammogram  04/18/2017  . Tetanus Vaccine  11/05/2020  . Colon Cancer Screening  03/24/2022  . Flu Shot  Addressed  . DEXA scan (bone density measurement)  Completed  . Shingles Vaccine  Completed  .  Hepatitis C: One time screening is recommended by Center for Disease Control  (CDC) for  adults born from [birth year] through 1967.   Completed  . Pneumonia vaccines  Completed   Preventive Care for Adults  A healthy lifestyle and preventive care can promote health and wellness. Preventive health guidelines for adults include the following key practices.  . A routine yearly physical is a good way to check with your health care provider about your health and preventive screening. It is a chance to share any concerns and updates on your health and to receive a thorough exam.  . Visit your dentist for a routine exam and preventive care every 6 months. Brush your teeth twice a day and floss once a day. Good oral hygiene prevents tooth decay and gum disease.  . The frequency of eye exams is based on your age, health, family medical history, use  of contact lenses, and other factors. Follow your health care provider's ecommendations for frequency of eye exams.  . Eat a healthy diet. Foods like vegetables, fruits, whole grains, low-fat dairy products, and lean protein foods contain the nutrients you need without too many calories. Decrease your intake of foods high in solid fats, added  sugars, and salt. Eat the right amount of calories for you. Get information about a proper diet from your health care provider, if necessary.  . Regular physical exercise is one of the most important things you can do for your health. Most adults should get at least 150 minutes of moderate-intensity exercise (any activity that increases your heart rate and causes you to sweat) each week. In addition, most adults need muscle-strengthening exercises on 2 or more days a week.  Silver Sneakers may be a benefit available to you. To determine eligibility, you may visit the website: www.silversneakers.com or contact program at 712-387-2711 Mon-Fri between 8AM-8PM.   . Maintain a healthy weight. The body mass index (BMI) is a screening tool to identify possible weight problems. It provides an estimate of body fat based on height and weight. Your health care provider can find your BMI and can help you achieve or maintain a healthy weight.   For adults 20 years and older: ? A BMI below 18.5 is considered underweight. ? A BMI of 18.5 to 24.9 is normal. ? A BMI of 25 to 29.9 is considered overweight. ? A BMI of 30 and above is considered obese.   . Maintain normal blood lipids and cholesterol levels by exercising and minimizing your intake of saturated fat. Eat a balanced diet with plenty of fruit and vegetables. Blood tests for lipids and cholesterol should begin at  age 55 and be repeated every 5 years. If your lipid or cholesterol levels are high, you are over 50, or you are at high risk for heart disease, you may need your cholesterol levels checked more frequently. Ongoing high lipid and cholesterol levels should be treated with medicines if diet and exercise are not working.  . If you smoke, find out from your health care provider how to quit. If you do not use tobacco, please do not start.  . If you choose to drink alcohol, please do not consume more than 2 drinks per day. One drink is considered to  be 12 ounces (355 mL) of beer, 5 ounces (148 mL) of wine, or 1.5 ounces (44 mL) of liquor.  . If you are 76-73 years old, ask your health care provider if you should take aspirin to prevent strokes.  . Use sunscreen. Apply sunscreen liberally and repeatedly throughout the day. You should seek shade when your shadow is shorter than you. Protect yourself by wearing long sleeves, pants, a wide-brimmed hat, and sunglasses year round, whenever you are outdoors.  . Once a month, do a whole body skin exam, using a mirror to look at the skin on your back. Tell your health care provider of new moles, moles that have irregular borders, moles that are larger than a pencil eraser, or moles that have changed in shape or color.

## 2016-07-31 NOTE — Progress Notes (Signed)
PCP notes:   Health maintenance:  Flu vaccine -per pt, administered in Nov 2017 Hep C screening - completed PPSV23 - administered  Abnormal screenings:   Hearing - failed Mini-Cog score: 19/20  Patient concerns:   None  Nurse concerns:  None  Next PCP appt:   08/04/16 @ 0800

## 2016-07-31 NOTE — Progress Notes (Signed)
Pre visit review using our clinic review tool, if applicable. No additional management support is needed unless otherwise documented below in the visit note. 

## 2016-07-31 NOTE — Progress Notes (Signed)
Subjective:   Jody Avery is a 70 y.o. female who presents for Medicare Annual (Subsequent) preventive examination.  Review of Systems:  N/A Cardiac Risk Factors include: advanced age (>66men, >35 women);dyslipidemia;obesity (BMI >30kg/m2)     Objective:     Vitals: BP 120/78 (BP Location: Left Arm, Patient Position: Sitting, Cuff Size: Normal)   Pulse 72   Temp 98 F (36.7 C) (Oral)   Ht 5' 2.25" (1.581 m)   Wt 176 lb 4 oz (79.9 kg)   SpO2 96%   BMI 31.98 kg/m   Body mass index is 31.98 kg/m.   Tobacco History  Smoking Status  . Never Smoker  Smokeless Tobacco  . Never Used     Counseling given: No   Past Medical History:  Diagnosis Date  . Allergy    allergic rhinitis  . Asthma   . DDD (degenerative disc disease)    low back pain deg disc dz with infections  . Hearing loss    mild  . Hyperlipidemia   . Osteopenia   . Osteoporosis   . Tinnitus    Past Surgical History:  Procedure Laterality Date  . ABDOMINAL HYSTERECTOMY  1989   total, fibroids  . KNEE CARTILAGE SURGERY    . torn meniscus  2007   left    Family History  Problem Relation Age of Onset  . Osteoporosis Mother   . Cancer Mother     ? liver cancer ? primary  . Heart disease Father     MI  . Osteoporosis Sister   . Diabetes Sister   . Heart disease Sister     CHF  . Heart disease Brother     CAD  . Heart disease Brother     CAD  . Osteoporosis Sister   . Diabetes Sister    History  Sexual Activity  . Sexual activity: No    Outpatient Encounter Prescriptions as of 07/31/2016  Medication Sig  . albuterol (PROVENTIL HFA;VENTOLIN HFA) 108 (90 BASE) MCG/ACT inhaler Inhale into the lungs as needed.   . fexofenadine (ALLEGRA ODT) 30 MG disintegrating tablet Take 60 mg by mouth daily as needed.    . fluticasone (FLONASE) 50 MCG/ACT nasal spray Place 2 sprays into the nose as needed.   . Fluticasone-Salmeterol (ADVAIR DISKUS) 100-50 MCG/DOSE AEPB Inhale 1 puff into the  lungs every 12 (twelve) hours.    . Multiple Vitamins-Minerals (HAIR/SKIN/NAILS PO) Take 2 tablets by mouth daily.   . [DISCONTINUED] calcium-vitamin D (OSCAL WITH D) 500-200 MG-UNIT per tablet Take 1 tablet by mouth.  . [DISCONTINUED] CHERRY PO Take 1 capsule by mouth daily. 1200mg   . [DISCONTINUED] Cholecalciferol (VITAMIN D-3) 1000 UNITS CAPS Take 3 capsules by mouth daily.   . [DISCONTINUED] Cholecalciferol 1000 UNITS capsule Take 1,000 Units by mouth daily.   . [DISCONTINUED] Flaxseed, Linseed, (FLAXSEED OIL) 1000 MG CAPS Take 1 capsule by mouth daily.  . [DISCONTINUED] Multiple Vitamins-Minerals (HEALTHY EYES PO) Take 1 capsule by mouth daily.  . [DISCONTINUED] Pyridoxine HCl (VITAMIN B-6 PO) Take 1 tablet by mouth daily.  . [DISCONTINUED] Turmeric 500 MG CAPS Take 1 capsule by mouth daily.   No facility-administered encounter medications on file as of 07/31/2016.     Activities of Daily Living In your present state of health, do you have any difficulty performing the following activities: 07/31/2016  Hearing? Y  Vision? N  Difficulty concentrating or making decisions? N  Walking or climbing stairs? N  Dressing or bathing? N  Doing errands, shopping? N  Preparing Food and eating ? N  Using the Toilet? N  In the past six months, have you accidently leaked urine? N  Do you have problems with loss of bowel control? N  Managing your Medications? N  Managing your Finances? N  Housekeeping or managing your Housekeeping? N  Some recent data might be hidden    Patient Care Team: Abner Greenspan, MD as PCP - General    Assessment:     Hearing Screening   125Hz  250Hz  500Hz  1000Hz  2000Hz  3000Hz  4000Hz  6000Hz  8000Hz   Right ear:   40 40 40  0    Left ear:   40 0 40  0    Comments: Ringing in left ear  Vision Screening Comments: Future appt scheduled12/2017 with Dr. Matilde Sprang   Exercise Activities and Dietary recommendations Current Exercise Habits: Home exercise routine, Type of  exercise: walking;Other - see comments (stationary bike, water aerobics), Time (Minutes): 60, Frequency (Times/Week): 6, Weekly Exercise (Minutes/Week): 360, Intensity: Moderate, Exercise limited by: None identified  Goals    . Increase physical activity          Starting 07/31/2016, I will continue participation in prediabetes class as scheduled.       Fall Risk Fall Risk  07/31/2016 07/31/2015 07/05/2014 02/17/2013  Falls in the past year? No Yes Yes No  Number falls in past yr: - 1 1 -  Injury with Fall? - No Yes -   Depression Screen PHQ 2/9 Scores 07/31/2016 07/31/2015 07/05/2014 02/17/2013  PHQ - 2 Score 0 0 0 0     Cognitive Function MMSE - Mini Mental State Exam 07/31/2016  Orientation to time 5  Orientation to Place 5  Registration 3  Attention/ Calculation 0  Recall 2  Language- name 2 objects 0  Language- repeat 1  Language- follow 3 step command 3  Language- read & follow direction 0  Write a sentence 0  Copy design 0  Total score 19     PLEASE NOTE: A Mini-Cog screen was completed. Maximum score is 20. A value of 0 denotes this part of Folstein MMSE was not completed or the patient failed this part of the Mini-Cog screening.   Mini-Cog Screening Orientation to Time - Max 5 pts Orientation to Place - Max 5 pts Registration - Max 3 pts Recall - Max 3 pts Language Repeat - Max 1 pts Language Follow 3 Step Command - Max 3 pts     Immunization History  Administered Date(s) Administered  . Influenza Whole 06/01/2008, 07/02/2010  . Influenza, Seasonal, Injecte, Preservative Fre 07/11/2014, 06/30/2015  . Influenza-Unspecified 07/02/2013  . Pneumococcal Conjugate-13 07/05/2014  . Pneumococcal Polysaccharide-23 07/26/2008, 07/31/2016  . Td 04/29/1998, 04/30/2007, 11/06/2010  . Zoster 08/30/2007   Screening Tests Health Maintenance  Topic Date Due  . MAMMOGRAM  04/18/2017  . TETANUS/TDAP  11/05/2020  . COLONOSCOPY  03/24/2022  . INFLUENZA VACCINE   Addressed  . DEXA SCAN  Completed  . ZOSTAVAX  Completed  . Hepatitis C Screening  Completed  . PNA vac Low Risk Adult  Completed      Plan:  I have personally reviewed and addressed the Medicare Annual Wellness questionnaire and have noted the following in the patient's chart:  A. Medical and social history B. Use of alcohol, tobacco or illicit drugs  C. Current medications and supplements D. Functional ability and status E.  Nutritional status F.  Physical activity G. Advance directives H. List of other physicians  I.  Hospitalizations, surgeries, and ER visits in previous 12 months J.  Vitals K. Screenings to include hearing, vision, cognitive, depression L. Referrals and appointments - none  In addition, I have reviewed and discussed with patient certain preventive protocols, quality metrics, and best practice recommendations. A written personalized care plan for preventive services as well as general preventive health recommendations were provided to patient.  See attached scanned questionnaire for additional information.   Signed,   Lindell Noe, MHA, BS, LPN Health Coach

## 2016-08-01 LAB — HEPATITIS C ANTIBODY: HCV Ab: NEGATIVE

## 2016-08-01 NOTE — Progress Notes (Signed)
Medical screening examination/treatment/procedure(s) were performed by registered nurse and as supervising non-physician practitioner I was immediately available for consultation/collaboration.  BAITY, REGINA, NP  

## 2016-08-04 ENCOUNTER — Encounter: Payer: Self-pay | Admitting: Family Medicine

## 2016-08-04 ENCOUNTER — Ambulatory Visit (INDEPENDENT_AMBULATORY_CARE_PROVIDER_SITE_OTHER): Payer: Medicare PPO | Admitting: Family Medicine

## 2016-08-04 VITALS — BP 132/78 | HR 76 | Ht 62.25 in | Wt 179.0 lb

## 2016-08-04 DIAGNOSIS — E6609 Other obesity due to excess calories: Secondary | ICD-10-CM

## 2016-08-04 DIAGNOSIS — M8588 Other specified disorders of bone density and structure, other site: Secondary | ICD-10-CM

## 2016-08-04 DIAGNOSIS — Z0000 Encounter for general adult medical examination without abnormal findings: Secondary | ICD-10-CM | POA: Diagnosis not present

## 2016-08-04 DIAGNOSIS — E559 Vitamin D deficiency, unspecified: Secondary | ICD-10-CM | POA: Diagnosis not present

## 2016-08-04 DIAGNOSIS — E7800 Pure hypercholesterolemia, unspecified: Secondary | ICD-10-CM

## 2016-08-04 DIAGNOSIS — E669 Obesity, unspecified: Secondary | ICD-10-CM | POA: Insufficient documentation

## 2016-08-04 DIAGNOSIS — Z6832 Body mass index (BMI) 32.0-32.9, adult: Secondary | ICD-10-CM

## 2016-08-04 NOTE — Progress Notes (Signed)
Subjective:    Patient ID: Jody Avery, female    DOB: 14-Aug-1946, 70 y.o.   MRN: [id number]  HPI  Here for health maintenance exam and to review chronic medical problems    Feeling pretty good overall   Had her AMW 11/30  Had her flu shot and hep C screen and PPSV23  Failed hearing -she has noticed some hearing changes- she has intermittent tinnitus L ear (has been seen for that in the past)  She may get re eval in the future   Mini cog score 19/20- missed one recall  Remembered the word a minute or two later   Wt Readings from Last 3 Encounters:  08/04/16 179 lb (81.2 kg)  07/31/16 176 lb 4 oz (79.9 kg)  07/31/15 194 lb 12 oz (88.3 kg)  still working hard on diet  Going to a pre diabetic class that the hospital sponsored - has learned a lot  Eating more fruit /veg and lean protein  bmi is 32.4 Happy with wt loss so far but she is frustrated with the plateau   Mammogram 8/17-neg Self exam- no lumps Has had a hysterectomy and no gyn symptoms   Colonoscopy 7/13= normal (10 year recall) -probably not due to age  dexa 4/14 OP spine (did not want to pursue further for now)  She had side eff evista and bisphosphente (GI and body pain) No fractures One fall  D level low at 29.5- had stopped her D (will get back on it)  Nervous about trying prolia or other medicines   Hx of hyperlipidemia Lab Results  Component Value Date   CHOL 210 (H) 07/31/2016   CHOL 205 (H) 07/24/2015   CHOL 198 06/28/2014   Lab Results  Component Value Date   HDL 58.40 07/31/2016   HDL 46.60 07/24/2015   HDL 50.70 06/28/2014   Lab Results  Component Value Date   LDLCALC 134 (H) 07/31/2016   LDLCALC 127 (H) 07/24/2015   LDLCALC 124 (H) 06/28/2014   Lab Results  Component Value Date   TRIG 86.0 07/31/2016   TRIG 159.0 (H) 07/24/2015   TRIG 119.0 06/28/2014   Lab Results  Component Value Date   CHOLHDL 4 07/31/2016   CHOLHDL 4 07/24/2015   CHOLHDL 4 06/28/2014   Lab  Results  Component Value Date   LDLDIRECT 137.0 02/14/2013   LDLDIRECT 149.5 05/10/2012   LDLDIRECT 149.1 11/04/2011    Trig down/ HDL up  LDL up a bit - this frustrates her because diet is great - but most likely hereditary high cholesterol   Results for orders placed or performed in visit on 07/31/16  TSH  Result Value Ref Range   TSH 0.98 0.35 - 4.50 uIU/mL  Hepatitis C antibody  Result Value Ref Range   HCV Ab NEGATIVE NEGATIVE  Comprehensive metabolic panel  Result Value Ref Range   Sodium 141 135 - 145 mEq/L   Potassium 4.4 3.5 - 5.1 mEq/L   Chloride 105 96 - 112 mEq/L   CO2 30 19 - 32 mEq/L   Glucose, Bld 99 70 - 99 mg/dL   BUN 15 6 - 23 mg/dL   Creatinine, Ser 0.77 0.40 - 1.20 mg/dL   Total Bilirubin 0.7 0.2 - 1.2 mg/dL   Alkaline Phosphatase 60 39 - 117 U/L   AST 20 0 - 37 U/L   ALT 16 0 - 35 U/L   Total Protein 7.4 6.0 - 8.3 g/dL   Albumin 4.3 3.5 -  5.2 g/dL   Calcium 9.8 8.4 - 10.5 mg/dL   GFR 78.23 >60.00 mL/min  CBC with Differential/Platelet  Result Value Ref Range   WBC 4.5 4.0 - 10.5 K/uL   RBC 4.64 3.87 - 5.11 Mil/uL   Hemoglobin 14.0 12.0 - 15.0 g/dL   HCT 41.6 36.0 - 46.0 %   MCV 89.7 78.0 - 100.0 fl   MCHC 33.6 30.0 - 36.0 g/dL   RDW 13.6 11.5 - 15.5 %   Platelets 199.0 150.0 - 400.0 K/uL   Neutrophils Relative % 51.5 43.0 - 77.0 %   Lymphocytes Relative 33.8 12.0 - 46.0 %   Monocytes Relative 11.3 3.0 - 12.0 %   Eosinophils Relative 2.6 0.0 - 5.0 %   Basophils Relative 0.8 0.0 - 3.0 %   Neutro Abs 2.3 1.4 - 7.7 K/uL   Lymphs Abs 1.5 0.7 - 4.0 K/uL   Monocytes Absolute 0.5 0.1 - 1.0 K/uL   Eosinophils Absolute 0.1 0.0 - 0.7 K/uL   Basophils Absolute 0.0 0.0 - 0.1 K/uL  Lipid Panel  Result Value Ref Range   Cholesterol 210 (H) 0 - 200 mg/dL   Triglycerides 86.0 0.0 - 149.0 mg/dL   HDL 58.40 >39.00 mg/dL   VLDL 17.2 0.0 - 40.0 mg/dL   LDL Cholesterol 134 (H) 0 - 99 mg/dL   Total CHOL/HDL Ratio 4    NonHDL 151.10   Hemoglobin A1c  Result  Value Ref Range   Hgb A1c MFr Bld 5.7 4.6 - 6.5 %  Vitamin D, 25-hydroxy  Result Value Ref Range   VITD 29.56 (L) 30.00 - 100.00 ng/mL    A1C is very well controlled with better diet and DM education program and exercise  Patient Active Problem List   Diagnosis Date Noted  . Routine general medical examination at a health care facility 07/23/2015  . Encounter for Medicare annual wellness exam 02/13/2013  . Other screening mammogram 10/18/2012  . Knee pain 11/11/2011  . Hand pain 03/14/2011  . Joint pain 03/11/2011  . ARTHRALGIA 07/31/2009  . [place] DISEASE, LUMBAR 04/15/2007  . Hyperlipidemia 04/06/2007  . ALLERGIC RHINITIS 04/06/2007  . ASTHMA 04/06/2007  . Osteopenia 04/06/2007  . MURMUR 04/06/2007   Past Medical History:  Diagnosis Date  . Allergy    allergic rhinitis  . Asthma   . DDD (degenerative disc disease)    low back pain deg disc dz with infections  . Hearing loss    mild  . Hyperlipidemia   . Osteopenia   . Osteoporosis   . Tinnitus    Past Surgical History:  Procedure Laterality Date  . ABDOMINAL HYSTERECTOMY  1989   total, fibroids  . KNEE CARTILAGE SURGERY    . torn meniscus  2007   left    Social History  Substance Use Topics  . Smoking status: Never Smoker  . Smokeless tobacco: Never Used  . Alcohol use No   Family History  Problem Relation Age of Onset  . Osteoporosis Mother   . Cancer Mother     ? liver cancer ? primary  . Heart disease Father     MI  . Osteoporosis Sister   . Diabetes Sister   . Heart disease Sister     CHF  . Heart disease Brother     CAD  . Heart disease Brother     CAD  . Osteoporosis Sister   . Diabetes Sister    Allergies  Allergen Reactions  . Alendronate Sodium  REACTION: GI  . Azithromycin     REACTION: GI upset  . Clarithromycin     REACTION: stomach upset  . Codeine     REACTION: nausea and vomiting  . Diclofenac Sodium     REACTION: hands itching  . Fish Oil     REACTION: reflux  .  Meloxicam Itching    Hands itched. And urine was rusty colored.  [name] Penicillins     REACTION: mouth swelling  . Rabeprazole Sodium     REACTION: GI  . Raloxifene     REACTION: leg pain and cramps  . Sulfonamide Derivatives     REACTION: rash   Current Outpatient Prescriptions on File Prior to Visit  Medication Sig Dispense Refill  . albuterol (PROVENTIL HFA;VENTOLIN HFA) 108 (90 BASE) MCG/ACT inhaler Inhale into the lungs as needed.     . fexofenadine (ALLEGRA ODT) 30 MG disintegrating tablet Take 60 mg by mouth daily as needed.      . fluticasone (FLONASE) 50 MCG/ACT nasal spray Place 2 sprays into the nose as needed.     . Fluticasone-Salmeterol (ADVAIR DISKUS) 100-50 MCG/DOSE AEPB Inhale 1 puff into the lungs Avery 12 (twelve) hours.      . Multiple Vitamins-Minerals (HAIR/SKIN/NAILS PO) Take 2 tablets by mouth daily.      No current facility-administered medications on file prior to visit.      Review of Systems    Review of Systems  Constitutional: Negative for fever, appetite change, fatigue and unexpected weight change.  Eyes: Negative for pain and visual disturbance.  Respiratory: Negative for cough and shortness of breath.   Cardiovascular: Negative for cp or palpitations    Gastrointestinal: Negative for nausea, diarrhea and constipation.  Genitourinary: Negative for urgency and frequency.  Skin: Negative for pallor or rash   MSK pos for aches and pains/neg for fractures  Neurological: Negative for weakness, light-headedness, numbness and headaches.  Hematological: Negative for adenopathy. Does not bruise/bleed easily.  Psychiatric/Behavioral: Negative for dysphoric mood. The patient is not nervous/anxious.      Objective:   Physical Exam  Constitutional: She appears well-developed and well-nourished. No distress.  obese and well appearing   HENT:  Head: Normocephalic and atraumatic.  Right Ear: External ear normal.  Left Ear: External ear normal.  Mouth/Throat:  Oropharynx is clear and moist.  Eyes: Conjunctivae and EOM are normal. Pupils are equal, round, and reactive to light. No scleral icterus.  Neck: Normal range of motion. Neck supple. No JVD present. Carotid bruit is not present. No thyromegaly present.  Cardiovascular: Normal rate, regular rhythm and intact distal pulses.  Exam reveals no gallop.   Murmur heard. Pulmonary/Chest: Effort normal and breath sounds normal. No respiratory distress. She has no wheezes. She exhibits no tenderness.  Abdominal: Soft. Bowel sounds are normal. She exhibits no distension, no abdominal bruit and no mass. There is no tenderness.  Genitourinary: No breast swelling, tenderness, discharge or bleeding.  Genitourinary Comments: Breast exam: No mass, nodules, thickening, tenderness, bulging, retraction, inflamation, nipple discharge or skin changes noted.  No axillary or clavicular LA.      Musculoskeletal: Normal range of motion. She exhibits no edema or tenderness.  No kyphosis   Lymphadenopathy:    She has no cervical adenopathy.  Neurological: She is alert. She has normal reflexes. No cranial nerve deficit. She exhibits normal muscle tone. Coordination normal.  Skin: Skin is warm and dry. No rash noted. No erythema. No pallor.  Fair complexion Few lentigines  Psychiatric:  She has a normal mood and affect.          Assessment & Plan:   Problem List Items Addressed This Visit      Musculoskeletal and Integument   Osteopenia - Primary    Pt unsure if she wants to eval further or tx Will look up info on prolia and decide  Will start back on D  Also exercise  No fractures, one fall Disc fall precautions  Disc need for calcium/ vitamin D/ wt bearing exercise and bone density test Avery 2 y to monitor Disc safety/ fracture risk in detail          Other   Vitamin D deficiency    Level of 29.5 Urged her to get back on D3 -suggest 4000 iu more daily  Disc imp to bone and overall health        Routine general medical examination at a health care facility    Reviewed health habits including diet and exercise and skin cancer prevention Reviewed appropriate screening tests for age  Also reviewed health mt list, fam hx and immunization status , as well as social and family history   AMW reviewed Labs reviewed See HPI Overall doing well  She will consider Prolia for OP after looking into it a bit (if covered by ins) Disc fall precautions  Will add additional vit D 4000 iu daily for low level Urged to keep up good health habits and wt loss       Obesity    Commended on great wt loss so far  Urged to keep it up   Discussed how this problem influences overall health and the risks it imposes  Reviewed plan for weight loss with lower calorie diet (via better food choices and also portion control or program like weight watchers) and exercise building up to or more than 30 minutes 5 days per week including some aerobic activity         Hyperlipidemia    Disc goals for lipids and reasons to control them Rev labs with pt Rev low sat fat diet in detail HDL is up nicely with exercise  Trig down with lower carb diet  LDL is 134- goal under 130 will keep working on it Good diet- may be hereditary

## 2016-08-04 NOTE — Assessment & Plan Note (Signed)
Disc goals for lipids and reasons to control them Rev labs with pt Rev low sat fat diet in detail HDL is up nicely with exercise  Trig down with lower carb diet  LDL is 134- goal under 130 will keep working on it Good diet- may be hereditary

## 2016-08-04 NOTE — Assessment & Plan Note (Addendum)
Pt unsure if she wants to eval further or tx Will look up info on prolia and decide  Will start back on D  Also exercise  No fractures, one fall Disc fall precautions  Disc need for calcium/ vitamin D/ wt bearing exercise and bone density test every 2 y to monitor Disc safety/ fracture risk in detail

## 2016-08-04 NOTE — Assessment & Plan Note (Signed)
Reviewed health habits including diet and exercise and skin cancer prevention Reviewed appropriate screening tests for age  Also reviewed health mt list, fam hx and immunization status , as well as social and family history   AMW reviewed Labs reviewed See HPI Overall doing well  She will consider Prolia for OP after looking into it a bit (if covered by ins) Disc fall precautions  Will add additional vit D 4000 iu daily for low level Urged to keep up good health habits and wt loss

## 2016-08-04 NOTE — Assessment & Plan Note (Signed)
Commended on great wt loss so far  Urged to keep it up   Discussed how this problem influences overall health and the risks it imposes  Reviewed plan for weight loss with lower calorie diet (via better food choices and also portion control or program like weight watchers) and exercise building up to or more than 30 minutes 5 days per week including some aerobic activity

## 2016-08-04 NOTE — Assessment & Plan Note (Signed)
Level of 29.5 Urged her to get back on D3 -suggest 4000 iu more daily  Disc imp to bone and overall health

## 2016-08-04 NOTE — Patient Instructions (Addendum)
The medicine I would consider for osteoporosis is [place] (Prolia) if you want to look it up  Get back on vitamin D at least 4000 iu per day  For cholesterol    Avoid red meat/ fried foods/ egg yolks/ fatty breakfast meats/ butter, cheese and high fat dairy/ and shellfish    Keep exercising !!! [place] job with weight loss and fitness and better diet

## 2016-08-04 NOTE — Progress Notes (Signed)
Pre visit review using our clinic review tool, if applicable. No additional management support is needed unless otherwise documented below in the visit note. 

## 2016-09-12 ENCOUNTER — Encounter: Payer: Medicare PPO | Admitting: Family Medicine

## 2017-01-06 ENCOUNTER — Telehealth: Payer: Self-pay

## 2017-01-06 NOTE — Telephone Encounter (Signed)
This did not come to my desktop. Dr Glori Bickers has already seen phone note.     PLEASE NOTE: All timestamps contained within this report are represented as [place] Standard Time. CONFIDENTIALTY NOTICE: This fax transmission is intended only for the addressee. It contains information that is legally privileged, confidential or otherwise protected from use or disclosure. If you are not the intended recipient, you are strictly prohibited from reviewing, disclosing, copying using or disseminating any of this information or taking any action in reliance on or regarding this information. If you have received this fax in error, please notify us immediately by telephone so that we can arrange for its return to [place]. Phone: [phone], Toll-Free: [phone], Fax: [phone] Page: 1 of 1 Call Id: [id number] [place] Patient Name: Jody Avery Gender: Unknown DOB: December 06, 1945 Age: 71 Y 10 M 7 D Return Phone Number: [phone] (Primary) City/State/Zip: [place] [address] Client [facility] - Client Client Site [employer] Physician Glori Bickers, Roque Lias - MD Who Is Calling Patient / Member / Family / Caregiver Call Type Triage / Clinical Relationship To Patient Self Return Phone Number [phone] (Primary) Chief Complaint Cuts and Lacerations Reason for Call Symptomatic / Request for Health Information Initial Comment caller states she had tetanus 10/2010. She sliced her finger last night . its not bleeding .She wants to know if she needs another tetanus shot Appointment Disposition EMR Caller Not Reached Info pasted into Epic No Nurse Assessment Guidelines Guideline Title Affirmed Question Disp. Time Eilene Ghazi Time) Disposition Final User 01/06/2017 11:17:00 AM FINAL ATTEMPT MADE - no message left Yes Markus Daft, RN, Sherre Poot Comments User: Mayford Knife, RN  Date/Time Eilene Ghazi Time): 01/06/2017 11:08:30 AM [phone] - non working #. Will have rep to verify. User: Caryl Comes Date/Time Eilene Ghazi Time): 01/06/2017 11:12:47 AM Correct per recording. User: Mayford Knife, RN Date/Time Eilene Ghazi Time): 01/06/2017 11:17:21 AM Cont. to be a non working #. Unable to reach patient.

## 2017-01-06 NOTE — Telephone Encounter (Signed)
Pt cut finger near the nail last night at 6 pm; pt ran water over cut but was hurting so badly did not clean the area. Covered with bandage and finally stopped bleeding. Pt wants to know when had last tetanus shot. Advised pt per immunization record had Tdap 11/06/10; pt does need tetanus booster and will need to have finger evaluated; pt not sure if needs stitches or not and I advised even if not need stitches; area needs to be cleaned with more than just water. No available appts at [facility] and pt will go to [facility] walk in clinic; advised if needs stitches and needs tetanus before 6 pm tonight. Pt voiced understanding and will go to [facility] walkin in now. FYI to Dr Glori Bickers.

## 2017-01-06 NOTE — Telephone Encounter (Signed)
Aware-thanks  Will watch for notes

## 2017-01-20 ENCOUNTER — Encounter (INDEPENDENT_AMBULATORY_CARE_PROVIDER_SITE_OTHER): Payer: Self-pay | Admitting: Vascular Surgery

## 2017-01-20 ENCOUNTER — Ambulatory Visit (INDEPENDENT_AMBULATORY_CARE_PROVIDER_SITE_OTHER): Payer: Medicare PPO | Admitting: Vascular Surgery

## 2017-01-20 VITALS — BP 165/92 | HR 80 | Resp 16 | Ht 62.0 in | Wt 186.0 lb

## 2017-01-20 DIAGNOSIS — M5137 Other intervertebral disc degeneration, lumbosacral region: Secondary | ICD-10-CM

## 2017-01-20 DIAGNOSIS — I83813 Varicose veins of bilateral lower extremities with pain: Secondary | ICD-10-CM | POA: Diagnosis not present

## 2017-01-20 DIAGNOSIS — M79605 Pain in left leg: Secondary | ICD-10-CM

## 2017-01-20 DIAGNOSIS — M79604 Pain in right leg: Secondary | ICD-10-CM | POA: Diagnosis not present

## 2017-01-20 DIAGNOSIS — E7800 Pure hypercholesterolemia, unspecified: Secondary | ICD-10-CM

## 2017-01-20 DIAGNOSIS — M79609 Pain in unspecified limb: Secondary | ICD-10-CM | POA: Insufficient documentation

## 2017-01-20 NOTE — Assessment & Plan Note (Signed)
Could be multifactorial but certainly venous disease could be playing a major role.

## 2017-01-20 NOTE — Assessment & Plan Note (Signed)
See treatment plan as below 

## 2017-01-20 NOTE — Assessment & Plan Note (Signed)
lipid control important in reducing the progression of atherosclerotic disease.   

## 2017-01-20 NOTE — Assessment & Plan Note (Signed)
Status post back surgery previously. Could be contributing to some of her lower extremity symptoms of pain.

## 2017-01-20 NOTE — Progress Notes (Signed)
Patient ID: Jody Avery, female   DOB: [birth year], 71 y.o.   MRN: [id number]  Chief Complaint  Patient presents with  . New Evaluation    Possible Reflux-Vasc. screening    HPI Jody Avery is a 71 y.o. female.  The patient presents with complaints of symptomatic varicosities of the lower extremities.  She says her dermatologist recommended she come here due to the enlarging, prominent varicose veins. The patient reports a long standing history of varicosities and they have become painful over time. There was no clear inciting event or causative factor that started the symptoms.  The legs are affected about the same. The patient elevates the legs for relief. The pain is described as stinging and burning, particularly overlying the varicose veins. The symptoms are generally most severe in the evening, particularly when they have been on their feet for long periods of time. Stockings and exercise has been used to try to improve the symptoms with limited success. The patient complains of frequent swelling as an associated symptom. The patient has no previous history of deep venous thrombosis or superficial thrombophlebitis to their knowledge.     Past Medical History:  Diagnosis Date  . Allergy    allergic rhinitis  . Asthma   . DDD (degenerative disc disease)    low back pain deg disc dz with infections  . Hearing loss    mild  . Hyperlipidemia   . Osteopenia   . Osteoporosis   . Tinnitus     Past Surgical History:  Procedure Laterality Date  . ABDOMINAL HYSTERECTOMY  1989   total, fibroids  . KNEE CARTILAGE SURGERY    . torn meniscus  2007   left     Family History  Problem Relation Age of Onset  . Osteoporosis Mother   . Cancer Mother        ? liver cancer ? primary  . Heart disease Father        MI  . Osteoporosis Sister   . Diabetes Sister   . Heart disease Sister        CHF  . Heart disease Brother        CAD  . Heart disease Brother    CAD  . Osteoporosis Sister   . Diabetes Sister      Social History Social History  Substance Use Topics  . Smoking status: Never Smoker  . Smokeless tobacco: Never Used  . Alcohol use No   No IVDU  Allergies  Allergen Reactions  . Alendronate Sodium     REACTION: GI  . Azithromycin     REACTION: GI upset  . Clarithromycin     REACTION: stomach upset  . Codeine     REACTION: nausea and vomiting  . Diclofenac Sodium     REACTION: hands itching  . Fish Oil     REACTION: reflux  . Meloxicam Itching    Hands itched. And urine was rusty colored.  [name] Penicillins     REACTION: mouth swelling  . Rabeprazole Sodium     REACTION: GI  . Raloxifene     REACTION: leg pain and cramps  . Sulfonamide Derivatives     REACTION: rash    Current Outpatient Prescriptions  Medication Sig Dispense Refill  . albuterol (PROVENTIL HFA;VENTOLIN HFA) 108 (90 BASE) MCG/ACT inhaler Inhale into the lungs as needed.     . fexofenadine (ALLEGRA ODT) 30 MG disintegrating tablet Take 60 mg by mouth daily as needed.      [name]  fluticasone (FLONASE) 50 MCG/ACT nasal spray Place 2 sprays into the nose as needed.     . Fluticasone-Salmeterol (ADVAIR DISKUS) 100-50 MCG/DOSE AEPB Inhale 1 puff into the lungs Avery 12 (twelve) hours.      . Multiple Vitamins-Minerals (HAIR/SKIN/NAILS PO) Take 2 tablets by mouth daily.      No current facility-administered medications for this visit.       REVIEW OF SYSTEMS (Negative unless checked)  Constitutional: [] Weight loss  [] Fever  [] Chills Cardiac: [] Chest pain   [] Chest pressure   [] Palpitations   [] Shortness of breath when laying flat   [] Shortness of breath at rest   [] Shortness of breath with exertion. Vascular:  [] Pain in legs with walking   [] Pain in legs at rest   [] Pain in legs when laying flat   [] Claudication   [] Pain in feet when walking  [] Pain in feet at rest  [] Pain in feet when laying flat   [] History of DVT   [] Phlebitis   [x] Swelling in legs    [x] Varicose veins   [] Non-healing ulcers Pulmonary:   [] Uses home oxygen   [] Productive cough   [] Hemoptysis   [] Wheeze  [] COPD   [] Asthma Neurologic:  [] Dizziness  [] Blackouts   [] Seizures   [] History of stroke   [] History of TIA  [] Aphasia   [] Temporary blindness   [] Dysphagia   [] Weakness or numbness in arms   [] Weakness or numbness in legs Musculoskeletal:  [x] Arthritis   [] Joint swelling   [] Joint pain   [] Low back pain Hematologic:  [] Easy bruising  [] Easy bleeding   [] Hypercoagulable state   [] Anemic  [] Hepatitis Gastrointestinal:  [] Blood in stool   [] Vomiting blood  [] Gastroesophageal reflux/heartburn   [] Abdominal pain Genitourinary:  [] Chronic kidney disease   [] Difficult urination  [] Frequent urination  [] Burning with urination   [] Hematuria Skin:  [] Rashes   [] Ulcers   [] Wounds Psychological:  [] History of anxiety   []  History of major depression.    Physical Exam BP (!) 165/92 (BP Location: Right Arm)   Pulse 80   Resp 16   Ht 5\' 2"  (1.575 m)   Wt 186 lb (84.4 kg)   BMI 34.02 kg/m  Gen:  WD/WN, NAD Head: [place]/AT, No temporalis wasting.  Ear/Nose/Throat: Hearing grossly intact, dentition good Eyes: Sclera non-icteric. Conjunctiva clear Neck: Supple, no nuchal rigidity. Trachea midline Pulmonary:  Good air movement, no use of accessory muscles, respirations not labored.  Cardiac: RRR, No JVD Vascular: Varicosities extensive and measuring up to 2-3 mm in the right lower extremity        Varicosities diffuse and measuring up to 3 mm in the left lower extremity Vessel Right Left  Radial Palpable Palpable  Ulnar Palpable Palpable  Brachial Palpable Palpable  Carotid Palpable, without bruit Palpable, without bruit  Aorta Not palpable N/A  Femoral Palpable Palpable  Popliteal Palpable Palpable  PT Palpable Palpable  DP Palpable Palpable   Gastrointestinal: soft, non-tender/non-distended.  Musculoskeletal: M/S 5/5 throughout.   Trace bilateral LE edema Neurologic:  Sensation grossly intact in extremities.  Symmetrical.  Speech is fluent.  Psychiatric: Judgment intact, Mood & affect appropriate for pt's clinical situation. Dermatologic: No rashes or ulcers noted.  No cellulitis or open wounds.    Radiology No results found.  Labs No results found for this or any previous visit (from the past 2160 hour(s)).  Assessment/Plan:  Hyperlipidemia lipid control important in reducing the progression of atherosclerotic disease.    DISC DISEASE, LUMBAR Status post back surgery previously. Could be contributing  to some of her lower extremity symptoms of pain.  Pain in limb Could be multifactorial but certainly venous disease could be playing a major role.  Varicose veins of leg with pain, bilateral See treatment plan as below.    The patient has symptoms consistent with chronic venous insufficiency. We discussed the natural history and treatment options for venous disease. I recommended the regular use of 20 - 30 mm Hg compression stockings, and prescribed these today. I recommended leg elevation and anti-inflammatories as needed for pain. I have also recommended a complete venous duplex to assess the venous system for reflux or thrombotic issues. This can be done at the patient's convenience. I will see the patient back in 3 months to assess the response to conservative management, and determine further treatment options.     Leotis Pain 01/20/2017, 4:25 PM   This note was created with Dragon medical transcription system.  Any errors from dictation are unintentional.

## 2017-01-20 NOTE — Patient Instructions (Signed)
Varicose Veins Varicose veins are veins that have become enlarged and twisted. They are usually seen in the legs but can occur in other parts of the body as well. What are the causes? This condition is the result of valves in the veins not working properly. Valves in the veins help to return blood from the leg to the heart. If these valves are damaged, blood flows backward and backs up into the veins in the leg near the skin. This causes the veins to become larger. What increases the risk? People who are on their feet a lot, who are pregnant, or who are overweight are more likely to develop varicose veins. What are the signs or symptoms?  Bulging, twisted-appearing, bluish veins, most commonly found on the legs.  Leg pain or a feeling of heaviness. These symptoms may be worse at the end of the day.  Leg swelling.  Changes in skin color. How is this diagnosed? A health care provider can usually diagnose varicose veins by examining your legs. Your health care provider may also recommend an ultrasound of your leg veins. How is this treated? Most varicose veins can be treated at home.However, other treatments are available for people who have persistent symptoms or want to improve the cosmetic appearance of the varicose veins. These treatment options include:  Sclerotherapy. A solution is injected into the vein to close it off.  Laser treatment. A laser is used to heat the vein to close it off.  Radiofrequency vein ablation. An electrical current produced by radio waves is used to close off the vein.  Phlebectomy. The vein is surgically removed through small incisions made over the varicose vein.  Vein ligation and stripping. The vein is surgically removed through incisions made over the varicose vein after the vein has been tied (ligated). Follow these instructions at home:   Do not stand or sit in one position for long periods of time. Do not sit with your legs crossed. Rest with your  legs raised during the day.  Wear compression stockings as directed by your health care provider. These stockings help to prevent blood clots and reduce swelling in your legs.  Do not wear other tight, encircling garments around your legs, pelvis, or waist.  Walk as much as possible to increase blood flow.  Raise the foot of your bed at night with 2-inch blocks.  If you get a cut in the skin over the vein and the vein bleeds, lie down with your leg raised and press on it with a clean cloth until the bleeding stops. Then place a bandage (dressing) on the cut. See your health care provider if it continues to bleed. Contact a health care provider if:  The skin around your ankle starts to break down.  You have pain, redness, tenderness, or hard swelling in your leg over a vein.  You are uncomfortable because of leg pain. This information is not intended to replace advice given to you by your health care provider. Make sure you discuss any questions you have with your health care provider. Document Released: 05/28/2005 Document Revised: 01/24/2016 Document Reviewed: 02/19/2016 Elsevier Interactive Patient Education  2017 [employer]  

## 2017-02-03 ENCOUNTER — Telehealth (INDEPENDENT_AMBULATORY_CARE_PROVIDER_SITE_OTHER): Payer: Self-pay

## 2017-02-03 NOTE — Telephone Encounter (Signed)
Patient called stating that she is having problems with her knee and was wondering if wearing her compression hose would cause more problems. She has not seen an Orthopedic doctor as of yet she states this will be her next step. I did speak with the P.A regarding this and she was in agreement as well that in general wearing her compression hose will not cause pain in her knee but that she could elevate above heart level as well.

## 2017-03-11 ENCOUNTER — Telehealth (INDEPENDENT_AMBULATORY_CARE_PROVIDER_SITE_OTHER): Payer: Self-pay

## 2017-03-11 ENCOUNTER — Telehealth: Payer: Self-pay

## 2017-03-11 ENCOUNTER — Ambulatory Visit (INDEPENDENT_AMBULATORY_CARE_PROVIDER_SITE_OTHER): Payer: Medicare PPO | Admitting: Family Medicine

## 2017-03-11 ENCOUNTER — Encounter: Payer: Self-pay | Admitting: Family Medicine

## 2017-03-11 ENCOUNTER — Ambulatory Visit (INDEPENDENT_AMBULATORY_CARE_PROVIDER_SITE_OTHER)
Admission: RE | Admit: 2017-03-11 | Discharge: 2017-03-11 | Disposition: A | Discharge status: Home / Self Care | Payer: Medicare PPO | Source: Ambulatory Visit | Attending: Family Medicine | Admitting: Family Medicine

## 2017-03-11 DIAGNOSIS — M542 Cervicalgia: Secondary | ICD-10-CM | POA: Insufficient documentation

## 2017-03-11 DIAGNOSIS — S40812A Abrasion of left upper arm, initial encounter: Secondary | ICD-10-CM

## 2017-03-11 DIAGNOSIS — T07XXXA Unspecified multiple injuries, initial encounter: Secondary | ICD-10-CM | POA: Diagnosis not present

## 2017-03-11 MED ORDER — CYCLOBENZAPRINE HCL 10 MG PO TABS: 5.0000 mg | ORAL_TABLET | Freq: Three times a day (TID) | ORAL | 1 refills | Status: DC | PRN

## 2017-03-11 NOTE — Patient Instructions (Addendum)
Keep your wound clean gently with soap and water (do not submerge until healed) Do not wear a support stocking on your left leg until bruising and pain improves  Elevate injured areas and use cold compresses whenever you can (including the breast)  Continue tylenol if helpful  Use flexeril for muscle spasm and pain - caution of sedation and falls  You can shower in a couple of days if you feel up to it   Xray of neck today -we will call with result   Heat may feel good on neck and back if it is sore   If the neck pain and headache do not improve in several days alert [place] - or if you get worse   Dress wound with non stick dressing/bactroban ointment  and a loose wrap once daily and clean with soap and water  If increased pain or redness please alert [place]   Follow up in 1-2 weeks for a re check

## 2017-03-11 NOTE — Telephone Encounter (Signed)
Do not try to put on your compression stockings if it is too difficult or painful.   Please put her in with first available for dressing change and eval if possible

## 2017-03-11 NOTE — Telephone Encounter (Signed)
Dr. Marliss Coots 6pm pt cancelled for today so pt will see Dr. Glori Bickers today @ 6pm
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
## 2017-03-12 DIAGNOSIS — S40812A Abrasion of left upper arm, initial encounter: Secondary | ICD-10-CM | POA: Insufficient documentation
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
## 2017-03-12 NOTE — Assessment & Plan Note (Signed)
L leg- several large hematomas that are tender inst not to use supp stocking on this side until swelling and pain have subsided  R breast- 3 large hematomas /tender Heat/ice and support  Will have lumps for up to 12 mo after this I predict R arm- 3 -4 cm oval ecchymosis-resolving (recommend ice)

## 2017-03-13 ENCOUNTER — Ambulatory Visit: Payer: Medicare PPO | Admitting: Family Medicine

## 2017-03-16 ENCOUNTER — Telehealth: Payer: Self-pay | Admitting: Family Medicine

## 2017-03-16 ENCOUNTER — Telehealth: Payer: Self-pay

## 2017-03-16 DIAGNOSIS — M79601 Pain in right arm: Secondary | ICD-10-CM | POA: Insufficient documentation

## 2017-03-16 NOTE — Telephone Encounter (Signed)
Ref done Will route to pcc  

## 2017-03-16 NOTE — Telephone Encounter (Signed)
error 

## 2017-03-16 NOTE — Telephone Encounter (Signed)
Pt notified of Dr. Marliss Coots comments. Pt agrees with referral to Ortho, pt said she has see Dr. Sherley Bounds and Dr. Jalene Mullet both with [facility] so she would like to be referred back there. Please put referral in and I advise pt our [facility] will call pt to schedule appt
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
## 2017-03-16 NOTE — Telephone Encounter (Signed)
Still waiting on the xray reports  I'm leaning towards ref to ortho for the R arm/shoulder pain - would she be ok with that ?
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
## 2017-03-25 ENCOUNTER — Encounter: Payer: Self-pay | Admitting: Family Medicine

## 2017-03-25 ENCOUNTER — Ambulatory Visit (INDEPENDENT_AMBULATORY_CARE_PROVIDER_SITE_OTHER): Payer: Medicare PPO | Admitting: Family Medicine

## 2017-03-25 DIAGNOSIS — T07XXXA Unspecified multiple injuries, initial encounter: Secondary | ICD-10-CM

## 2017-03-25 DIAGNOSIS — M542 Cervicalgia: Secondary | ICD-10-CM

## 2017-03-25 DIAGNOSIS — S40812A Abrasion of left upper arm, initial encounter: Secondary | ICD-10-CM
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
## 2017-03-25 NOTE — Patient Instructions (Addendum)
You can start taking tramadol just when you need it instead of on a schedule  Either the flexeril or hydroxyzine are ok for bedtime (sleep)  If an area is swollen- use ice instead of heat   Wait on your mammogram until the breast is much better   Follow up with PT as scheduled   Take care of yourself
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
## 2017-03-28 NOTE — Assessment & Plan Note (Signed)
R breast- improved/ put off mammogram until better  L leg - slowly improving/ saw orthopedics  R arm= abrasion is much improved
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
## 2017-03-28 NOTE — Assessment & Plan Note (Signed)
Much improved today  Enc to keep clean

## 2017-04-01 ENCOUNTER — Other Ambulatory Visit: Payer: Self-pay | Admitting: Family Medicine

## 2017-04-01 DIAGNOSIS — Z1231 Encounter for screening mammogram for malignant neoplasm of breast: Secondary | ICD-10-CM

## 2017-04-14 ENCOUNTER — Other Ambulatory Visit: Payer: Self-pay | Admitting: Specialist

## 2017-04-14 DIAGNOSIS — S46011D Strain of muscle(s) and tendon(s) of the rotator cuff of right shoulder, subsequent encounter: Secondary | ICD-10-CM

## 2017-04-18 ENCOUNTER — Ambulatory Visit
Admission: RE | Admit: 2017-04-18 | Discharge: 2017-04-18 | Disposition: A | Discharge status: Home / Self Care | Payer: Medicare PPO | Source: Ambulatory Visit | Attending: Specialist | Admitting: Specialist

## 2017-04-18 DIAGNOSIS — S46011D Strain of muscle(s) and tendon(s) of the rotator cuff of right shoulder, subsequent encounter: Secondary | ICD-10-CM | POA: Diagnosis present

## 2017-04-18 DIAGNOSIS — X58XXXD Exposure to other specified factors, subsequent encounter: Secondary | ICD-10-CM | POA: Insufficient documentation

## 2017-05-25 ENCOUNTER — Ambulatory Visit
Admission: RE | Admit: 2017-05-25 | Discharge: 2017-05-25 | Disposition: A | Discharge status: Home / Self Care | Payer: Medicare PPO | Source: Ambulatory Visit | Attending: Family Medicine | Admitting: Family Medicine

## 2017-05-25 DIAGNOSIS — Z1231 Encounter for screening mammogram for malignant neoplasm of breast: Secondary | ICD-10-CM

## 2017-06-02 ENCOUNTER — Ambulatory Visit (INDEPENDENT_AMBULATORY_CARE_PROVIDER_SITE_OTHER): Payer: Medicare PPO

## 2017-06-02 ENCOUNTER — Encounter (INDEPENDENT_AMBULATORY_CARE_PROVIDER_SITE_OTHER): Payer: Self-pay | Admitting: Vascular Surgery

## 2017-06-02 ENCOUNTER — Ambulatory Visit (INDEPENDENT_AMBULATORY_CARE_PROVIDER_SITE_OTHER): Payer: Medicare PPO | Admitting: Vascular Surgery

## 2017-06-02 VITALS — BP 152/85 | HR 76 | Resp 16 | Ht 63.0 in | Wt 191.0 lb

## 2017-06-02 DIAGNOSIS — M79604 Pain in right leg: Secondary | ICD-10-CM | POA: Diagnosis not present

## 2017-06-02 DIAGNOSIS — M79605 Pain in left leg: Secondary | ICD-10-CM | POA: Diagnosis not present

## 2017-06-02 DIAGNOSIS — I83813 Varicose veins of bilateral lower extremities with pain: Secondary | ICD-10-CM | POA: Diagnosis not present
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
## 2017-06-02 NOTE — Patient Instructions (Signed)
Varicose Vein Surgery, Care After Refer to this sheet in the next few weeks. These instructions provide you with information about caring for yourself after your procedure. Your health care provider may also give you more specific instructions. Your treatment has been planned according to current medical practices, but problems sometimes occur. Call your health care provider if you have any problems or questions after your procedure. What can I expect after the procedure? After your procedure, it is typical to have the following:  Swelling.  Bruising.  Soreness.  Mild skin discoloration.  Slight bleeding at incision sites.  Follow these instructions at home:  Take medicines only as directed by your health care provider.  Wear compression stockings as directed by your health care provider. These stockings help to prevent blood clots and reduce swelling in your legs.  There are many different ways to close and cover an incision, including stitches (sutures), skin glue, and adhesive strips. Follow your health care provider's instructions on: ? Incision care. ? Bandage (dressing) changes and removal. ? Incision closure removal.  Wear loose-fitting clothing.  Get regular daily exercise. Walk or ride a stationary bike daily or as directed by your health care provider.  Ask your health care provider when you can return to work. This may depend on the type of work you do.  Be patient with your recovery. It can take up to 4 weeks to get back to your usual activities. Contact a health care provider if:  You have a fever.  You have drainage, redness, swelling, or pain at an incision site.  You develop a cough. Get help right away if:  You pass out.  You have very bad pain in your leg.  You have leg pain that gets worse when you walk.  You have redness or swelling in your leg that is getting worse.  You have trouble breathing.  You cough up blood. This information is not  intended to replace advice given to you by your health care provider. Make sure you discuss any questions you have with your health care provider. Document Released: 04/21/2014 Document Revised: 01/24/2016 Document Reviewed: 01/25/2014 Elsevier Interactive Patient Education  2018 [employer] Inc[employer]  
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
## 2017-07-03 ENCOUNTER — Telehealth: Payer: Self-pay | Admitting: *Deleted

## 2017-07-03 NOTE — Telephone Encounter (Signed)
Copied from [facility] #[id number]. Topic: General - Other >> Jul 03, 2017  4:36 PM Bea Graff, NT wrote: Reason for CRM: Patient wanted office to know she received her flu shot at [facility] on 06/18/17 >> Jul 03, 2017  4:46 PM Tammi Sou, CMA wrote: Chart updated

## 2017-08-03 ENCOUNTER — Telehealth: Payer: Self-pay | Admitting: Family Medicine

## 2017-08-03 DIAGNOSIS — E559 Vitamin D deficiency, unspecified: Secondary | ICD-10-CM

## 2017-08-03 DIAGNOSIS — Z0000 Encounter for general adult medical examination without abnormal findings: Secondary | ICD-10-CM

## 2017-08-03 DIAGNOSIS — E7800 Pure hypercholesterolemia, unspecified: Secondary | ICD-10-CM

## 2017-08-03 NOTE — Telephone Encounter (Signed)
-----   Message from Eustace Pen, LPN sent at 16/09/958  8:52 AM EST ----- Regarding: Labs 12/4 Lab orders needed. Thank you.  Insurance:  [employer]

## 2017-08-05 ENCOUNTER — Ambulatory Visit: Payer: Medicare PPO

## 2017-08-05 ENCOUNTER — Other Ambulatory Visit (INDEPENDENT_AMBULATORY_CARE_PROVIDER_SITE_OTHER): Payer: Medicare PPO

## 2017-08-05 DIAGNOSIS — E559 Vitamin D deficiency, unspecified: Secondary | ICD-10-CM

## 2017-08-05 DIAGNOSIS — Z0000 Encounter for general adult medical examination without abnormal findings: Secondary | ICD-10-CM | POA: Diagnosis not present

## 2017-08-05 DIAGNOSIS — E7800 Pure hypercholesterolemia, unspecified: Secondary | ICD-10-CM

## 2017-08-05 LAB — COMPREHENSIVE METABOLIC PANEL
ALT: 18 U/L (ref 0–35)
AST: 20 U/L (ref 0–37)
Albumin: 4.6 g/dL (ref 3.5–5.2)
Alkaline Phosphatase: 53 U/L (ref 39–117)
BUN: 13 mg/dL (ref 6–23)
CO2: 27 mEq/L (ref 19–32)
Calcium: 9.4 mg/dL (ref 8.4–10.5)
Chloride: 104 mEq/L (ref 96–112)
Creatinine, Ser: 0.73 mg/dL (ref 0.40–1.20)
GFR: 82.96 mL/min (ref >60.00)
Glucose, Bld: 105 mg/dL — ABNORMAL HIGH (ref 70–99)
Potassium: 3.9 mEq/L (ref 3.5–5.1)
Sodium: 140 mEq/L (ref 135–145)
Total Bilirubin: 0.6 mg/dL (ref 0.2–1.2)
Total Protein: 7.4 g/dL (ref 6.0–8.3)

## 2017-08-05 LAB — CBC WITH DIFFERENTIAL/PLATELET
Basophils Absolute: 0.1 10*3/uL (ref 0.0–0.1)
Basophils Relative: 1 % (ref 0.0–3.0)
Eosinophils Absolute: 0.1 10*3/uL (ref 0.0–0.7)
Eosinophils Relative: 2.7 % (ref 0.0–5.0)
HCT: 43.1 % (ref 36.0–46.0)
Hemoglobin: 14.2 g/dL (ref 12.0–15.0)
Lymphocytes Relative: 34.3 % (ref 12.0–46.0)
Lymphs Abs: 1.6 10*3/uL (ref 0.7–4.0)
MCHC: 33.1 g/dL (ref 30.0–36.0)
MCV: 92.7 fl (ref 78.0–100.0)
Monocytes Absolute: 0.6 10*3/uL (ref 0.1–1.0)
Monocytes Relative: 12.4 % — ABNORMAL HIGH (ref 3.0–12.0)
Neutro Abs: 2.4 10*3/uL (ref 1.4–7.7)
Neutrophils Relative %: 49.6 % (ref 43.0–77.0)
Platelets: 217 10*3/uL (ref 150.0–400.0)
RBC: 4.65 Mil/uL (ref 3.87–5.11)
RDW: 13.5 % (ref 11.5–15.5)
WBC: 4.8 10*3/uL (ref 4.0–10.5)

## 2017-08-05 LAB — LIPID PANEL
Cholesterol: 209 mg/dL — ABNORMAL HIGH (ref 0–200)
HDL: 56.7 mg/dL (ref >39.00)
LDL Cholesterol: 132 mg/dL — ABNORMAL HIGH (ref 0–99)
NonHDL: 152.21
Total CHOL/HDL Ratio: 4
Triglycerides: 101 mg/dL (ref 0.0–149.0)
VLDL: 20.2 mg/dL (ref 0.0–40.0)

## 2017-08-05 LAB — VITAMIN D 25 HYDROXY (VIT D DEFICIENCY, FRACTURES): VITD: 48.5 ng/mL (ref 30.00–100.00)

## 2017-08-05 LAB — TSH: TSH: 1.96 u[IU]/mL (ref 0.35–4.50)

## 2017-08-07 ENCOUNTER — Ambulatory Visit (INDEPENDENT_AMBULATORY_CARE_PROVIDER_SITE_OTHER): Payer: Medicare PPO | Admitting: Family Medicine

## 2017-08-07 ENCOUNTER — Encounter: Payer: Self-pay | Admitting: Family Medicine

## 2017-08-07 VITALS — BP 130/82 | HR 78 | Temp 97.9°F | Ht 62.75 in | Wt 189.2 lb

## 2017-08-07 DIAGNOSIS — M8588 Other specified disorders of bone density and structure, other site: Secondary | ICD-10-CM

## 2017-08-07 DIAGNOSIS — M25562 Pain in left knee: Secondary | ICD-10-CM

## 2017-08-07 DIAGNOSIS — R7309 Other abnormal glucose: Secondary | ICD-10-CM

## 2017-08-07 DIAGNOSIS — E7800 Pure hypercholesterolemia, unspecified: Secondary | ICD-10-CM

## 2017-08-07 DIAGNOSIS — I83813 Varicose veins of bilateral lower extremities with pain: Secondary | ICD-10-CM

## 2017-08-07 DIAGNOSIS — E559 Vitamin D deficiency, unspecified: Secondary | ICD-10-CM | POA: Diagnosis not present

## 2017-08-07 DIAGNOSIS — Z6832 Body mass index (BMI) 32.0-32.9, adult: Secondary | ICD-10-CM

## 2017-08-07 DIAGNOSIS — Z0000 Encounter for general adult medical examination without abnormal findings: Secondary | ICD-10-CM | POA: Diagnosis not present

## 2017-08-07 DIAGNOSIS — E6609 Other obesity due to excess calories: Secondary | ICD-10-CM | POA: Diagnosis not present

## 2017-08-07 NOTE — Patient Instructions (Addendum)
For cholesterol   Avoid red meat/ fried foods/ egg yolks/ fatty breakfast meats/ butter, cheese and high fat dairy/ and shellfish    For blood sugar    Try to get most of your carbohydrates from produce (with the exception of white potatoes)  Eat less bread/pasta/rice/snack foods/cereals/sweets and other items from the middle of the grocery store (processed carbs)   Take a look at the info on cologuard and let us know if you are interested   We will fax your insurance info
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
## 2017-08-07 NOTE — Assessment & Plan Note (Signed)
Vitamin D level is therapeutic with current supplementation Disc importance of this to bone and overall health Level in 40s 
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
## 2017-08-07 NOTE — Assessment & Plan Note (Signed)
Disc goals for lipids and reasons to control them Rev labs with pt Rev low sat fat diet in detail Would pref LDL under 130 Will continue to work on diet
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
## 2017-08-07 NOTE — Assessment & Plan Note (Signed)
Reviewed health habits including diet and exercise and skin cancer prevention Reviewed appropriate screening tests for age  Also reviewed health mt list, fam hx and immunization status , as well as social and family history   See HPI Labs reviewed  Stable vision exam  Given info on cologuard to consider for screening (will let us know) Declines dexa utd imms  Enc work on weight and lifestyle (low glycemic) D level is improved
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
## 2017-08-07 NOTE — Assessment & Plan Note (Signed)
105 fasting Pt has done prediabetes training and plans to get back on track with this and weight loss  Disc getting more of her carbs from non processed sources like produce

## 2017-09-09 ENCOUNTER — Ambulatory Visit: Payer: Medicare PPO | Admitting: Family Medicine

## 2017-09-09 ENCOUNTER — Encounter: Payer: Self-pay | Admitting: Family Medicine

## 2017-09-09 ENCOUNTER — Ambulatory Visit (INDEPENDENT_AMBULATORY_CARE_PROVIDER_SITE_OTHER): Payer: Medicare PPO | Admitting: Family Medicine

## 2017-09-09 VITALS — BP 130/82 | HR 84 | Temp 98.2°F | Wt 193.0 lb

## 2017-09-09 DIAGNOSIS — J0100 Acute maxillary sinusitis, unspecified: Secondary | ICD-10-CM

## 2017-09-09 DIAGNOSIS — J019 Acute sinusitis, unspecified: Secondary | ICD-10-CM | POA: Insufficient documentation

## 2017-09-09 MED ORDER — FLUTICASONE PROPIONATE 50 MCG/ACT NA SUSP: 2.0000 | NASAL | 1 refills | Status: DC | PRN

## 2017-09-09 MED ORDER — AZITHROMYCIN 250 MG PO TABS
ORAL_TABLET | ORAL | 0 refills | Status: DC
Start: 2017-09-09 — End: 2018-02-03
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
## 2017-09-09 NOTE — Patient Instructions (Addendum)
I think you have a sinus infection  Hold the allegra for a few weeks  Get back on flonase  Take the zpak as directed- take it with food  Breathe steam  Drink lots of fluids Nasal saline is fine Warm compresses on sinuses are helpful  Tylenol is ok   For congestion and cough mucinex DM or robitussin DM may help over the counter

## 2017-09-10 NOTE — Assessment & Plan Note (Signed)
S/p uri/flu like illness Cover with zpak - pcn allergic   Disc symptomatic care - see instructions on AVS  Update if not starting to improve in a week or if worsening    Avs:  I think you have a sinus infection  Hold the allegra for a few weeks  Get back on flonase  Take the zpak as directed- take it with food  Breathe steam  Drink lots of fluids Nasal saline is fine Warm compresses on sinuses are helpful  Tylenol is ok   For congestion and cough mucinex DM or robitussin DM may help over the counter

## 2017-10-05 ENCOUNTER — Other Ambulatory Visit: Payer: Self-pay | Admitting: *Deleted

## 2017-10-05 MED ORDER — FLUTICASONE PROPIONATE 50 MCG/ACT NA SUSP: 2.0000 | NASAL | 1 refills | Status: DC | PRN

## 2017-10-05 NOTE — Telephone Encounter (Signed)
Received fax requesting Rx to be changed to a 90 day supply, done 

## 2018-02-03 ENCOUNTER — Encounter: Payer: Self-pay | Admitting: Family Medicine

## 2018-02-03 ENCOUNTER — Ambulatory Visit (INDEPENDENT_AMBULATORY_CARE_PROVIDER_SITE_OTHER): Payer: Medicare PPO | Admitting: Family Medicine

## 2018-02-03 VITALS — BP 120/70 | HR 80 | Temp 98.4°F | Ht 62.75 in | Wt 194.5 lb

## 2018-02-03 DIAGNOSIS — J4521 Mild intermittent asthma with (acute) exacerbation: Secondary | ICD-10-CM

## 2018-02-03 DIAGNOSIS — J209 Acute bronchitis, unspecified: Secondary | ICD-10-CM

## 2018-02-03 MED ORDER — FLUTICASONE PROPIONATE 50 MCG/ACT NA SUSP: 2.0000 | NASAL | 1 refills | Status: DC | PRN

## 2018-02-03 MED ORDER — AZITHROMYCIN 250 MG PO TABS
ORAL_TABLET | ORAL | 0 refills | Status: DC
Start: 2018-02-03 — End: 2018-08-10
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
## 2018-02-05 ENCOUNTER — Telehealth: Payer: Self-pay

## 2018-02-05 MED ORDER — BENZONATATE 200 MG PO CAPS: 200.0000 mg | ORAL_CAPSULE | Freq: Three times a day (TID) | ORAL | 1 refills | Status: DC | PRN

## 2018-02-05 MED ORDER — PREDNISONE 10 MG PO TABS: ORAL_TABLET | ORAL | 0 refills | Status: DC

## 2018-02-05 NOTE — Telephone Encounter (Signed)
Is she using her inhaler? How often?  If wheezing I'm afraid we will need to do prednisone- can try low dose however Let me know if agreeable and what pharmacy and also if she needs inhaler refill

## 2018-02-05 NOTE — Telephone Encounter (Signed)
Patient advised.

## 2018-02-05 NOTE — Telephone Encounter (Signed)
Pt is wheezing and using her rescue inhaler at least twice daily. Pt is ok with low dose prednisone taper. [facility] [address], pt is also taking OTC tussin DM and wanted to know if you think that's enough or can you prescribed something else to help with cough. Pt said she is still coughing all the time even though she is taking the tussin DM

## 2018-02-05 NOTE — Telephone Encounter (Signed)
Pt was seen 02/03/18. I spoke with pt; pt has prod cough but usually swallows phlegm; pt has nasal drainage also. Some wheezing and SOB and hoarseness. The cough is keeping pt awake at night. Pt using the inhaler and the Tussin DM without much relief of symptoms;pt has 2 more days of abx to take; no fever. Pt does not want to take steroids due to causing pt to gain weight and makes her nerves act up unless doctor thinks necessary. Pt is not sure if bronchitis or asthma has kicked in. Pt will take steroid if Dr Glori Bickers thinks necessary.pt did not want to schedule appt since just seen 02/03/18. [facility]. Pt said Dr Glori Bickers knows her the best and wants note sent to Dr Glori Bickers; Dr Lorelei Pont saw pt on 02/03/18.

## 2018-02-05 NOTE — Telephone Encounter (Signed)
Copied from [place] [id number]. Topic: General - Other >> Feb 05, 2018  2:11 PM Yvette Rack wrote: Reason for CRM: pt calling wanting a RX for a cough medicine she has been taking OTC [place] and its not working please send it to the   [id number] [place], [place] -

## 2018-02-05 NOTE — Telephone Encounter (Signed)
I will send in the prednisone  Use inhaler as needed  Continue tussin DM Add tessalon pills (do not bite the pill) for additional cough control   F/u if no improvement

## 2018-05-18 ENCOUNTER — Other Ambulatory Visit: Payer: Self-pay | Admitting: Family Medicine

## 2018-05-18 DIAGNOSIS — Z1231 Encounter for screening mammogram for malignant neoplasm of breast: Secondary | ICD-10-CM

## 2018-06-14 ENCOUNTER — Ambulatory Visit
Admission: RE | Admit: 2018-06-14 | Discharge: 2018-06-14 | Disposition: A | Discharge status: Home / Self Care | Payer: Medicare PPO | Source: Ambulatory Visit | Attending: Family Medicine | Admitting: Family Medicine

## 2018-06-14 DIAGNOSIS — Z1231 Encounter for screening mammogram for malignant neoplasm of breast: Secondary | ICD-10-CM

## 2018-06-16 ENCOUNTER — Other Ambulatory Visit: Payer: Self-pay | Admitting: Family Medicine

## 2018-06-16 DIAGNOSIS — R928 Other abnormal and inconclusive findings on diagnostic imaging of breast: Secondary | ICD-10-CM

## 2018-06-21 ENCOUNTER — Ambulatory Visit
Admission: RE | Admit: 2018-06-21 | Discharge: 2018-06-21 | Disposition: A | Discharge status: Home / Self Care | Payer: Medicare PPO | Source: Ambulatory Visit | Attending: Family Medicine | Admitting: Family Medicine

## 2018-06-21 DIAGNOSIS — R928 Other abnormal and inconclusive findings on diagnostic imaging of breast: Secondary | ICD-10-CM

## 2018-06-28 ENCOUNTER — Telehealth: Payer: Self-pay

## 2018-06-28 ENCOUNTER — Other Ambulatory Visit: Payer: Self-pay | Admitting: Family Medicine

## 2018-06-28 DIAGNOSIS — Z1231 Encounter for screening mammogram for malignant neoplasm of breast: Secondary | ICD-10-CM

## 2018-06-28 NOTE — Telephone Encounter (Signed)
Pt hasn't had the high dose before so she said she will stick with the regular does, pt declined scheduling an appt here she said it's easier to get at the pharmacy so I requested pt have the pharmacy send [place] a fax when she gets it done

## 2018-06-28 NOTE — Telephone Encounter (Signed)
I tend to see more side effects to the high dose shot - so usually give the regular one However if she has had the high dose shot without problems in the past- go with that

## 2018-06-28 NOTE — Telephone Encounter (Signed)
Copied from [place]. Topic: General - Other >> Jun 28, 2018  3:42 PM Judyann Munson wrote: Reason for CRM: Patient is requesting a call back in regards to having a flu shot. She is unsure which dose she should take. Please advise

## 2018-07-12 ENCOUNTER — Encounter: Payer: Self-pay | Admitting: *Deleted

## 2018-08-04 ENCOUNTER — Other Ambulatory Visit: Payer: Self-pay | Admitting: Family Medicine

## 2018-08-09 ENCOUNTER — Telehealth: Payer: Self-pay | Admitting: Family Medicine

## 2018-08-09 DIAGNOSIS — E559 Vitamin D deficiency, unspecified: Secondary | ICD-10-CM

## 2018-08-09 DIAGNOSIS — Z0000 Encounter for general adult medical examination without abnormal findings: Secondary | ICD-10-CM

## 2018-08-09 DIAGNOSIS — R7309 Other abnormal glucose: Secondary | ICD-10-CM

## 2018-08-09 DIAGNOSIS — E7800 Pure hypercholesterolemia, unspecified: Secondary | ICD-10-CM

## 2018-08-09 NOTE — Telephone Encounter (Signed)
-----   Message from Eustace Pen, LPN sent at 37/11/8268  3:46 PM EST ----- Regarding: Labs 12/10 Lab orders needed. Thank you.  Insurance:  [employer]

## 2018-08-10 ENCOUNTER — Ambulatory Visit (INDEPENDENT_AMBULATORY_CARE_PROVIDER_SITE_OTHER): Payer: Medicare PPO

## 2018-08-10 VITALS — BP 132/82 | HR 90 | Temp 98.8°F | Ht 63.5 in | Wt 189.2 lb

## 2018-08-10 DIAGNOSIS — R7309 Other abnormal glucose: Secondary | ICD-10-CM

## 2018-08-10 DIAGNOSIS — Z0000 Encounter for general adult medical examination without abnormal findings: Secondary | ICD-10-CM | POA: Diagnosis not present

## 2018-08-10 DIAGNOSIS — E559 Vitamin D deficiency, unspecified: Secondary | ICD-10-CM | POA: Diagnosis not present

## 2018-08-10 DIAGNOSIS — E7800 Pure hypercholesterolemia, unspecified: Secondary | ICD-10-CM | POA: Diagnosis not present

## 2018-08-10 LAB — CBC WITH DIFFERENTIAL/PLATELET
Basophils Absolute: 0.1 10*3/uL (ref 0.0–0.1)
Basophils Relative: 1.2 % (ref 0.0–3.0)
Eosinophils Absolute: 0.2 10*3/uL (ref 0.0–0.7)
Eosinophils Relative: 3.6 % (ref 0.0–5.0)
HCT: 41.8 % (ref 36.0–46.0)
Hemoglobin: 14 g/dL (ref 12.0–15.0)
Lymphocytes Relative: 34.1 % (ref 12.0–46.0)
Lymphs Abs: 1.5 10*3/uL (ref 0.7–4.0)
MCHC: 33.4 g/dL (ref 30.0–36.0)
MCV: 92.3 fl (ref 78.0–100.0)
Monocytes Absolute: 0.6 10*3/uL (ref 0.1–1.0)
Monocytes Relative: 12.6 % — ABNORMAL HIGH (ref 3.0–12.0)
Neutro Abs: 2.1 10*3/uL (ref 1.4–7.7)
Neutrophils Relative %: 48.5 % (ref 43.0–77.0)
Platelets: 200 10*3/uL (ref 150.0–400.0)
RBC: 4.53 Mil/uL (ref 3.87–5.11)
RDW: 13.4 % (ref 11.5–15.5)
WBC: 4.4 10*3/uL (ref 4.0–10.5)

## 2018-08-10 LAB — VITAMIN D 25 HYDROXY (VIT D DEFICIENCY, FRACTURES): VITD: 34.98 ng/mL (ref 30.00–100.00)

## 2018-08-10 LAB — LIPID PANEL
Cholesterol: 176 mg/dL (ref 0–200)
HDL: 52.8 mg/dL (ref >39.00)
LDL Cholesterol: 97 mg/dL (ref 0–99)
NonHDL: 123.52
Total CHOL/HDL Ratio: 3
Triglycerides: 134 mg/dL (ref 0.0–149.0)
VLDL: 26.8 mg/dL (ref 0.0–40.0)

## 2018-08-10 LAB — HEMOGLOBIN A1C: Hgb A1c MFr Bld: 5.7 % (ref 4.6–6.5)

## 2018-08-10 LAB — COMPREHENSIVE METABOLIC PANEL
ALT: 18 U/L (ref 0–35)
AST: 19 U/L (ref 0–37)
Albumin: 4.3 g/dL (ref 3.5–5.2)
Alkaline Phosphatase: 58 U/L (ref 39–117)
BUN: 11 mg/dL (ref 6–23)
CO2: 30 mEq/L (ref 19–32)
Calcium: 9.7 mg/dL (ref 8.4–10.5)
Chloride: 106 mEq/L (ref 96–112)
Creatinine, Ser: 0.68 mg/dL (ref 0.40–1.20)
GFR: 89.79 mL/min (ref >60.00)
Glucose, Bld: 106 mg/dL — ABNORMAL HIGH (ref 70–99)
Potassium: 4.3 mEq/L (ref 3.5–5.1)
Sodium: 141 mEq/L (ref 135–145)
Total Bilirubin: 0.6 mg/dL (ref 0.2–1.2)
Total Protein: 7.1 g/dL (ref 6.0–8.3)

## 2018-08-10 LAB — TSH: TSH: 1.43 u[IU]/mL (ref 0.35–4.50)

## 2018-08-10 NOTE — Progress Notes (Signed)
Subjective:   Jody Avery is a 72 y.o. female who presents for Medicare Annual (Subsequent) preventive examination.  Review of Systems:  N/A Cardiac Risk Factors include: advanced age (>87men, >29 women);dyslipidemia;obesity (BMI >30kg/m2)     Objective:     Vitals: BP 132/82 (BP Location: Right Arm, Patient Position: Sitting, Cuff Size: Normal)   Pulse 90   Temp 98.8 F (37.1 C) (Oral)   Ht 5' 3.5" (1.613 m) Comment: shoes  Wt 189 lb 4 oz (85.8 kg)   SpO2 96%   BMI 33.00 kg/m   Body mass index is 33 kg/m.  Advanced Directives 08/10/2018 06/02/2017 01/20/2017 07/31/2016  Does Patient Have a Medical Advance Directive? Yes Yes Yes Yes  Type of Paramedic of [place];Living will [place];Living will [place];Living will [place];Living will  Copy of [place] in Chart? No - copy requested - - No - copy requested    Tobacco Social History   Tobacco Use  Smoking Status Never Smoker  Smokeless Tobacco Never Used     Counseling given: No   Clinical Intake:  Pre-visit preparation completed: Yes  Pain : 0-10 Pain Score: 3  Pain Type: Chronic pain Pain Location: Generalized     Nutritional Status: BMI > 30  Obese Nutritional Risks: None Diabetes: No  How often do you need to have someone help you when you read instructions, pamphlets, or other written materials from your doctor or pharmacy?: 1 - Never What is the last grade level you completed in school?: Bachelor degree  Interpreter Needed?: No  Comments: pt lives with spouse Information entered by :: LPinson, LPN  Past Medical History:  Diagnosis Date  . Allergy    allergic rhinitis  . Asthma   . DDD (degenerative disc disease)    low back pain deg disc dz with infections  . Hearing loss    mild  . Hyperlipidemia   . Osteopenia   . Osteoporosis   . Tinnitus    Past Surgical History:    Procedure Laterality Date  . ABDOMINAL HYSTERECTOMY  1989   total, fibroids  . KNEE CARTILAGE SURGERY    . torn meniscus  2007   left    Family History  Problem Relation Age of Onset  . Osteoporosis Mother   . Cancer Mother        ? liver cancer ? primary  . Heart disease Father        MI  . Osteoporosis Sister   . Diabetes Sister   . Heart disease Sister        CHF  . Heart disease Brother        CAD  . Heart disease Brother        CAD  . Osteoporosis Sister   . Diabetes Sister   . Breast cancer Neg Hx    Social History   Socioeconomic History  . Marital status: Married    Spouse name: Not on file  . Number of children: Not on file  . Years of education: Not on file  . Highest education level: Not on file  Occupational History  . Not on file  Social Needs  . Financial resource strain: Not on file  . Food insecurity:    Worry: Not on file    Inability: Not on file  . Transportation needs:    Medical: Not on file    Non-medical: Not on file  Tobacco Use  . Smoking status: Never Smoker  . Smokeless tobacco: Never Used  Substance and Sexual Activity  . Alcohol use: No    Alcohol/week: 0.0 standard drinks  . Drug use: No  . Sexual activity: Never  Lifestyle  . Physical activity:    Days per week: Not on file    Minutes per session: Not on file  . Stress: Not on file  Relationships  . Social connections:    Talks on phone: Not on file    Gets together: Not on file    Attends religious service: Not on file    Active member of club or organization: Not on file    Attends meetings of clubs or organizations: Not on file    Relationship status: Not on file  Other Topics Concern  . Not on file  Social History Narrative  . Not on file    Outpatient Encounter Medications as of 08/10/2018  Medication Sig  . albuterol (PROVENTIL HFA;VENTOLIN HFA) 108 (90 BASE) MCG/ACT inhaler Inhale into the lungs as needed.   . fluticasone (FLONASE) 50 MCG/ACT nasal spray  PLACE 2 SPRAYS INTO BOTH NOSTRILS AS NEEDED.  [name] Fluticasone-Salmeterol (ADVAIR DISKUS) 100-50 MCG/DOSE AEPB Inhale 1 puff into the lungs every 12 (twelve) hours.    . [DISCONTINUED] acetaminophen (TYLENOL) 500 MG tablet Take 500 mg by mouth every 6 (six) hours as needed.  . [DISCONTINUED] azithromycin (ZITHROMAX) 250 MG tablet 2 tabs po on day 1, then 1 tab po for 4 days  . [DISCONTINUED] benzonatate (TESSALON) 200 MG capsule Take 1 capsule (200 mg total) by mouth 3 (three) times daily as needed for cough. Do not bite pill  . [DISCONTINUED] Dextromethorphan-guaiFENesin (TUSSIN DM) 10-100 MG/5ML liquid Take 5 mLs by mouth every 12 (twelve) hours.  . [DISCONTINUED] fexofenadine (ALLEGRA ODT) 30 MG disintegrating tablet Take 60 mg by mouth daily as needed.    . [DISCONTINUED] Glucos-Chond-Hyal Ac-Ca Fructo (MOVE FREE JOINT HEALTH ADVANCE PO) Take 1 tablet by mouth daily.  . [DISCONTINUED] Multiple Vitamins-Minerals (HAIR/SKIN/NAILS PO) Take 2 tablets by mouth daily.   . [DISCONTINUED] predniSONE (DELTASONE) 10 MG tablet Take 2 pills by mouth once daily for 5 days then 1 pill daily for 3 days then stop   No facility-administered encounter medications on file as of 08/10/2018.     Activities of Daily Living In your present state of health, do you have any difficulty performing the following activities: 08/10/2018  Hearing? Y  Vision? N  Difficulty concentrating or making decisions? N  Walking or climbing stairs? N  Dressing or bathing? N  Doing errands, shopping? N  Preparing Food and eating ? N  Using the Toilet? N  In the past six months, have you accidently leaked urine? N  Do you have problems with loss of bowel control? N  Managing your Medications? N  Managing your Finances? N  Housekeeping or managing your Housekeeping? N  Some recent data might be hidden    Patient Care Team: Tower, Wynelle Fanny, MD as PCP - General    Assessment:   This is a routine wellness examination for  [place].  Hearing Screening Comments: Audiology exam in 2019 @ Dr. McQueen/[facility] ; candidate for hearing aids Vision Screening Comments: Vision exam in 2019 with Dr. Matilde Sprang  Exercise Activities and Dietary recommendations Current Exercise Habits: Home exercise routine, Type of exercise: Other - see comments(water aerobics), Time (Minutes): 60, Frequency (Times/Week): 3, Weekly Exercise (Minutes/Week): 180, Intensity: Moderate, Exercise limited by: None  identified  Goals    . Patient Stated     When tolerated, I will resume water aerobics for 60-90 minutes 3 days per week.        Fall Risk Fall Risk  08/10/2018 08/07/2017 07/31/2016 07/31/2015 07/05/2014  Falls in the past year? 0 No No Yes Yes  Number falls in past yr: - - - 1 1  Injury with Fall? - - - No Yes   Depression Screen PHQ 2/9 Scores 08/10/2018 08/07/2017 07/31/2016 07/31/2015  PHQ - 2 Score 0 0 0 0  PHQ- 9 Score 0 - - -     Cognitive Function MMSE - Mini Mental State Exam 08/10/2018 07/31/2016  Orientation to time 5 5  Orientation to Place 5 5  Registration 3 3  Attention/ Calculation 0 0  Recall 3 2  Language- name 2 objects 0 0  Language- repeat 1 1  Language- follow 3 step command 3 3  Language- read & follow direction 0 0  Write a sentence 0 0  Copy design 0 0  Total score 20 19       PLEASE NOTE: A Mini-Cog screen was completed. Maximum score is 20. A value of 0 denotes this part of [place] MMSE was not completed or the patient failed this part of the Mini-Cog screening.   Mini-Cog Screening Orientation to Time - Max 5 pts Orientation to Place - Max 5 pts Registration - Max 3 pts Recall - Max 3 pts Language Repeat - Max 1 pts Language Follow 3 Step Command - Max 3 pts   Immunization History  Administered Date(s) Administered  . Influenza Whole 06/01/2008, 07/02/2010  . Influenza, Seasonal, Injecte, Preservative Fre 07/11/2014, 06/30/2015  . Influenza,inj,Quad PF,6+ Mos 06/13/2017,  07/05/2018  . Influenza-Unspecified 07/02/2013, 06/18/2017  . Pneumococcal Conjugate-13 07/05/2014  . Pneumococcal Polysaccharide-23 07/26/2008, 07/31/2016  . Td 04/29/1998, 04/30/2007, 11/06/2010, 03/10/2017  . Zoster 08/30/2007    Screening Tests Health Maintenance  Topic Date Due  . MAMMOGRAM  06/15/2019  . COLONOSCOPY  03/24/2022  . TETANUS/TDAP  03/11/2027  . INFLUENZA VACCINE  Completed  . DEXA SCAN  Completed  . Hepatitis C Screening  Completed  . PNA vac Low Risk Adult  Completed      Plan:     I have personally reviewed, addressed, and noted the following in the patient's chart:  A. Medical and social history B. Use of alcohol, tobacco or illicit drugs  C. Current medications and supplements D. Functional ability and status E.  Nutritional status F.  Physical activity G. Advance directives H. List of other physicians I.  Hospitalizations, surgeries, and ER visits in previous 12 months J.  [place] to include hearing, vision, cognitive, depression L. Referrals and appointments - none  In addition, I have reviewed and discussed with patient certain preventive protocols, quality metrics, and best practice recommendations. A written personalized care plan for preventive services as well as general preventive health recommendations were provided to patient.  See attached scanned questionnaire for additional information.   Signed,   Lindell Noe, MHA, BS, LPN Health Coach

## 2018-08-10 NOTE — Patient Instructions (Signed)
Jody Avery , Thank you for taking time to come for your Medicare Wellness Visit. I appreciate your ongoing commitment to your health goals. Please review the following plan we discussed and let me know if I can assist you in the future.   These are the goals we discussed: Goals    . Patient Stated     When tolerated, I will resume water aerobics for 60-90 minutes 3 days per week.        This is a list of the screening recommended for you and due dates:  Health Maintenance  Topic Date Due  . Mammogram  06/15/2019  . Colon Cancer Screening  03/24/2022  . Tetanus Vaccine  03/11/2027  . Flu Shot  Completed  . DEXA scan (bone density measurement)  Completed  .  Hepatitis C: One time screening is recommended by Center for Disease Control  (CDC) for  adults born from [birth year] through 1967.   Completed  . Pneumonia vaccines  Completed   Preventive Care for Adults  A healthy lifestyle and preventive care can promote health and wellness. Preventive health guidelines for adults include the following key practices.  . A routine yearly physical is a good way to check with your health care provider about your health and preventive screening. It is a chance to share any concerns and updates on your health and to receive a thorough exam.  . Visit your dentist for a routine exam and preventive care every 6 months. Brush your teeth twice a day and floss once a day. Good oral hygiene prevents tooth decay and gum disease.  . The frequency of eye exams is based on your age, health, family medical history, use  of contact lenses, and other factors. Follow your health care provider's recommendations for frequency of eye exams.  . Eat a healthy diet. Foods like vegetables, fruits, whole grains, low-fat dairy products, and lean protein foods contain the nutrients you need without too many calories. Decrease your intake of foods high in solid fats, added sugars, and salt. Eat the right amount of calories for you.  Get information about a proper diet from your health care provider, if necessary.  . Regular physical exercise is one of the most important things you can do for your health. Most adults should get at least 150 minutes of moderate-intensity exercise (any activity that increases your heart rate and causes you to sweat) each week. In addition, most adults need muscle-strengthening exercises on 2 or more days a week.  Silver Sneakers may be a benefit available to you. To determine eligibility, you may visit the website: www.silversneakers.com or contact program at 936-490-7993 Mon-Fri between 8AM-8PM.   . Maintain a healthy weight. The body mass index (BMI) is a screening tool to identify possible weight problems. It provides an estimate of body fat based on height and weight. Your health care provider can find your BMI and can help you achieve or maintain a healthy weight.   For adults 20 years and older: ? A BMI below 18.5 is considered underweight. ? A BMI of 18.5 to 24.9 is normal. ? A BMI of 25 to 29.9 is considered overweight. ? A BMI of 30 and above is considered obese.   . Maintain normal blood lipids and cholesterol levels by exercising and minimizing your intake of saturated fat. Eat a balanced diet with plenty of fruit and vegetables. Blood tests for lipids and cholesterol should begin at age 71 and be repeated every 5 years. If  your lipid or cholesterol levels are high, you are over 50, or you are at high risk for heart disease, you may need your cholesterol levels checked more frequently. Ongoing high lipid and cholesterol levels should be treated with medicines if diet and exercise are not working.  . If you smoke, find out from your health care provider how to quit. If you do not use tobacco, please do not start.  . If you choose to drink alcohol, please do not consume more than 2 drinks per day. One drink is considered to be 12 ounces (355 mL) of beer, 5 ounces (148 mL) of wine, or  1.5 ounces (44 mL) of liquor.  . If you are 67-28 years old, ask your health care provider if you should take aspirin to prevent strokes.  . Use sunscreen. Apply sunscreen liberally and repeatedly throughout the day. You should seek shade when your shadow is shorter than you. Protect yourself by wearing long sleeves, pants, a wide-brimmed hat, and sunglasses year round, whenever you are outdoors.  . Once a month, do a whole body skin exam, using a mirror to look at the skin on your back. Tell your health care provider of new moles, moles that have irregular borders, moles that are larger than a pencil eraser, or moles that have changed in shape or color.

## 2018-08-10 NOTE — Progress Notes (Signed)
PCP notes:   Health maintenance:  No gaps identified.  Abnormal screenings:   None  Patient concerns:   None  Nurse concerns:  None  Next PCP appt:   08/17/18 @ 1130  I reviewed health advisor's note, was available for consultation, and agree with documentation and plan. Loura Pardon MD

## 2018-08-17 ENCOUNTER — Encounter: Payer: Medicare PPO | Admitting: Family Medicine

## 2018-08-19 ENCOUNTER — Encounter: Payer: Self-pay | Admitting: Family Medicine

## 2018-08-19 ENCOUNTER — Ambulatory Visit (INDEPENDENT_AMBULATORY_CARE_PROVIDER_SITE_OTHER): Payer: Medicare PPO | Admitting: Family Medicine

## 2018-08-19 VITALS — BP 130/78 | HR 80 | Temp 97.8°F | Ht 64.0 in | Wt 190.0 lb

## 2018-08-19 DIAGNOSIS — M8588 Other specified disorders of bone density and structure, other site: Secondary | ICD-10-CM

## 2018-08-19 DIAGNOSIS — E7800 Pure hypercholesterolemia, unspecified: Secondary | ICD-10-CM

## 2018-08-19 DIAGNOSIS — E559 Vitamin D deficiency, unspecified: Secondary | ICD-10-CM

## 2018-08-19 DIAGNOSIS — R7309 Other abnormal glucose: Secondary | ICD-10-CM

## 2018-08-19 DIAGNOSIS — Z0000 Encounter for general adult medical examination without abnormal findings: Secondary | ICD-10-CM

## 2018-08-19 DIAGNOSIS — E6609 Other obesity due to excess calories: Secondary | ICD-10-CM

## 2018-08-19 DIAGNOSIS — Z6832 Body mass index (BMI) 32.0-32.9, adult: Secondary | ICD-10-CM
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
## 2018-08-19 NOTE — Assessment & Plan Note (Signed)
Lab Results  Component Value Date   HGBA1C 5.7 08/10/2018  when in a program -pt lost 20 lb (but gained it back due to bad habits)  Enc her to start tracking food again (low glycemic diet) Also exercise as tolerated

## 2018-08-19 NOTE — Assessment & Plan Note (Signed)
Reviewed health habits including diet and exercise and skin cancer prevention Reviewed appropriate screening tests for age  Also reviewed health mt list, fam hx and immunization status , as well as social and family history    Rev amw  Disc strategy to slowly start exercise Recommend getting back to the low glycemic diet for wt loss  For colon cancer screening-cologuard ordered (if her abd pain re occurs urged her to f/u)  She plans to f/u with ENT re: chronic ear problsms

## 2018-08-19 NOTE — Assessment & Plan Note (Signed)
Discussed how this problem influences overall health and the risks it imposes  Reviewed plan for weight loss with lower calorie diet (via better food choices and also portion control or program like weight watchers) and exercise building up to or more than 30 minutes 5 days per week including some aerobic activity    

## 2018-08-19 NOTE — Assessment & Plan Note (Signed)
Intol of bisphosphonate and evista Declines prolia  dexa 2014 and declines another one  Disc imp of vit D Since ca constipates-she gets that from dairy products  No falls or fractures

## 2018-08-19 NOTE — Assessment & Plan Note (Signed)
Disc goals for lipids and reasons to control them Rev last labs with pt Rev low sat fat diet in detail  LDL improved to 97- reassuring  HDL is in 50s

## 2018-08-19 NOTE — Patient Instructions (Addendum)
Start with some low impact exercise and advance very slowly  (like water) or start with a 5 minute walk daily  Fitness is most important to stay independent   Get back to tracking food for weight loss   Cholesterol improved Avoid red meat/ fried foods/ egg yolks/ fatty breakfast meats/ butter, cheese and high fat dairy/ and shellfish    Follow up with Dr Tami Ribas regarding your dizziness and hearing   We will set you up with the cologuard test for colon cancer screening

## 2018-08-19 NOTE — Assessment & Plan Note (Signed)
Level in 64s Enc her to continue 4000 iu daily  Disc imp to bone and overall health

## 2018-09-02 ENCOUNTER — Telehealth: Payer: Self-pay | Admitting: Family Medicine

## 2018-09-02 NOTE — Telephone Encounter (Signed)
Pt will go forward with Cologuard test with [facility]  New insurance- changed 09/01/18 [employer]/medicare adv Grp #[id number] Member # [id number]

## 2018-09-03 NOTE — Telephone Encounter (Signed)
Please sign her up  Thanks

## 2018-09-06 NOTE — Telephone Encounter (Signed)
Form faxed to [facility]/Cologuard dpt

## 2018-09-13 ENCOUNTER — Encounter: Payer: Self-pay | Admitting: Family Medicine

## 2018-09-13 DIAGNOSIS — Z1211 Encounter for screening for malignant neoplasm of colon: Secondary | ICD-10-CM | POA: Diagnosis not present

## 2018-09-13 DIAGNOSIS — Z1212 Encounter for screening for malignant neoplasm of rectum: Secondary | ICD-10-CM | POA: Diagnosis not present

## 2018-09-13 LAB — COLOGUARD

## 2018-09-24 ENCOUNTER — Encounter: Payer: Self-pay | Admitting: *Deleted

## 2018-09-28 DIAGNOSIS — L570 Actinic keratosis: Secondary | ICD-10-CM | POA: Diagnosis not present

## 2018-09-28 DIAGNOSIS — Z85828 Personal history of other malignant neoplasm of skin: Secondary | ICD-10-CM | POA: Diagnosis not present

## 2018-09-28 DIAGNOSIS — L739 Follicular disorder, unspecified: Secondary | ICD-10-CM | POA: Diagnosis not present

## 2018-09-28 DIAGNOSIS — I8393 Asymptomatic varicose veins of bilateral lower extremities: Secondary | ICD-10-CM | POA: Diagnosis not present

## 2018-09-28 DIAGNOSIS — Z1283 Encounter for screening for malignant neoplasm of skin: Secondary | ICD-10-CM | POA: Diagnosis not present

## 2018-11-11 DIAGNOSIS — J4530 Mild persistent asthma, uncomplicated: Secondary | ICD-10-CM | POA: Diagnosis not present

## 2018-11-11 DIAGNOSIS — J3089 Other allergic rhinitis: Secondary | ICD-10-CM | POA: Diagnosis not present

## 2019-02-09 ENCOUNTER — Telehealth: Payer: Self-pay | Admitting: *Deleted

## 2019-02-09 NOTE — Telephone Encounter (Signed)
If she feels very sick it is worrisome - I recommend urgent care   Otherwise we could do urine drop off and virtual visit tomorrow please

## 2019-02-09 NOTE — Telephone Encounter (Signed)
Patient called stating that she started Friday with urinary symptoms. Patient stated that she started driniking cranberry juice, but now is having a low grade fever 99.6 and her temperature usually runs around 97.0. Patient stated that her urine is dark, having discomfort, burning, body aches and chills. Patient request advice.

## 2019-02-09 NOTE — Telephone Encounter (Signed)
Pt doesn't want to go to UC due to Covid-19, pt schedule an doxy with Dr. Glori Bickers tomorrow and she will drop off a urine sample early in the morning. I advised pt is sxs worsen or if she develops any new sxs to go to UC or ER over night. Pt verbalized understanding

## 2019-02-10 ENCOUNTER — Encounter: Payer: Self-pay | Admitting: Family Medicine

## 2019-02-10 ENCOUNTER — Ambulatory Visit (INDEPENDENT_AMBULATORY_CARE_PROVIDER_SITE_OTHER): Payer: Medicare Other | Admitting: Family Medicine

## 2019-02-10 DIAGNOSIS — R300 Dysuria: Secondary | ICD-10-CM

## 2019-02-10 DIAGNOSIS — R5081 Fever presenting with conditions classified elsewhere: Secondary | ICD-10-CM | POA: Diagnosis not present

## 2019-02-10 DIAGNOSIS — R509 Fever, unspecified: Secondary | ICD-10-CM | POA: Insufficient documentation

## 2019-02-10 LAB — POC URINALSYSI DIPSTICK (AUTOMATED)
Bilirubin, UA: NEGATIVE
Blood, UA: NEGATIVE
Glucose, UA: NEGATIVE
Ketones, UA: NEGATIVE
Leukocytes, UA: NEGATIVE
Nitrite, UA: NEGATIVE
Protein, UA: NEGATIVE
Spec Grav, UA: 1.015 (ref 1.010–1.025)
Urobilinogen, UA: 0.2 E.U./dL
pH, UA: 6 (ref 5.0–8.0)

## 2019-02-10 MED ORDER — NITROFURANTOIN MONOHYD MACRO 100 MG PO CAPS: 100.0000 mg | ORAL_CAPSULE | Freq: Two times a day (BID) | ORAL | 0 refills | Status: DC

## 2019-02-10 NOTE — Patient Instructions (Signed)
Continue to drink lots of water  Watch temperature closely - alert [place] if it goes up further Tylenol is ok for symptoms Watch for cough or respiratory symptoms/ GI symptoms Take macrobid for possible uti as directed  When urine culture returns we will call you  Continue to isolate for now
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
## 2019-02-10 NOTE — Assessment & Plan Note (Signed)
Pt has classic uti symptoms despite neg UA  Will cx it Cover empirically with macrobid (she has many med allergies)  Disc importance of monitoring temp with covid/pandemic situation and isolate herself just in case (she already is) If new symptoms inst to alert [place] asap  Also if worse dysuria or bladder pain  Pending cx result -will update

## 2019-02-10 NOTE — Assessment & Plan Note (Signed)
Low grade Suspect due to her uti   No covid contacts and has been isolating -so unlikely  Will watch closely  Urine cx pending-px macrobid Update if not starting to improve in a week or if worsening   inst to continue to isolate-she voiced understanding

## 2019-02-11 LAB — URINE CULTURE
MICRO NUMBER:: 560272
SPECIMEN QUALITY:: ADEQUATE

## 2019-02-15 DIAGNOSIS — H43813 Vitreous degeneration, bilateral: Secondary | ICD-10-CM | POA: Diagnosis not present

## 2019-02-15 DIAGNOSIS — H2513 Age-related nuclear cataract, bilateral: Secondary | ICD-10-CM | POA: Diagnosis not present

## 2019-04-07 DIAGNOSIS — H43813 Vitreous degeneration, bilateral: Secondary | ICD-10-CM | POA: Diagnosis not present

## 2019-04-07 DIAGNOSIS — H2513 Age-related nuclear cataract, bilateral: Secondary | ICD-10-CM | POA: Diagnosis not present

## 2019-04-07 DIAGNOSIS — H00015 Hordeolum externum left lower eyelid: Secondary | ICD-10-CM | POA: Diagnosis not present

## 2019-05-16 DIAGNOSIS — H25813 Combined forms of age-related cataract, bilateral: Secondary | ICD-10-CM | POA: Diagnosis not present

## 2019-05-16 DIAGNOSIS — H43813 Vitreous degeneration, bilateral: Secondary | ICD-10-CM | POA: Diagnosis not present

## 2019-05-16 DIAGNOSIS — H5203 Hypermetropia, bilateral: Secondary | ICD-10-CM | POA: Diagnosis not present

## 2019-05-31 DIAGNOSIS — H25812 Combined forms of age-related cataract, left eye: Secondary | ICD-10-CM | POA: Diagnosis not present

## 2019-05-31 DIAGNOSIS — H268 Other specified cataract: Secondary | ICD-10-CM | POA: Diagnosis not present

## 2019-05-31 DIAGNOSIS — H21562 Pupillary abnormality, left eye: Secondary | ICD-10-CM | POA: Diagnosis not present

## 2019-06-10 ENCOUNTER — Ambulatory Visit: Payer: Medicare Other

## 2019-06-14 DIAGNOSIS — H268 Other specified cataract: Secondary | ICD-10-CM | POA: Diagnosis not present

## 2019-06-14 DIAGNOSIS — H25811 Combined forms of age-related cataract, right eye: Secondary | ICD-10-CM | POA: Diagnosis not present

## 2019-06-17 ENCOUNTER — Ambulatory Visit: Payer: Medicare PPO

## 2019-06-17 ENCOUNTER — Other Ambulatory Visit: Payer: Self-pay | Admitting: Family Medicine

## 2019-06-17 ENCOUNTER — Other Ambulatory Visit: Payer: Self-pay

## 2019-06-17 ENCOUNTER — Ambulatory Visit
Admission: RE | Admit: 2019-06-17 | Discharge: 2019-06-17 | Disposition: A | Discharge status: Home / Self Care | Payer: Medicare Other | Source: Ambulatory Visit | Attending: Family Medicine | Admitting: Family Medicine

## 2019-06-17 DIAGNOSIS — Z1231 Encounter for screening mammogram for malignant neoplasm of breast: Secondary | ICD-10-CM

## 2019-06-17 DIAGNOSIS — N63 Unspecified lump in unspecified breast: Secondary | ICD-10-CM

## 2019-06-23 ENCOUNTER — Ambulatory Visit
Admission: RE | Admit: 2019-06-23 | Discharge: 2019-06-23 | Disposition: A | Discharge status: Home / Self Care | Payer: Medicare Other | Source: Ambulatory Visit | Attending: Family Medicine | Admitting: Family Medicine

## 2019-06-23 ENCOUNTER — Other Ambulatory Visit: Payer: Self-pay

## 2019-06-23 DIAGNOSIS — N63 Unspecified lump in unspecified breast: Secondary | ICD-10-CM

## 2019-06-23 DIAGNOSIS — N6001 Solitary cyst of right breast: Secondary | ICD-10-CM | POA: Diagnosis not present

## 2019-06-23 DIAGNOSIS — N6310 Unspecified lump in the right breast, unspecified quadrant: Secondary | ICD-10-CM | POA: Diagnosis not present

## 2019-06-23 DIAGNOSIS — R928 Other abnormal and inconclusive findings on diagnostic imaging of breast: Secondary | ICD-10-CM | POA: Diagnosis not present

## 2019-08-10 DIAGNOSIS — Z961 Presence of intraocular lens: Secondary | ICD-10-CM | POA: Diagnosis not present

## 2019-08-14 ENCOUNTER — Telehealth: Payer: Self-pay | Admitting: Family Medicine

## 2019-08-14 DIAGNOSIS — E7800 Pure hypercholesterolemia, unspecified: Secondary | ICD-10-CM

## 2019-08-14 DIAGNOSIS — Z0000 Encounter for general adult medical examination without abnormal findings: Secondary | ICD-10-CM

## 2019-08-14 DIAGNOSIS — R7309 Other abnormal glucose: Secondary | ICD-10-CM

## 2019-08-14 DIAGNOSIS — E559 Vitamin D deficiency, unspecified: Secondary | ICD-10-CM

## 2019-08-14 NOTE — Telephone Encounter (Signed)
-----   Message from Ellamae Sia sent at 08/09/2019  2:31 PM EST ----- Regarding: lab orders for Monday, 12.14.20 Patient is scheduled for CPX labs, please order future labs, Thanks , Karna Christmas

## 2019-08-14 NOTE — Telephone Encounter (Signed)
-----   Message from Ellamae Sia sent at 08/08/2019 11:49 AM EST ----- Regarding: lab orders for Monday, 12.14.20 Patient is scheduled for CPX labs, please order future labs, Thanks , Karna Christmas

## 2019-08-15 ENCOUNTER — Other Ambulatory Visit: Payer: Self-pay

## 2019-08-15 ENCOUNTER — Other Ambulatory Visit: Payer: Medicare PPO

## 2019-08-15 ENCOUNTER — Other Ambulatory Visit (INDEPENDENT_AMBULATORY_CARE_PROVIDER_SITE_OTHER): Payer: Medicare Other

## 2019-08-15 DIAGNOSIS — R7309 Other abnormal glucose: Secondary | ICD-10-CM | POA: Diagnosis not present

## 2019-08-15 DIAGNOSIS — E7800 Pure hypercholesterolemia, unspecified: Secondary | ICD-10-CM

## 2019-08-15 DIAGNOSIS — E559 Vitamin D deficiency, unspecified: Secondary | ICD-10-CM | POA: Diagnosis not present

## 2019-08-15 DIAGNOSIS — Z0000 Encounter for general adult medical examination without abnormal findings: Secondary | ICD-10-CM | POA: Diagnosis not present

## 2019-08-15 LAB — CBC WITH DIFFERENTIAL/PLATELET
Basophils Absolute: 0.1 10*3/uL (ref 0.0–0.1)
Basophils Relative: 1.1 % (ref 0.0–3.0)
Eosinophils Absolute: 0.2 10*3/uL (ref 0.0–0.7)
Eosinophils Relative: 4.9 % (ref 0.0–5.0)
HCT: 41.4 % (ref 36.0–46.0)
Hemoglobin: 13.6 g/dL (ref 12.0–15.0)
Lymphocytes Relative: 37.7 % (ref 12.0–46.0)
Lymphs Abs: 1.8 10*3/uL (ref 0.7–4.0)
MCHC: 32.9 g/dL (ref 30.0–36.0)
MCV: 91.8 fl (ref 78.0–100.0)
Monocytes Absolute: 0.6 10*3/uL (ref 0.1–1.0)
Monocytes Relative: 12 % (ref 3.0–12.0)
Neutro Abs: 2.1 10*3/uL (ref 1.4–7.7)
Neutrophils Relative %: 44.3 % (ref 43.0–77.0)
Platelets: 198 10*3/uL (ref 150.0–400.0)
RBC: 4.51 Mil/uL (ref 3.87–5.11)
RDW: 13.6 % (ref 11.5–15.5)
WBC: 4.8 10*3/uL (ref 4.0–10.5)

## 2019-08-15 LAB — COMPREHENSIVE METABOLIC PANEL
ALT: 17 U/L (ref 0–35)
AST: 18 U/L (ref 0–37)
Albumin: 4.2 g/dL (ref 3.5–5.2)
Alkaline Phosphatase: 60 U/L (ref 39–117)
BUN: 9 mg/dL (ref 6–23)
CO2: 27 mEq/L (ref 19–32)
Calcium: 9.4 mg/dL (ref 8.4–10.5)
Chloride: 106 mEq/L (ref 96–112)
Creatinine, Ser: 0.72 mg/dL (ref 0.40–1.20)
GFR: 78.87 mL/min (ref >60.00)
Glucose, Bld: 98 mg/dL (ref 70–99)
Potassium: 4.1 mEq/L (ref 3.5–5.1)
Sodium: 141 mEq/L (ref 135–145)
Total Bilirubin: 0.6 mg/dL (ref 0.2–1.2)
Total Protein: 7 g/dL (ref 6.0–8.3)

## 2019-08-15 LAB — LIPID PANEL
Cholesterol: 194 mg/dL (ref 0–200)
HDL: 51.4 mg/dL (ref >39.00)
LDL Cholesterol: 116 mg/dL — ABNORMAL HIGH (ref 0–99)
NonHDL: 143.03
Total CHOL/HDL Ratio: 4
Triglycerides: 137 mg/dL (ref 0.0–149.0)
VLDL: 27.4 mg/dL (ref 0.0–40.0)

## 2019-08-15 LAB — TSH: TSH: 2.4 u[IU]/mL (ref 0.35–4.50)

## 2019-08-15 LAB — HEMOGLOBIN A1C: Hgb A1c MFr Bld: 5.8 % (ref 4.6–6.5)

## 2019-08-15 LAB — VITAMIN D 25 HYDROXY (VIT D DEFICIENCY, FRACTURES): VITD: 45.02 ng/mL (ref 30.00–100.00)

## 2019-08-18 ENCOUNTER — Other Ambulatory Visit: Payer: Self-pay

## 2019-08-18 ENCOUNTER — Ambulatory Visit (INDEPENDENT_AMBULATORY_CARE_PROVIDER_SITE_OTHER): Payer: Medicare Other

## 2019-08-18 DIAGNOSIS — Z0000 Encounter for general adult medical examination without abnormal findings: Secondary | ICD-10-CM | POA: Diagnosis not present

## 2019-08-18 NOTE — Progress Notes (Signed)
PCP notes:  Health Maintenance: Needs flu vaccine    Abnormal Screenings: none   Patient concerns: none   Nurse concerns: none   Next PCP appt.: 08/22/2019 @ 11:30 am

## 2019-08-18 NOTE — Patient Instructions (Addendum)
Ms. Jody Avery , Thank you for taking time to come for your Medicare Wellness Visit. I appreciate your ongoing commitment to your health goals. Please review the following plan we discussed and let me know if I can assist you in the future.   Screening recommendations/referrals: Colonoscopy: Up to date, completed 03/24/2012 Mammogram: Up to date, completed 06/23/2019 Bone Density: Up to date, completed 12/06/2012 Recommended yearly ophthalmology/optometry visit for glaucoma screening and checkup Recommended yearly dental visit for hygiene and checkup  Vaccinations: Influenza vaccine: will get at later date Pneumococcal vaccine: Completed series Tdap vaccine: Up to date, completed 03/10/2017 Shingles vaccine: discussed    Advanced directives: Please bring a copy of your POA (Power of [place]) and/or Living Will to your next appointment.   Conditions/risks identified: hyperlipidemia  Next appointment: 08/22/2019 @ 11:30 am    Preventive Care 65 Years and Older, Female Preventive care refers to lifestyle choices and visits with your health care provider that can promote health and wellness. What does preventive care include?  A yearly physical exam. This is also called an annual well check.  Dental exams once or twice a year.  Routine eye exams. Ask your health care provider how often you should have your eyes checked.  Personal lifestyle choices, including:  Daily care of your teeth and gums.  Regular physical activity.  Eating a healthy diet.  Avoiding tobacco and drug use.  Limiting alcohol use.  Practicing safe sex.  Taking low-dose aspirin every day.  Taking vitamin and mineral supplements as recommended by your health care provider. What happens during an annual well check? The services and screenings done by your health care provider during your annual well check will depend on your age, overall health, lifestyle risk factors, and family history of disease. Counseling    Your health care provider may ask you questions about your:  Alcohol use.  Tobacco use.  Drug use.  Emotional well-being.  Home and relationship well-being.  Sexual activity.  Eating habits.  History of falls.  Memory and ability to understand (cognition).  Work and work Statistician.  Reproductive health. Screening  You may have the following tests or measurements:  Height, weight, and BMI.  Blood pressure.  Lipid and cholesterol levels. These may be checked every 5 years, or more frequently if you are over 65 years old.  Skin check.  Lung cancer screening. You may have this screening every year starting at age 64 if you have a 30-pack-year history of smoking and currently smoke or have quit within the past 15 years.  Fecal occult blood test (FOBT) of the stool. You may have this test every year starting at age 24.  Flexible sigmoidoscopy or colonoscopy. You may have a sigmoidoscopy every 5 years or a colonoscopy every 10 years starting at age 33.  Hepatitis C blood test.  Hepatitis B blood test.  Sexually transmitted disease (STD) testing.  Diabetes screening. This is done by checking your blood sugar (glucose) after you have not eaten for a while (fasting). You may have this done every 1-3 years.  Bone density scan. This is done to screen for osteoporosis. You may have this done starting at age 76.  Mammogram. This may be done every 1-2 years. Talk to your health care provider about how often you should have regular mammograms. Talk with your health care provider about your test results, treatment options, and if necessary, the need for more tests. Vaccines  Your health care provider may recommend certain vaccines, such as:  Influenza vaccine. This is recommended every year.  Tetanus, diphtheria, and acellular pertussis (Tdap, Td) vaccine. You may need a Td booster every 10 years.  Zoster vaccine. You may need this after age 55.  Pneumococcal 13-valent  conjugate (PCV13) vaccine. One dose is recommended after age 73.  Pneumococcal polysaccharide (PPSV23) vaccine. One dose is recommended after age 11. Talk to your health care provider about which screenings and vaccines you need and how often you need them. This information is not intended to replace advice given to you by your health care provider. Make sure you discuss any questions you have with your health care provider. Document Released: 09/14/2015 Document Revised: 05/07/2016 Document Reviewed: 06/19/2015 Elsevier Interactive Patient Education  2017 [place] Prevention in the Home Falls can cause injuries. They can happen to people of all ages. There are many things you can do to make your home safe and to help prevent falls. What can I do on the outside of my home?  Regularly fix the edges of walkways and driveways and fix any cracks.  Remove anything that might make you trip as you walk through a door, such as a raised step or threshold.  Trim any bushes or trees on the path to your home.  Use bright outdoor lighting.  Clear any walking paths of anything that might make someone trip, such as rocks or tools.  Regularly check to see if handrails are loose or broken. Make sure that both sides of any steps have handrails.  Any raised decks and porches should have guardrails on the edges.  Have any leaves, snow, or ice cleared regularly.  Use sand or salt on walking paths during winter.  Clean up any spills in your garage right away. This includes oil or grease spills. What can I do in the bathroom?  Use night lights.  Install grab bars by the toilet and in the tub and shower. Do not use towel bars as grab bars.  Use non-skid mats or decals in the tub or shower.  If you need to sit down in the shower, use a plastic, non-slip stool.  Keep the floor dry. Clean up any water that spills on the floor as soon as it happens.  Remove soap buildup in the tub or  shower regularly.  Attach bath mats securely with double-sided non-slip rug tape.  Do not have throw rugs and other things on the floor that can make you trip. What can I do in the bedroom?  Use night lights.  Make sure that you have a light by your bed that is easy to reach.  Do not use any sheets or blankets that are too big for your bed. They should not hang down onto the floor.  Have a firm chair that has side arms. You can use this for support while you get dressed.  Do not have throw rugs and other things on the floor that can make you trip. What can I do in the kitchen?  Clean up any spills right away.  Avoid walking on wet floors.  Keep items that you use a lot in easy-to-reach places.  If you need to reach something above you, use a strong step stool that has a grab bar.  Keep electrical cords out of the way.  Do not use floor polish or wax that makes floors slippery. If you must use wax, use non-skid floor wax.  Do not have throw rugs and other things on the floor that  can make you trip. What can I do with my stairs?  Do not leave any items on the stairs.  Make sure that there are handrails on both sides of the stairs and use them. Fix handrails that are broken or loose. Make sure that handrails are as long as the stairways.  Check any carpeting to make sure that it is firmly attached to the stairs. Fix any carpet that is loose or worn.  Avoid having throw rugs at the top or bottom of the stairs. If you do have throw rugs, attach them to the floor with carpet tape.  Make sure that you have a light switch at the top of the stairs and the bottom of the stairs. If you do not have them, ask someone to add them for you. What else can I do to help prevent falls?  Wear shoes that:  Do not have high heels.  Have rubber bottoms.  Are comfortable and fit you well.  Are closed at the toe. Do not wear sandals.  If you use a stepladder:  Make sure that it is fully  opened. Do not climb a closed stepladder.  Make sure that both sides of the stepladder are locked into place.  Ask someone to hold it for you, if possible.  Clearly mark and make sure that you can see:  Any grab bars or handrails.  First and last steps.  Where the edge of each step is.  Use tools that help you move around (mobility aids) if they are needed. These include:  Canes.  Walkers.  Scooters.  Crutches.  Turn on the lights when you go into a dark area. Replace any light bulbs as soon as they burn out.  Set up your furniture so you have a clear path. Avoid moving your furniture around.  If any of your floors are uneven, fix them.  If there are any pets around you, be aware of where they are.  Review your medicines with your doctor. Some medicines can make you feel dizzy. This can increase your chance of falling. Ask your doctor what other things that you can do to help prevent falls. This information is not intended to replace advice given to you by your health care provider. Make sure you discuss any questions you have with your health care provider. Document Released: 06/14/2009 Document Revised: 01/24/2016 Document Reviewed: 09/22/2014 Elsevier Interactive Patient Education  2017 [employer].

## 2019-08-18 NOTE — Progress Notes (Signed)
Subjective:   Pretty Dileo is a 73 y.o. female who presents for Medicare Annual (Subsequent) preventive examination.  Review of Systems: N/A   This visit is being conducted through telemedicine via telephone at the nurse health advisor's home address due to the COVID-19 pandemic. This patient has given me verbal consent via doximity to conduct this visit, patient states they are participating from their home address. Patient and myself are on the telephone call. There is no referral for this visit. Some vital signs may be absent or patient reported.    Patient identification: identified by name, DOB, and current address   Cardiac Risk Factors include: advanced age (>3men, >34 women);dyslipidemia     Objective:     Vitals: There were no vitals taken for this visit.  There is no height or weight on file to calculate BMI.  Advanced Directives 08/18/2019 08/10/2018 06/02/2017 01/20/2017 07/31/2016  Does Patient Have a Medical Advance Directive? Yes Yes Yes Yes Yes  Type of Paramedic of [place];Living will [place];Living will [place];Living will [place];Living will [place];Living will  Copy of [place] in Chart? No - copy requested No - copy requested - - No - copy requested    Tobacco Social History   Tobacco Use  Smoking Status Never Smoker  Smokeless Tobacco Never Used     Counseling given: Not Answered   Clinical Intake:  Pre-visit preparation completed: Yes  Pain : No/denies pain     Nutritional Risks: None Diabetes: No  How often do you need to have someone help you when you read instructions, pamphlets, or other written materials from your doctor or pharmacy?: 1 - Never What is the last grade level you completed in school?: BS degree  Interpreter Needed?: No  Information entered by :: CJohnson, LPN  Past Medical History:    Diagnosis Date  . Allergy    allergic rhinitis  . Asthma   . DDD (degenerative disc disease)    low back pain deg disc dz with infections  . Hearing loss    mild  . Hyperlipidemia   . Osteopenia   . Osteoporosis   . Tinnitus    Past Surgical History:  Procedure Laterality Date  . ABDOMINAL HYSTERECTOMY  1989   total, fibroids  . EYE SURGERY    . KNEE CARTILAGE SURGERY    . torn meniscus  2007   left    Family History  Problem Relation Age of Onset  . Osteoporosis Mother   . Cancer Mother        ? liver cancer ? primary  . Heart disease Father        MI  . Osteoporosis Sister   . Diabetes Sister   . Heart disease Sister        CHF  . Heart disease Brother        CAD  . Heart disease Brother        CAD  . Osteoporosis Sister   . Diabetes Sister   . Breast cancer Neg Hx    Social History   Socioeconomic History  . Marital status: Married    Spouse name: Not on file  . Number of children: Not on file  . Years of education: Not on file  . Highest education level: Not on file  Occupational History  . Not on file  Tobacco Use  . Smoking status: Never Smoker  .  Smokeless tobacco: Never Used  Substance and Sexual Activity  . Alcohol use: No    Alcohol/week: 0.0 standard drinks  . Drug use: No  . Sexual activity: Never  Other Topics Concern  . Not on file  Social History Narrative  . Not on file   Social Determinants of Health   Financial Resource Strain: Low Risk   . Difficulty of Paying Living Expenses: Not hard at all  Food Insecurity: No Food Insecurity  . Worried About Charity fundraiser in the Last Year: Never true  . Ran Out of Food in the Last Year: Never true  Transportation Needs: No Transportation Needs  . Lack of Transportation (Medical): No  . Lack of Transportation (Non-Medical): No  Physical Activity: Inactive  . Days of Exercise per Week: 0 days  . Minutes of Exercise per Session: 0 min  Stress: No Stress Concern Present  .  Feeling of Stress : Not at all  Social Connections:   . Frequency of Communication with Friends and Family: Not on file  . Frequency of Social Gatherings with Friends and Family: Not on file  . Attends Religious Services: Not on file  . Active Member of Clubs or Organizations: Not on file  . Attends Archivist Meetings: Not on file  . Marital Status: Not on file    Outpatient Encounter Medications as of 08/18/2019  Medication Sig  . albuterol (PROVENTIL HFA;VENTOLIN HFA) 108 (90 BASE) MCG/ACT inhaler Inhale into the lungs as needed.   . cholecalciferol (VITAMIN D) 1000 units tablet Take 4,000 Units by mouth daily.  [name] EPIPEN 2-PAK 0.3 MG/0.3ML SOAJ injection INJECT AS DIRECTED AS NEEDED FOR SYSTEMIC REACTION  . fluticasone (FLONASE) 50 MCG/ACT nasal spray PLACE 2 SPRAYS INTO BOTH NOSTRILS AS NEEDED.  [name] Fluticasone-Salmeterol (ADVAIR DISKUS) 100-50 MCG/DOSE AEPB Inhale 1 puff into the lungs every 12 (twelve) hours.    . nitrofurantoin, macrocrystal-monohydrate, (MACROBID) 100 MG capsule Take 1 capsule (100 mg total) by mouth 2 (two) times daily.   No facility-administered encounter medications on file as of 08/18/2019.    Activities of Daily Living In your present state of health, do you have any difficulty performing the following activities: 08/18/2019  Hearing? Y  Comment some hearing loss noted, needs hearing aids  Vision? N  Difficulty concentrating or making decisions? N  Walking or climbing stairs? N  Dressing or bathing? N  Doing errands, shopping? N  Preparing Food and eating ? N  Using the Toilet? N  In the past six months, have you accidently leaked urine? N  Do you have problems with loss of bowel control? N  Managing your Medications? N  Managing your Finances? N  Housekeeping or managing your Housekeeping? N  Some recent data might be hidden    Patient Care Team: Tower, Wynelle Fanny, MD as PCP - General    Assessment:   This is a routine wellness  examination for [place].  Exercise Activities and Dietary recommendations Current Exercise Habits: The patient does not participate in regular exercise at present, Exercise limited by: None identified  Goals    . Patient Stated     When tolerated, I will resume water aerobics for 60-90 minutes 3 days per week.     . Patient Stated     08/18/2019, I will try to lose some weight and exercise more daily.        Fall Risk Fall Risk  08/18/2019 08/10/2018 08/07/2017 07/31/2016 07/31/2015  Falls in the past  year? 0 0 No No Yes  Number falls in past yr: 0 - - - 1  Injury with Fall? 0 - - - No  Follow up Falls evaluation completed;Falls prevention discussed - - - -   Is the patient's home free of loose throw rugs in walkways, pet beds, electrical cords, etc?   yes      Grab bars in the bathroom? no      Handrails on the stairs?   no      Adequate lighting?   yes  Timed Get Up and Go performed: N/A  Depression Screen PHQ 2/9 Scores 08/18/2019 08/10/2018 08/07/2017 07/31/2016  PHQ - 2 Score 0 0 0 0  PHQ- 9 Score 0 0 - -     Cognitive Function MMSE - Mini Mental State Exam 08/18/2019 08/10/2018 07/31/2016  Orientation to time 5 5 5   Orientation to Place 5 5 5   Registration 3 3 3   Attention/ Calculation 5 0 0  Recall 3 3 2   Language- name 2 objects - 0 0  Language- repeat 1 1 1   Language- follow 3 step command - 3 3  Language- read & follow direction - 0 0  Write a sentence - 0 0  Copy design - 0 0  Total score - 20 19  Mini Cog  Mini-Cog screen was completed. Maximum score is 22. A value of 0 denotes this part of the MMSE was not completed or the patient failed this part of the Mini-Cog screening.       Immunization History  Administered Date(s) Administered  . Influenza Inj Mdck Quad Pf 07/05/2018  . Influenza Whole 06/01/2008, 07/02/2010  . Influenza, Seasonal, Injecte, Preservative Fre 07/11/2014, 06/30/2015  . Influenza,inj,Quad PF,6+ Mos 06/13/2017  .  Influenza-Unspecified 07/02/2013, 06/18/2017  . Pneumococcal Conjugate-13 07/05/2014  . Pneumococcal Polysaccharide-23 07/26/2008, 07/31/2016  . Td 04/29/1998, 04/30/2007, 11/06/2010, 03/10/2017  . Zoster 08/30/2007    Qualifies for Shingles Vaccine? Yes  Screening Tests Health Maintenance  Topic Date Due  . INFLUENZA VACCINE  04/02/2019  . MAMMOGRAM  06/22/2020  . COLONOSCOPY  03/24/2022  . TETANUS/TDAP  03/11/2027  . DEXA SCAN  Completed  . Hepatitis C Screening  Completed  . PNA vac Low Risk Adult  Completed    Cancer Screenings: Lung: Low Dose CT Chest recommended if Age 70-80 years, 30 pack-year currently smoking OR have quit w/in 15years. Patient does not qualify. Breast:  Up to date on Mammogram? Yes, completed 06/23/2019   Up to date of Bone Density/Dexa? Yes, completed 12/06/2012 Colorectal: completed 03/24/2012  Additional Screenings:  Hepatitis C Screening: 07/31/2016     Plan:   Patient will work on losing weight and exercise more daily.    I have personally reviewed and noted the following in the patient's chart:   . Medical and social history . Use of alcohol, tobacco or illicit drugs  . Current medications and supplements . Functional ability and status . Nutritional status . Physical activity . Advanced directives . List of other physicians . Hospitalizations, surgeries, and ER visits in previous 12 months . Vitals . Screenings to include cognitive, depression, and falls . Referrals and appointments  In addition, I have reviewed and discussed with patient certain preventive protocols, quality metrics, and best practice recommendations. A written personalized care plan for preventive services as well as general preventive health recommendations were provided to patient.     Andrez Grime, LPN  [phone]

## 2019-08-22 ENCOUNTER — Encounter: Payer: Self-pay | Admitting: Family Medicine

## 2019-08-22 ENCOUNTER — Ambulatory Visit (INDEPENDENT_AMBULATORY_CARE_PROVIDER_SITE_OTHER): Payer: Medicare Other | Admitting: Family Medicine

## 2019-08-22 ENCOUNTER — Ambulatory Visit: Payer: Medicare PPO

## 2019-08-22 VITALS — BP 119/73 | HR 73 | Temp 98.2°F | Wt 180.0 lb

## 2019-08-22 DIAGNOSIS — M810 Age-related osteoporosis without current pathological fracture: Secondary | ICD-10-CM

## 2019-08-22 DIAGNOSIS — E7800 Pure hypercholesterolemia, unspecified: Secondary | ICD-10-CM

## 2019-08-22 DIAGNOSIS — R7309 Other abnormal glucose: Secondary | ICD-10-CM

## 2019-08-22 DIAGNOSIS — E6609 Other obesity due to excess calories: Secondary | ICD-10-CM

## 2019-08-22 DIAGNOSIS — R5081 Fever presenting with conditions classified elsewhere: Secondary | ICD-10-CM

## 2019-08-22 DIAGNOSIS — Z6832 Body mass index (BMI) 32.0-32.9, adult: Secondary | ICD-10-CM

## 2019-08-22 DIAGNOSIS — E559 Vitamin D deficiency, unspecified: Secondary | ICD-10-CM | POA: Diagnosis not present

## 2019-08-22 DIAGNOSIS — E2839 Other primary ovarian failure: Secondary | ICD-10-CM | POA: Insufficient documentation

## 2019-08-22 DIAGNOSIS — Z0000 Encounter for general adult medical examination without abnormal findings: Secondary | ICD-10-CM | POA: Diagnosis not present

## 2019-08-22 MED ORDER — FLUTICASONE PROPIONATE 50 MCG/ACT NA SUSP: 2.0000 | NASAL | 3 refills | Status: DC | PRN

## 2019-08-22 NOTE — Assessment & Plan Note (Signed)
LDL is up slightly at 116 Disc goals for lipids and reasons to control them Rev last labs with pt Rev low sat fat diet in detail Made plan to cut back on bacon/cheese and fried foods

## 2019-08-22 NOTE — Assessment & Plan Note (Signed)
Lab Results  Component Value Date   HGBA1C 5.8 08/15/2019   disc imp of low glycemic diet and wt loss to prevent DM2

## 2019-08-22 NOTE — Assessment & Plan Note (Signed)
Discussed how this problem influences overall health and the risks it imposes  Reviewed plan for weight loss with lower calorie diet (via better food choices and also portion control or program like weight watchers) and exercise building up to or more than 30 minutes 5 days per week including some aerobic activity   Commended on wt loss so far  
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
## 2019-08-22 NOTE — Assessment & Plan Note (Signed)
Intolerant of oral bisphosphenate and also evista  Declines prolia after reading about potential side effects Open to re discussion if her BMD is worse dexa ordered-inst pt to schedule it at the breast center  D level is therapeutic Enc pt to exercise  No falls or fx

## 2019-08-22 NOTE — Patient Instructions (Addendum)
For cholesterol  Avoid red meat/ fried foods/ egg yolks/ fatty breakfast meats/ butter, cheese and high fat dairy/ and shellfish    Call us in about 2 weeks to set up a nurse visit for flu shot -out from the car   If you are interested in the new shingles vaccine (Shingrix) - call your local pharmacy to check on coverage and availability  If affordable, get on a wait list at your pharmacy to get the vaccine.  Call the breast center to set up the bone density test when you are ready to get it

## 2019-08-22 NOTE — Assessment & Plan Note (Signed)
Vitamin D level is therapeutic with current supplementation Disc importance of this to bone and overall health Level of 45

## 2019-08-22 NOTE — Assessment & Plan Note (Signed)
Reviewed health habits including diet and exercise and skin cancer prevention Reviewed appropriate screening tests for age  Also reviewed health mt list, fam hx and immunization status , as well as social and family history   See HPI Labs reviewed amw reviewed  inst to call and schedule a nurse visit for a flu shot once she is out of quarantine  Commended re: wt loss  Discussed shingrix vaccine and she plans to check on coverage cologuard neg 1/20  Due for dexa-this was ordered

## 2019-10-10 DIAGNOSIS — L570 Actinic keratosis: Secondary | ICD-10-CM | POA: Diagnosis not present

## 2019-10-10 DIAGNOSIS — L738 Other specified follicular disorders: Secondary | ICD-10-CM | POA: Diagnosis not present

## 2019-10-10 DIAGNOSIS — L821 Other seborrheic keratosis: Secondary | ICD-10-CM | POA: Diagnosis not present

## 2019-10-10 DIAGNOSIS — Z86007 Personal history of in-situ neoplasm of skin: Secondary | ICD-10-CM | POA: Diagnosis not present

## 2019-10-10 DIAGNOSIS — Z1283 Encounter for screening for malignant neoplasm of skin: Secondary | ICD-10-CM | POA: Diagnosis not present

## 2019-10-31 ENCOUNTER — Telehealth: Payer: Self-pay | Admitting: *Deleted

## 2019-10-31 NOTE — Telephone Encounter (Signed)
Pt notified of Dr. Tower's comments and verbalized understanding  

## 2019-10-31 NOTE — Telephone Encounter (Signed)
Patient left a voicemail wanting to know if Dr. Glori Bickers will review her allergies and let her know if she should take the covid vaccine?  Patient stated that she has a lot of allergies. Patient wants to know if she should go ahead and take vaccine or wait a while?

## 2019-10-31 NOTE — Telephone Encounter (Signed)
I do not see any evidence of anaphylaxis with another vaccine so she should be fine.  I would go ahead and get it. If there is something not listed to the above, please let me know

## 2019-11-10 DIAGNOSIS — J4530 Mild persistent asthma, uncomplicated: Secondary | ICD-10-CM | POA: Diagnosis not present

## 2019-11-10 DIAGNOSIS — J3089 Other allergic rhinitis: Secondary | ICD-10-CM | POA: Diagnosis not present

## 2019-11-17 ENCOUNTER — Telehealth: Payer: Self-pay | Admitting: Family Medicine

## 2019-11-17 NOTE — Telephone Encounter (Signed)
Pt stated on her 08/15/2019 labs [facility] medicare denied because of Service is not covered for either the primary dx or service code listed for  assy thyroid stim hormone Complete cbc and auto diff wbc Metabolic panel compressive  She wanted to know what could be done so insurance will pay for bcbs  Please advise

## 2019-11-18 NOTE — Telephone Encounter (Signed)
Spoke with patient regarding her billing concern. Patient understood.   "TSH (CPT [phone] $49) was denied by [facility] Medicare as non covered diagnosis and also denied by [facility] as non covered.  [employer] Medicare follows Medicare NCD policy for lab work and will not cover lab tests with Z00.00.  It was billed with Z00.00 which is the dx that was on lab order.  There is no other reason for test documented, so I am not able to request the dx be changed."

## 2019-11-18 NOTE — Telephone Encounter (Signed)
In the past medicare advantage plan did over Z00.0 diagnoses when pt did not have individual diagnoses to cover each test.   This must have recently changed   Usually and ABN will be triggered and I have pt sign it --unsure why it did not this time Thanks

## 2019-11-18 NOTE — Telephone Encounter (Signed)
Email sent to billing requesting review.

## 2019-11-18 NOTE — Telephone Encounter (Signed)
Dr. Glori Bickers,  I told the patient that I would forward the information to you so that you were aware of her concern. I did explain to her the reason for the denial.

## 2019-12-07 ENCOUNTER — Ambulatory Visit
Admission: RE | Admit: 2019-12-07 | Discharge: 2019-12-07 | Disposition: A | Discharge status: Home / Self Care | Payer: Medicare Other | Source: Ambulatory Visit | Attending: Family Medicine | Admitting: Family Medicine

## 2019-12-07 ENCOUNTER — Other Ambulatory Visit: Payer: Self-pay

## 2019-12-07 DIAGNOSIS — Z780 Asymptomatic menopausal state: Secondary | ICD-10-CM | POA: Diagnosis not present

## 2019-12-07 DIAGNOSIS — E2839 Other primary ovarian failure: Secondary | ICD-10-CM

## 2019-12-07 DIAGNOSIS — M85852 Other specified disorders of bone density and structure, left thigh: Secondary | ICD-10-CM | POA: Diagnosis not present

## 2019-12-07 DIAGNOSIS — M810 Age-related osteoporosis without current pathological fracture: Secondary | ICD-10-CM | POA: Diagnosis not present

## 2019-12-12 ENCOUNTER — Telehealth: Payer: Self-pay

## 2019-12-12 NOTE — Telephone Encounter (Signed)
Pt left v/m requesting cb about 12/07/2019 bone density results.

## 2019-12-13 ENCOUNTER — Other Ambulatory Visit: Payer: Self-pay

## 2019-12-13 ENCOUNTER — Ambulatory Visit (INDEPENDENT_AMBULATORY_CARE_PROVIDER_SITE_OTHER): Payer: Medicare Other | Admitting: Family Medicine

## 2019-12-13 ENCOUNTER — Encounter: Payer: Self-pay | Admitting: Family Medicine

## 2019-12-13 VITALS — BP 136/84 | HR 82 | Temp 97.8°F | Ht 64.0 in | Wt 189.6 lb

## 2019-12-13 DIAGNOSIS — M5442 Lumbago with sciatica, left side: Secondary | ICD-10-CM

## 2019-12-13 DIAGNOSIS — E66811 Other obesity due to excess calories: Secondary | ICD-10-CM

## 2019-12-13 DIAGNOSIS — E6609 Other obesity due to excess calories: Secondary | ICD-10-CM | POA: Diagnosis not present

## 2019-12-13 DIAGNOSIS — M545 Low back pain, unspecified: Secondary | ICD-10-CM | POA: Insufficient documentation

## 2019-12-13 DIAGNOSIS — M51379 Other intervertebral disc degeneration, lumbosacral region without mention of lumbar back pain or lower extremity pain: Secondary | ICD-10-CM

## 2019-12-13 DIAGNOSIS — M810 Age-related osteoporosis without current pathological fracture: Secondary | ICD-10-CM | POA: Diagnosis not present

## 2019-12-13 DIAGNOSIS — M5137 Other intervertebral disc degeneration, lumbosacral region: Secondary | ICD-10-CM

## 2019-12-13 DIAGNOSIS — Z6832 Body mass index (BMI) 32.0-32.9, adult: Secondary | ICD-10-CM

## 2019-12-13 DIAGNOSIS — K6289 Other specified diseases of anus and rectum: Secondary | ICD-10-CM

## 2019-12-13 DIAGNOSIS — G8929 Other chronic pain: Secondary | ICD-10-CM

## 2019-12-13 MED ORDER — HYDROCORTISONE (PERIANAL) 2.5 % EX CREA: TOPICAL_CREAM | CUTANEOUS | 0 refills | Status: DC
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
## 2019-12-13 NOTE — Telephone Encounter (Signed)
I called pt back and she has some questions about her DEXA results. Pt did view them on mychart and she saw the comment regarding her worsening osteoporosis. Pt said that Dr. Glori Bickers mentioned Prolia as a option. Pt said she has tried 3 different "bone medicine" and they all caused her worsening bone pain. Pt read about Prolia and the main side effect is bone pain. Pt said due to that she doesn't want to try Prolia, because pt said she already has a lot of bone pain. Pt also questioned if she should keep taking 4000 iu of vitamin D. Pt said she told us that she stopped taking her calcium due to constipation but she checked and her hair/skin/nail vitamins had some calcium in it and she also use to take glucosamine and that had some calcium in it. Pt asked if there is anything else she can take for her osteoporosis given the fact she has already tried 3 different meds an Prolia is out of the question.   Pt then stated that her osteoporosis is causing severe back pain and leg pain. I advised pt that osteoporosis doesn't cause pain. She advise me that she has been having bad back pain and leg pain for about 3 weeks and doesn't know why and wanted me to tell her what I think. I did advise pt she should see her PCP and discuss her DEXA questions directly with her as well as have Dr. Glori Bickers check her back pain/ leg pain out.  appt scheduled today at 3:45pm to discuss DEXA and eval leg/back pain  FYI to PCP

## 2019-12-13 NOTE — Assessment & Plan Note (Signed)
Intol of meds tried so far (bisphosphonate and evista) Too apprehensive to try prolia  Good D intake/tx level  No falls or fractures  Enc wt bearing exercise  Ref made to endocrinology to discuss further

## 2019-12-13 NOTE — Assessment & Plan Note (Signed)
L lumbar radiating to her pelvis and leg  Suspect due to her disc dz (has had surgery in the past) Ref for PT  If no improvement will consider MRI/ f/u with neuro surg or orthopedics

## 2019-12-13 NOTE — Assessment & Plan Note (Signed)
Wt loss may help some of her back issues  Encouraged walking as tolerated and healthy diet

## 2019-12-13 NOTE — Assessment & Plan Note (Addendum)
Pt has some discomfort worse when straining and trouble with cleaning after bm Suspect mild external hemorrhoids  Px anusol hc to use externally prn  If no improvement inst to call  Also to keep stools soft and continue fiber and fluids Reviewed last colonoscopy from 2013

## 2019-12-13 NOTE — Patient Instructions (Addendum)
Let's go forward with an endocrinology referral to discuss treatment options for osteoporosis  Our office will call you about that   Try the anusol hc cream for rectal discomfort   Physical therapy may be a good idea for your back  I placed a referral and our office will call you about that   Stay as active as you can be

## 2019-12-13 NOTE — Telephone Encounter (Signed)
Thanks, I will see her then  

## 2019-12-13 NOTE — Assessment & Plan Note (Signed)
This may be the cause of ongoing L sided lumbar pain with radiation to pelvic area and leg  Ref made to PT  If she fails consv tx -may need MRI and f/u with neurosurgery

## 2019-12-16 ENCOUNTER — Other Ambulatory Visit: Payer: Self-pay

## 2019-12-16 ENCOUNTER — Ambulatory Visit (INDEPENDENT_AMBULATORY_CARE_PROVIDER_SITE_OTHER): Payer: Medicare Other | Admitting: Dermatology

## 2019-12-16 DIAGNOSIS — L570 Actinic keratosis: Secondary | ICD-10-CM | POA: Diagnosis not present

## 2019-12-16 DIAGNOSIS — L578 Other skin changes due to chronic exposure to nonionizing radiation: Secondary | ICD-10-CM

## 2019-12-16 NOTE — Patient Instructions (Signed)
Cryotherapy Aftercare  . Wash gently with soap and water everyday.   . Apply Vaseline and Band-Aid daily until healed.  

## 2019-12-16 NOTE — Progress Notes (Signed)
   Follow-Up Visit   Subjective  Jody Avery is a 74 y.o. female who presents for the following: recheck AK (L upper lip - patient still feels a rough, flaky, patch, and the area previously treated is darker than the rest of her lip. ).  Has been frozen twice.  The following portions of the chart were reviewed this encounter and updated as appropriate:    Review of Systems: No other skin or systemic complaints.  Objective  Well appearing patient in no apparent distress; mood and affect are within normal limits.  A focused examination was performed including the left upper lip . Relevant physical exam findings are noted in the Assessment and Plan.  Objective  L upper lip central vermillion edge x 1, central upper cutaneous lip x 1 (2): Pink scaly macule upper cutaneous lip Pink macule no scale at L upper lip vermillion   Images      Objective  Face: Diffuse scaly erythematous macules with underlying dyspigmentation.   Assessment & Plan  AK (actinic keratosis) (2) L upper lip central vermillion edge x 1, central upper cutaneous lip x 1  Cryotherapy today.  This is 3rd treatment per patient.  Patient reassured not suspicious for skin cancer at this time Consider Efudex topical chemotherapy cream/Vit D cream if not resolved at follow up appointment.   Destruction of lesion - L upper lip central vermillion edge x 1, central upper cutaneous lip x 1  Destruction method: cryotherapy   Informed consent: discussed and consent obtained   Lesion destroyed using liquid nitrogen: Yes   Region frozen until ice ball extended beyond lesion: Yes   Outcome: patient tolerated procedure well with no complications   Post-procedure details: wound care instructions given    Actinic skin damage Face  Recommend daily broad spectrum sunscreen SPF 30+ to sun-exposed areas, reapply every 2 hours as needed. Call for new or changing lesions. Samples given of CeraVe sunscreen products.  Recommend mineral sunscreen protection since pt has sensitive skin.   Return in about 7 weeks (around 02/03/2020).   Luther Redo, CMA, am acting as scribe for Brendolyn Patty, MD .

## 2019-12-20 ENCOUNTER — Telehealth: Payer: Self-pay | Admitting: Family Medicine

## 2019-12-20 DIAGNOSIS — M5442 Lumbago with sciatica, left side: Secondary | ICD-10-CM

## 2019-12-20 DIAGNOSIS — G8929 Other chronic pain: Secondary | ICD-10-CM

## 2019-12-20 NOTE — Telephone Encounter (Signed)
The referral was faxed to [facility]- she may want to give them a call  I would like her to get started, they may be able to get her some relief   Unsure what imaging to order re: MRI given the surgery she had - please ask the neurosurg office what test they need (? Can she have mri or does she need a different imaging test due to past surgery) -or fax this note to their office and I will order it

## 2019-12-20 NOTE — Telephone Encounter (Signed)
Patient called today in regards to her back pain. She stated she spoke with Dr Richardo Hanks Office @ [facility]. They stated that the provider would like to have MRI results before the patient comes for a visit. Patient was advised to call our office to see if this could be order and done before they schedule the appointment   Patient  would also like to know if she should continue on with the Physical Therapy Referral or wait until after she see Dr Renita Papa and see what he suggest.  She has not heard from [facility] yet about referral    Please advise

## 2019-12-21 NOTE — Telephone Encounter (Signed)
[place] and left a VM with Dr. Pearline Cables assistant asking question about MRI order, and requested their office to call us back.  Called pt and she has heard for PT so appt is scheduled. She will await our call back regarding MRI order

## 2019-12-22 NOTE — Telephone Encounter (Signed)
I will work on the referral -but need to know how long before contrast she needs labs for renal fxn? - her last labs were in December so I suspect she will need them  Do you know for sure? Thanks

## 2019-12-22 NOTE — Telephone Encounter (Signed)
Jody Avery with [facility] returned my call she said she checked with Dr. Arnoldo Morale and he is requesting [place] to order an MRI of her lower back with and w/o contrast. He said that it would help him eval her better if that's done before she sees him for her 1st appt because she is technically a new pt again and they recommend that new pt's have imaging studies done before their appts

## 2019-12-22 NOTE — Telephone Encounter (Signed)
Jody Avery, please call pt and ask her when her MRI is.  Please schedule within one week prior to her MRI.  Order is in.

## 2019-12-22 NOTE — Addendum Note (Signed)
Addended by: Loura Pardon A on: 12/22/2019 03:56 PM   Modules accepted: Orders

## 2019-12-22 NOTE — Telephone Encounter (Signed)
I ordered the test, thanks  She will need to come in for labs (I assume bun and cr)

## 2019-12-22 NOTE — Telephone Encounter (Signed)
Please advise on labs.

## 2019-12-27 ENCOUNTER — Telehealth: Payer: Self-pay | Admitting: Family Medicine

## 2019-12-27 DIAGNOSIS — M5137 Other intervertebral disc degeneration, lumbosacral region: Secondary | ICD-10-CM

## 2019-12-27 NOTE — Telephone Encounter (Signed)
-----   Message from Cloyd Stagers, RT sent at 12/23/2019 11:23 AM EDT ----- Regarding: Lab Orders for Wednesday 4.28.2021 Please place lab orders for Wednesday 4.28.2021, appt notes state "MRI labs" Thank you, Dyke Maes RT(R)

## 2019-12-28 ENCOUNTER — Other Ambulatory Visit (INDEPENDENT_AMBULATORY_CARE_PROVIDER_SITE_OTHER): Payer: Medicare Other

## 2019-12-28 ENCOUNTER — Other Ambulatory Visit: Payer: Self-pay

## 2019-12-28 DIAGNOSIS — M5137 Other intervertebral disc degeneration, lumbosacral region: Secondary | ICD-10-CM | POA: Diagnosis not present

## 2019-12-28 LAB — BASIC METABOLIC PANEL
BUN: 9 mg/dL (ref 6–23)
CO2: 29 mEq/L (ref 19–32)
Calcium: 9.7 mg/dL (ref 8.4–10.5)
Chloride: 100 mEq/L (ref 96–112)
Creatinine, Ser: 0.71 mg/dL (ref 0.40–1.20)
GFR: 80.07 mL/min (ref >60.00)
Glucose, Bld: 99 mg/dL (ref 70–99)
Potassium: 4.2 mEq/L (ref 3.5–5.1)
Sodium: 138 mEq/L (ref 135–145)

## 2019-12-30 DIAGNOSIS — M545 Low back pain: Secondary | ICD-10-CM | POA: Diagnosis not present

## 2020-01-02 DIAGNOSIS — M545 Low back pain: Secondary | ICD-10-CM | POA: Diagnosis not present

## 2020-01-04 DIAGNOSIS — M545 Low back pain: Secondary | ICD-10-CM | POA: Diagnosis not present

## 2020-01-05 ENCOUNTER — Other Ambulatory Visit: Payer: Self-pay

## 2020-01-05 ENCOUNTER — Ambulatory Visit
Admission: RE | Admit: 2020-01-05 | Discharge: 2020-01-05 | Disposition: A | Discharge status: Home / Self Care | Payer: Medicare Other | Source: Ambulatory Visit | Attending: Family Medicine | Admitting: Family Medicine

## 2020-01-05 DIAGNOSIS — G8929 Other chronic pain: Secondary | ICD-10-CM

## 2020-01-05 DIAGNOSIS — M5442 Lumbago with sciatica, left side: Secondary | ICD-10-CM | POA: Diagnosis not present

## 2020-01-05 DIAGNOSIS — M5126 Other intervertebral disc displacement, lumbar region: Secondary | ICD-10-CM | POA: Diagnosis not present

## 2020-01-05 DIAGNOSIS — M48061 Spinal stenosis, lumbar region without neurogenic claudication: Secondary | ICD-10-CM | POA: Diagnosis not present

## 2020-01-05 MED ORDER — GADOBUTROL 1 MMOL/ML IV SOLN
7.5000 mL | Freq: Once | INTRAVENOUS | Status: AC | PRN
  Administered 2020-01-05: 20:00:00 7.5 mL via INTRAVENOUS

## 2020-01-06 ENCOUNTER — Telehealth: Payer: Self-pay | Admitting: *Deleted

## 2020-01-06 NOTE — Telephone Encounter (Signed)
Patient returned your call for MRI results.

## 2020-01-06 NOTE — Telephone Encounter (Signed)
Address through result notes

## 2020-01-06 NOTE — Telephone Encounter (Signed)
Left VM requesting pt to call the office back regarding MRI results  

## 2020-01-10 DIAGNOSIS — M545 Low back pain: Secondary | ICD-10-CM | POA: Diagnosis not present

## 2020-01-12 DIAGNOSIS — M545 Low back pain: Secondary | ICD-10-CM | POA: Diagnosis not present

## 2020-01-13 DIAGNOSIS — R030 Elevated blood-pressure reading, without diagnosis of hypertension: Secondary | ICD-10-CM | POA: Diagnosis not present

## 2020-01-13 DIAGNOSIS — R1032 Left lower quadrant pain: Secondary | ICD-10-CM | POA: Diagnosis not present

## 2020-01-16 DIAGNOSIS — M545 Low back pain: Secondary | ICD-10-CM | POA: Diagnosis not present

## 2020-01-25 DIAGNOSIS — M545 Low back pain: Secondary | ICD-10-CM | POA: Diagnosis not present

## 2020-01-27 DIAGNOSIS — M545 Low back pain: Secondary | ICD-10-CM | POA: Diagnosis not present

## 2020-01-31 DIAGNOSIS — M810 Age-related osteoporosis without current pathological fracture: Secondary | ICD-10-CM | POA: Diagnosis not present

## 2020-02-07 ENCOUNTER — Ambulatory Visit (INDEPENDENT_AMBULATORY_CARE_PROVIDER_SITE_OTHER): Payer: Medicare Other | Admitting: Dermatology

## 2020-02-07 ENCOUNTER — Other Ambulatory Visit: Payer: Self-pay

## 2020-02-07 DIAGNOSIS — D485 Neoplasm of uncertain behavior of skin: Secondary | ICD-10-CM

## 2020-02-07 DIAGNOSIS — L570 Actinic keratosis: Secondary | ICD-10-CM

## 2020-02-07 DIAGNOSIS — Z86007 Personal history of in-situ neoplasm of skin: Secondary | ICD-10-CM

## 2020-02-07 DIAGNOSIS — L578 Other skin changes due to chronic exposure to nonionizing radiation: Secondary | ICD-10-CM

## 2020-02-07 DIAGNOSIS — B078 Other viral warts: Secondary | ICD-10-CM

## 2020-02-07 DIAGNOSIS — C4491 Basal cell carcinoma of skin, unspecified: Secondary | ICD-10-CM

## 2020-02-07 DIAGNOSIS — C44712 Basal cell carcinoma of skin of right lower limb, including hip: Secondary | ICD-10-CM | POA: Diagnosis not present

## 2020-02-07 HISTORY — DX: Basal cell carcinoma of skin, unspecified: C44.91

## 2020-02-07 NOTE — Progress Notes (Signed)
° °  Follow-Up Visit   Subjective  Jody Avery is a 74 y.o. female who presents for the following: Follow-up.  Patient here today for AK follow up at left upper lip central vermillion edge and central upper cutaneous lip. Spots have been treated with LN2.  Patient also has a spot at right lower leg that she would like checked. She is not sure how long it has been there but it does not seem to go away. There is also a wart on the bottom of the left foot that she has been using salicylic acid on and would like checked.   The following portions of the chart were reviewed this encounter and updated as appropriate:      Review of Systems:  No other skin or systemic complaints except as noted in HPI or Assessment and Plan.  Objective  Well appearing patient in no apparent distress; mood and affect are within normal limits.  A focused examination was performed including face, legs, feet. Relevant physical exam findings are noted in the Assessment and Plan.  Objective  Left Upper Vermilion Lip, central upper cutaneous lip: Pink macule, no scale at vermilion edge, L upper cutaneous lip is clear  Objective  Left Dorsal Hand: Dyspigmented smooth macule or patch.   Objective  Right Lower Pretibia: 7 x 54mm pink scaly macule     Objective  Left Plantar foot at ball x 2: Verrucous papules -- Discussed viral etiology and contagion.    Assessment & Plan  AK (actinic keratosis) Left Upper Vermilion Lip, central upper cutaneous lip  Clear today. Patient's husband has a prescription 5FU/calcipotriene that she can use to affected areas at lip bid x 5 days if she notices any flakiness.   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   Neoplasm of uncertain behavior of skin Right Lower Pretibia  Skin / nail biopsy Type of biopsy: tangential   Informed consent: discussed and  consent obtained   Patient was prepped and draped in usual sterile fashion: Area prepped with alcohol. Anesthesia: the lesion was anesthetized in a standard fashion   Anesthetic:  1% lidocaine w/ epinephrine 1-100,000 buffered w/ 8.4% NaHCO3 Instrument used: flexible razor blade   Hemostasis achieved with: pressure, aluminum chloride and electrodesiccation   Outcome: patient tolerated procedure well   Post-procedure details: wound care instructions given   Post-procedure details comment:  Ointment and small bandage applied  Specimen 1 - Surgical pathology Differential Diagnosis: r/o BCC Check Margins: No 7 x 38mm pink scaly macule  Other viral warts Left Plantar foot at ball x 2  Discussed viral etiology and risk of spread.  Discussed multiple treatments may be required to clear warts.  Pt defers Ln2 today. Cont OTC salicylic acid qd  Actinic Damage - diffuse scaly erythematous macules with underlying dyspigmentation - Recommend daily broad spectrum sunscreen SPF 30+ to sun-exposed areas, reapply every 2 hours as needed.  - Call for new or changing lesions.   Return in about 6 months (around 08/08/2020) for AK follow up, sooner pending bx.  Graciella Belton, RMA, am acting as scribe for Brendolyn Patty, MD .  Documentation: I have reviewed the above documentation for accuracy and completeness, and I agree with the above.  Brendolyn Patty MD

## 2020-02-07 NOTE — Patient Instructions (Addendum)

## 2020-02-08 ENCOUNTER — Telehealth: Payer: Self-pay

## 2020-02-08 NOTE — Telephone Encounter (Signed)
Advised pt of bx result and scheduled pt for [facility] on 02/29/20./sh

## 2020-02-08 NOTE — Telephone Encounter (Signed)
-----   Message from Brendolyn Patty, MD sent at 02/08/2020  5:40 PM EDT ----- Skin , right lower pretibia SUPERFICIAL BASAL CELL CARCINOMA  BCC- needs EDC

## 2020-02-29 ENCOUNTER — Ambulatory Visit (INDEPENDENT_AMBULATORY_CARE_PROVIDER_SITE_OTHER): Payer: Medicare Other | Admitting: Dermatology

## 2020-02-29 ENCOUNTER — Other Ambulatory Visit: Payer: Self-pay

## 2020-02-29 DIAGNOSIS — L570 Actinic keratosis: Secondary | ICD-10-CM

## 2020-02-29 DIAGNOSIS — C44712 Basal cell carcinoma of skin of right lower limb, including hip: Secondary | ICD-10-CM

## 2020-02-29 NOTE — Patient Instructions (Signed)

## 2020-02-29 NOTE — Progress Notes (Signed)
   Follow-Up Visit   Subjective  Jody Avery is a 74 y.o. female who presents for the following: Skin Cancer.  Patient here today to have a biopsy proven BCC treated at right lower pretibial. H/o AK on upper lip treated with LN2 several times.  Recheck today.  The following portions of the chart were reviewed this encounter and updated as appropriate:      Review of Systems:  No other skin or systemic complaints except as noted in HPI or Assessment and Plan.  Objective  Well appearing patient in no apparent distress; mood and affect are within normal limits.  A focused examination was performed including right leg. Relevant physical exam findings are noted in the Assessment and Plan.  Objective  Right Lower Pretibial: Pink crusted biopsy site  Objective  Mid Upper Vermilion Lip: Pink macule, no scale   Assessment & Plan  Basal cell carcinoma (BCC) of skin of right lower extremity including hip Right Lower Pretibial  Destruction of lesion  Destruction method: electrodesiccation and curettage   Informed consent: discussed and consent obtained   Timeout:  patient name, date of birth, surgical site, and procedure verified Anesthesia: the lesion was anesthetized in a standard fashion   Anesthesia comment:  Area prepped with isopropyl alcohol Anesthetic:  1% lidocaine w/ epinephrine 1-100,000 buffered w/ 8.4% NaHCO3 Curettage performed in three different directions: Yes   Electrodesiccation performed over the curetted area: Yes   Lesion length (cm):  0.5 Lesion width (cm):  0.5 Margin per side (cm):  0.1 Final wound size (cm):  0.7 Hemostasis achieved with:  pressure, aluminum chloride and electrodesiccation Outcome: patient tolerated procedure well with no complications   Post-procedure details: wound care instructions given   Additional details:  Mupirocin ointment and Bandaid applied    AK (actinic keratosis) Mid Upper Vermilion Lip  Clear. Observe for  recurrence. Call clinic for new or changing lesions.  Recommend regular skin exams, daily broad-spectrum spf 30+ sunscreen use, and photoprotection.     Return in about 3 months (around 05/31/2020) for recheck [place] at right ankle and Hx AK at lips.  Graciella Belton, RMA, am acting as scribe for Brendolyn Patty, MD .  Documentation: I have reviewed the above documentation for accuracy and completeness, and I agree with the above.  Brendolyn Patty MD

## 2020-03-07 DIAGNOSIS — M810 Age-related osteoporosis without current pathological fracture: Secondary | ICD-10-CM | POA: Diagnosis not present

## 2020-04-11 ENCOUNTER — Telehealth: Payer: Self-pay

## 2020-04-11 NOTE — Telephone Encounter (Signed)
Pt notified of Dr. Marliss Coots comments and verbalized understanding. She will go get covid tested and call us back if she needs an appt. ER precautions give

## 2020-04-11 NOTE — Telephone Encounter (Signed)
Please get tested for covid as soon as possible and call us with result.  A virtual visit with first available may also be a good idea  If severe-go to ER Do not think this is related to her prolia

## 2020-04-11 NOTE — Telephone Encounter (Signed)
Pt gets fever 100 and first time happened 03/25/20 and pt knew she had a virus. Ears hurt and H/A that hurts on side of head and top of head also; pt thinks could be lymph nodes.laying down makes head hurt worse; no cough but pt has had clear sinus drainage that started in July like seasonal allergies. Pt had prolia shot on 03/07/20 and pt does not think prolia has anything to do with symptoms.  On 04/08/20 pt had ear and head pain again; on 04/09/20 pt started with fever again at 99.7 and ringing in ears and H/A. Pt felt overall like pt had a virus. Fever went to 102 on 04/10/20 and pt had lightheadedness on and off. This morning had sweating and chills and temp this morning 99.5. No CP or SOB. Last 2 days urine is darker and pt may not be drinking as much water. No odor to urine, no abd or back pain and no frequency or burning or pain upon urination. pts main concern is on and off H/A and fever. Today pts temp is 98.7 now. No H/A now but between ears pts head feels sore. Pt has not had covid test and before gets covid test or schedules virtual appt pt request cb after Dr Glori Bickers reviews the note. To pts knowledge has not been exposed to + covid virus. Pt has not had covid vaccine due to wanting to see how pt would respond to the prolia vaccine  Given to pt on 03/07/20. Pt request cb. [facility].

## 2020-04-12 ENCOUNTER — Encounter: Payer: Self-pay | Admitting: Family Medicine

## 2020-04-17 ENCOUNTER — Other Ambulatory Visit: Payer: Self-pay

## 2020-04-17 ENCOUNTER — Ambulatory Visit (INDEPENDENT_AMBULATORY_CARE_PROVIDER_SITE_OTHER): Payer: Medicare Other | Admitting: Family Medicine

## 2020-04-17 ENCOUNTER — Encounter: Payer: Self-pay | Admitting: Family Medicine

## 2020-04-17 VITALS — BP 124/72 | HR 82 | Temp 96.9°F | Ht 64.0 in | Wt 188.1 lb

## 2020-04-17 DIAGNOSIS — R5382 Chronic fatigue, unspecified: Secondary | ICD-10-CM

## 2020-04-17 DIAGNOSIS — R1032 Left lower quadrant pain: Secondary | ICD-10-CM

## 2020-04-17 DIAGNOSIS — R300 Dysuria: Secondary | ICD-10-CM | POA: Diagnosis not present

## 2020-04-17 DIAGNOSIS — R5383 Other fatigue: Secondary | ICD-10-CM | POA: Insufficient documentation

## 2020-04-17 DIAGNOSIS — M810 Age-related osteoporosis without current pathological fracture: Secondary | ICD-10-CM

## 2020-04-17 DIAGNOSIS — K5903 Drug induced constipation: Secondary | ICD-10-CM | POA: Diagnosis not present

## 2020-04-17 DIAGNOSIS — K5900 Constipation, unspecified: Secondary | ICD-10-CM | POA: Insufficient documentation

## 2020-04-17 DIAGNOSIS — R5081 Fever presenting with conditions classified elsewhere: Secondary | ICD-10-CM | POA: Diagnosis not present

## 2020-04-17 LAB — POC URINALSYSI DIPSTICK (AUTOMATED)
Bilirubin, UA: NEGATIVE
Blood, UA: NEGATIVE
Glucose, UA: NEGATIVE
Ketones, UA: NEGATIVE
Leukocytes, UA: NEGATIVE
Nitrite, UA: NEGATIVE
Protein, UA: NEGATIVE
Spec Grav, UA: 1.01 (ref 1.010–1.025)
Urobilinogen, UA: 0.2 E.U./dL
pH, UA: 7 (ref 5.0–8.0)

## 2020-04-17 MED ORDER — METRONIDAZOLE 500 MG PO TABS: 500.0000 mg | ORAL_TABLET | Freq: Three times a day (TID) | ORAL | 0 refills | Status: DC

## 2020-04-17 MED ORDER — CIPROFLOXACIN HCL 500 MG PO TABS
500.0000 mg | ORAL_TABLET | Freq: Two times a day (BID) | ORAL | 0 refills | Status: DC
Start: 2020-04-17 — End: 2020-04-20

## 2020-04-17 NOTE — Patient Instructions (Addendum)
For constipation- you may need to hold calcium (and take vit D)  Call Dr Joycie Peek office if you need to   Drink lots of fluids   miralax is good for constipation in the future   Urine looks clear but we will culture it   Labs today -including labs for tick fever   Diverticulitis is possible with the area you hurt  Stick with clear fluids and bland diet for now  Avoid nuts /seeds/corn or popcorn - this can worsen diverticulitis

## 2020-04-17 NOTE — Assessment & Plan Note (Signed)
Worse lately on ca  She will d/w her endocrinologist  Suggested miralax

## 2020-04-17 NOTE — Telephone Encounter (Signed)
Pt lmovm stating that she has had the covid test and it was negative... Also her temperature has 98.4.Jody KitchenMarland KitchenMarland Avery pt reports she believes she may have a UTI... I do not see a result from covid test in chart, pt would like to be seen in person to be evaluated for possible UTI...Jody Avery please advise

## 2020-04-17 NOTE — Assessment & Plan Note (Signed)
Labs today  Likely linked to her intermittent fever/abd pain  Will tx empirically for diverticulitis

## 2020-04-17 NOTE — Assessment & Plan Note (Signed)
Clear ua  Sample sent for cx to entirely r/o infection  Most of her low abd/pelvic pain is L sided  No blood to indicate renal stone
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
## 2020-04-17 NOTE — Assessment & Plan Note (Signed)
Sees endocrinologist On prolia

## 2020-04-17 NOTE — Assessment & Plan Note (Addendum)
With tenderness and intermittent fever Disc poss of diverticulitis  Labs drawn  Empirically started her on cipro and flagyl  May need to consider CT scan if worse or no imp  Also pend urine culture (clear ua however)  If symptoms become severe adv pt to go to ER Enc clear fluids  Then bland diet  Avoid nuts/seeds/corn

## 2020-04-17 NOTE — Assessment & Plan Note (Addendum)
On and off  (note now) Now with LLQ abd pain/also dysuria  Disc possibility of diverticulitis given loc of pain  Urine cx and labs ordered Included tick labs Noted neg covid test result which is re assuring

## 2020-04-17 NOTE — Telephone Encounter (Signed)
Pt confirmed no Covid sxs at all, I went through the list and pt had no sxs, only UTI sxs. Pt also has a copy of neg covid test that she can bring. appt changed from virtual to in office to check for UTI

## 2020-04-17 NOTE — Telephone Encounter (Signed)
If no fever or respiratory symptoms and she is only coming for uti-that is ok.  Please ask her to covid screening questions.  Also ask her to bring copy of neg test with her so we can scan it in if she can  Thanks

## 2020-04-18 LAB — CBC WITH DIFFERENTIAL/PLATELET
Basophils Absolute: 0.1 10*3/uL (ref 0.0–0.1)
Basophils Relative: 0.9 % (ref 0.0–3.0)
Eosinophils Absolute: 0.2 10*3/uL (ref 0.0–0.7)
Eosinophils Relative: 2 % (ref 0.0–5.0)
HCT: 35.8 % — ABNORMAL LOW (ref 36.0–46.0)
Hemoglobin: 12.3 g/dL (ref 12.0–15.0)
Lymphocytes Relative: 17.8 % (ref 12.0–46.0)
Lymphs Abs: 1.8 10*3/uL (ref 0.7–4.0)
MCHC: 34.4 g/dL (ref 30.0–36.0)
MCV: 90.4 fl (ref 78.0–100.0)
Monocytes Absolute: 1.1 10*3/uL — ABNORMAL HIGH (ref 0.1–1.0)
Monocytes Relative: 11.4 % (ref 3.0–12.0)
Neutro Abs: 6.8 10*3/uL (ref 1.4–7.7)
Neutrophils Relative %: 67.9 % (ref 43.0–77.0)
Platelets: 246 10*3/uL (ref 150.0–400.0)
RBC: 3.96 Mil/uL (ref 3.87–5.11)
RDW: 13 % (ref 11.5–15.5)
WBC: 10 10*3/uL (ref 4.0–10.5)

## 2020-04-18 LAB — COMPREHENSIVE METABOLIC PANEL
ALT: 12 U/L (ref 0–35)
AST: 14 U/L (ref 0–37)
Albumin: 3.9 g/dL (ref 3.5–5.2)
Alkaline Phosphatase: 58 U/L (ref 39–117)
BUN: 8 mg/dL (ref 6–23)
CO2: 26 mEq/L (ref 19–32)
Calcium: 8.7 mg/dL (ref 8.4–10.5)
Chloride: 98 mEq/L (ref 96–112)
Creatinine, Ser: 0.74 mg/dL (ref 0.40–1.20)
GFR: 76.28 mL/min (ref >60.00)
Glucose, Bld: 102 mg/dL — ABNORMAL HIGH (ref 70–99)
Potassium: 4.3 mEq/L (ref 3.5–5.1)
Sodium: 133 mEq/L — ABNORMAL LOW (ref 135–145)
Total Bilirubin: 0.6 mg/dL (ref 0.2–1.2)
Total Protein: 6.8 g/dL (ref 6.0–8.3)

## 2020-04-18 LAB — URINE CULTURE
MICRO NUMBER:: 10836624
Result:: NO GROWTH
SPECIMEN QUALITY:: ADEQUATE

## 2020-04-19 ENCOUNTER — Telehealth: Payer: Self-pay

## 2020-04-19 NOTE — Telephone Encounter (Signed)
She may have shingles based on that description- is there any availability for acute visit with myself or anyone else tomorrow?   I pended diflucan to send to pharmacy of choice It sounds like she may have yeast or thrush

## 2020-04-19 NOTE — Telephone Encounter (Signed)
Saw Dr Glori Bickers 2 days ago for possible Diverticulitis. Started Cipro and Flagyl. Yesterday, she started having pain in her left upper/middle back area near shoulder blade. Pain moved to the front during the night. Back feels sore but pain is under left breast/rib area. Seems a little better now. Wonders if it is gas because she did pass some gas and felt better. Took Colace Tuesday night and had several small BMs yesterday. Could the pain be constipation? She has no appetite.   Please advise [phone]

## 2020-04-19 NOTE — Telephone Encounter (Signed)
Pt called back; pt said there is a 3 - 4" area on lt side of back that is red and puffy and pts husband said it was difficult to be sure but thinks the puffy spots may have small amts of fluid in them like a blister. The spots on lt side of abd are red and also appear to have small amts of fluid in them. Pt has vaginal itching up inside but no discharge. Pt also has white coating on tongue and back of throat so pt thinks she definitely has a yeast issue. Since Tues pt has same amt of burning when urinates; no better but no worse. Pt does not have pain or frequency of urine. No blood in urine. Lt lower abd pain is better but still slightly tender at times but is definitely better. No fever. Pt is eating crackers and trying to concentrate on drinking more. Pt wants to know if OK to eat yogurt; pt thought that might help yeast but read somewhere if has diverticulitis not to eat yogurt. [facility].

## 2020-04-19 NOTE — Telephone Encounter (Signed)
Any rash ?   (would want to r/o shingles)  Gas could cause this  Any urinary symptoms or blood in urine?  How is her left lower abd pain -any better or worse?   Thanks

## 2020-04-20 ENCOUNTER — Ambulatory Visit (INDEPENDENT_AMBULATORY_CARE_PROVIDER_SITE_OTHER): Payer: Medicare Other | Admitting: Family Medicine

## 2020-04-20 ENCOUNTER — Other Ambulatory Visit: Payer: Self-pay

## 2020-04-20 VITALS — BP 110/80 | HR 92 | Temp 97.7°F | Wt 182.2 lb

## 2020-04-20 DIAGNOSIS — B0229 Other postherpetic nervous system involvement: Secondary | ICD-10-CM | POA: Insufficient documentation

## 2020-04-20 DIAGNOSIS — B370 Candidal stomatitis: Secondary | ICD-10-CM | POA: Diagnosis not present

## 2020-04-20 DIAGNOSIS — B029 Zoster without complications: Secondary | ICD-10-CM

## 2020-04-20 DIAGNOSIS — R1032 Left lower quadrant pain: Secondary | ICD-10-CM | POA: Diagnosis not present

## 2020-04-20 DIAGNOSIS — R5382 Chronic fatigue, unspecified: Secondary | ICD-10-CM

## 2020-04-20 DIAGNOSIS — B3731 Acute candidiasis of vulva and vagina: Secondary | ICD-10-CM | POA: Insufficient documentation

## 2020-04-20 DIAGNOSIS — B373 Candidiasis of vulva and vagina: Secondary | ICD-10-CM

## 2020-04-20 MED ORDER — FLUCONAZOLE 150 MG PO TABS
ORAL_TABLET | ORAL | 0 refills | Status: DC
Start: 2020-04-20 — End: 2020-07-30

## 2020-04-20 MED ORDER — ONDANSETRON HCL 8 MG PO TABS
8.0000 mg | ORAL_TABLET | Freq: Three times a day (TID) | ORAL | 0 refills | Status: DC | PRN
Start: 2020-04-20 — End: 2020-07-30

## 2020-04-20 MED ORDER — VALACYCLOVIR HCL 1 G PO TABS
1000.0000 mg | ORAL_TABLET | Freq: Three times a day (TID) | ORAL | 0 refills | Status: DC
Start: 2020-04-20 — End: 2020-07-30

## 2020-04-20 NOTE — Assessment & Plan Note (Signed)
No doubt from recent abx  tx with diflucan sent in yesterday Update if not starting to improve in a week or if worsening    (also has thrush)

## 2020-04-20 NOTE — Assessment & Plan Note (Signed)
In retrospect-could be from emerging shingles

## 2020-04-20 NOTE — Assessment & Plan Note (Signed)
Now that she has a rash, suspect from her shingles  inst to stop the cipro and flagyl (she was not tolerating anyway)  zofran px for nausea prn  Disc imp of hydration

## 2020-04-20 NOTE — Assessment & Plan Note (Signed)
L side T10-12 dermatome  This explains the LLQ abd pain from earlier  Pain is in back now as well  Px valtrex 1g tid for 7d Tylenol prn (she is intol of tramadol or narcotics)  inst to keep clean with soap and water and be gentle F/u prn

## 2020-04-20 NOTE — Telephone Encounter (Signed)
Rx sent to pharmacy and pt will come in at 12:30 pm to have rash checked

## 2020-04-20 NOTE — Assessment & Plan Note (Addendum)
No doubt from recent abx Diflucan sent yesterday-she will take as directed  If not fully improved-can consider nystatin soln

## 2020-04-20 NOTE — Patient Instructions (Addendum)
Stay off the antibiotics  Try the zofran for nausea   Try to drink lots of fluid when you are able to prevent dehydration   Take valtrex for shingles (it can lessen it but does not cure) as directed   Take the diflucan for yeast and thrush   For pain -use tylenol  If pain is unbearable - we may have to consider a stronger pain medication which I know you do not tolerate

## 2020-04-20 NOTE — Progress Notes (Signed)
Subjective:    Patient ID: Jody Avery, female    DOB: [birth year], 74 y.o.   MRN: [id number]  This visit occurred during the SARS-CoV-2 public health emergency.  Safety protocols were in place, including screening questions prior to the visit, additional usage of staff PPE, and extensive cleaning of exam room while observing appropriate contact time as indicated for disinfecting solutions.    HPI Pt presents for new rash- ? Possible shingles   noticed a rash yesterday on L side of back and left abdomen   Also thinks she was getting vaginal yeast infx (itching) and thrush (white tongue)  We did send in diflucan for that     She was seen last time for fatigue /malaise with intermittent elevated temp (covid negative test)  She had LLQ abd pain and tenderness-was empirically tx with cipro and flagyl   Lab Results  Component Value Date   CREATININE 0.74 04/17/2020   BUN 8 04/17/2020   NA 133 (L) 04/17/2020   K 4.3 04/17/2020   CL 98 04/17/2020   CO2 26 04/17/2020   Lab Results  Component Value Date   ALT 12 04/17/2020   AST 14 04/17/2020   ALKPHOS 58 04/17/2020   BILITOT 0.6 04/17/2020   Lab Results  Component Value Date   WBC 10.0 04/17/2020   HGB 12.3 04/17/2020   HCT 35.8 (L) 04/17/2020   MCV 90.4 04/17/2020   PLT 246.0 04/17/2020    Tick labs done So far neg-but still prelim on RMSF IgM        Review of Systems     Objective:   Physical Exam Constitutional:      General: She is not in acute distress.    Appearance: Normal appearance. She is obese. She is not ill-appearing or diaphoretic.  HENT:     Head: Normocephalic and atraumatic.     Mouth/Throat:     Mouth: Mucous membranes are moist.     Comments: White coating on tongue consistent with thrush Eyes:     General: No scleral icterus.       Right eye: No discharge.        Left eye: No discharge.     Conjunctiva/sclera: Conjunctivae normal.     Pupils: Pupils are equal, round, and  reactive to light.  Cardiovascular:     Rate and Rhythm: Regular rhythm. Tachycardia present.     Pulses: Normal pulses.     Heart sounds: Murmur heard.      Comments: Pulse of 92 -mild tachycardia Abdominal:     General: Abdomen is flat. Bowel sounds are normal.  Musculoskeletal:        General: Tenderness present.     Cervical back: Normal range of motion and neck supple.  Lymphadenopathy:     Cervical: No cervical adenopathy.  Skin:    Findings: Erythema and rash present.     Comments: Erythematous vesicular rash in patches over L back/side abdomen in T10 thorugh T12 dermatomes  Consistent with zoster   Neurological:     Mental Status: She is alert.     Sensory: No sensory deficit.     Coordination: Coordination normal.  Psychiatric:        Mood and Affect: Mood is anxious.     Comments: Feels nauseated and miserable            Assessment & Plan:   Problem List Items Addressed This Visit      Digestive   Ritta Slot  No doubt from recent abx Diflucan sent yesterday-she will take as directed  If not fully improved-can consider nystatin soln       Relevant Medications   valACYclovir (VALTREX) 1000 MG tablet     Genitourinary   Yeast vaginitis    No doubt from recent abx  tx with diflucan sent in yesterday Update if not starting to improve in a week or if worsening    (also has thrush)       Relevant Medications   valACYclovir (VALTREX) 1000 MG tablet     Other   Left lower quadrant abdominal pain    Now that she has a rash, suspect from her shingles  inst to stop the cipro and flagyl (she was not tolerating anyway)  zofran px for nausea prn  Disc imp of hydration       Fatigue    In retrospect-could be from emerging shingles      Shingles - Primary    L side T10-12 dermatome  This explains the LLQ abd pain from earlier  Pain is in back now as well  Px valtrex 1g tid for 7d Tylenol prn (she is intol of tramadol or narcotics)  inst to keep  clean with soap and water and be gentle F/u prn       Relevant Medications   valACYclovir (VALTREX) 1000 MG tablet

## 2020-04-26 ENCOUNTER — Telehealth: Payer: Self-pay

## 2020-04-26 NOTE — Telephone Encounter (Signed)
The valtrex is a 7 day course (does not help beyond that)  How is the rash looking?  Increase fluids and do take miralax as well (can take more than once daily if needed and takes a while to work but should help)

## 2020-04-26 NOTE — Telephone Encounter (Signed)
Pt left msg on VM requesting a call back in reference to her recent Dx of shingles... Called to get more information as pt just requested someone call her back, no answer

## 2020-04-26 NOTE — Telephone Encounter (Signed)
Pt left v/m returning missed call.

## 2020-04-26 NOTE — Telephone Encounter (Signed)
Pt reports that she is almost finished with Valtrex and wanted to make sure the Rx may not need to be extended as she is having pain... pt has been taking Tylenol OTC 2-3 times daily with no relief... pt reports that she is afraid to take any pain meds but has been uncomfortable.   Additionally, pt reports that she has not had BM since 04/20/2020 and she has tried 2 Colace with no relief, but she has not been eating much, wanted to see if she should also try Mirilax

## 2020-04-27 NOTE — Telephone Encounter (Signed)
Patient advised.

## 2020-04-28 LAB — ROCKY MTN SPOTTED FVR ABS PNL(IGG+IGM)
RMSF IgG: POSITIVE — AB
RMSF IgM: 0.47 index (ref 0.00–0.89)

## 2020-04-28 LAB — LYME AB/WESTERN BLOT REFLEX
LYME DISEASE AB, QUANT, IGM: <0.8 index (ref 0.00–0.79)
Lyme IgG/IgM Ab: <0.91 {ISR} (ref 0.00–0.90)

## 2020-04-28 LAB — RMSF, IGG, IFA: RMSF, IGG, IFA: <1:64 {titer}

## 2020-04-30 ENCOUNTER — Telehealth: Payer: Self-pay | Admitting: *Deleted

## 2020-04-30 MED ORDER — NYSTATIN 100000 UNIT/ML MT SUSP
5.0000 mL | Freq: Three times a day (TID) | OROMUCOSAL | 0 refills | Status: DC
Start: 2020-04-30 — End: 2020-07-30

## 2020-04-30 NOTE — Telephone Encounter (Signed)
She can take 2 ES tylenol up to every 6 hours maximum  If she changes her mind-most people have no problem with gabapentin as long as it is started low

## 2020-04-30 NOTE — Telephone Encounter (Signed)
I sent nystatin mouthwash for the thrush   How is the rash looking?  She is intolerant to multiple pain medications I think (can she take any besides codeine? Has she taken tramadol in the past?)  If rash is healed we can try lidocaine patch    Gabapentin is an option also - please ask if she has taken it before  It is for nerve pain and can sedate a bit but it is not a narcotic  I do not see it on her med intol list   For the constipation-is she drinking water/fluids?  She can increase miralax to 3-4 times daily  Also if not having abd pain or vomiting-could carefully try a bottle of mag citrate over the counter with caution  Stool softeners are ok as well   Let me know and we will also plan f/u

## 2020-04-30 NOTE — Telephone Encounter (Signed)
Pt notified of Dr. Marliss Coots instructions regarding the mouthwash and constipation issues. Pt is drinking lots of water so she will increase miralax to 3-4x daily until BM are regular.   Pt said that her rash isn't improving yet. She still has fluid fill blisters and crust spots. Her husband said it doesn't look worse but it's not any better yet. Pt said she can't take pain med she has tried tramadol in the past and it made her "crazy acting", pt also said she had multiple family members tell her they had bad reactions to gabapentin and told her not to try it so pt said she is to afraid to try the gabapentin (never used it in the past). Pt said she is taking 500 mg extra strength tylenol but she didn't know if she could take more then 1 pill and if so how much and how often can she take the extra strength tylenol

## 2020-04-30 NOTE — Telephone Encounter (Signed)
Patient's husband left a voicemail stating that his wife is still having pain with shingles. Patient's husband stated that she still has not been to the bathroom to have a BM for 10-11 days. Mr.. Hodsdon stated that she has been taking Miralax two doses a day and still have not had a BM. Mr. Chaplin wants to know what else she should do. Called patient back and was advised  that she is still having pain from her shingles that is unbearable. Patient stated that she is also having some white foam and white spots on her tongue. Patient stated that she is not sure that the yeast infection has cleared up from the antibiotic that she was previously put on.  Patient wants to know what else she should do about not having a BM since miralax is not helping. Pharmacy [facility]

## 2020-05-01 NOTE — Telephone Encounter (Signed)
Patient informed and verbalized understanding.  Patient states she recently had a small bowel movement.

## 2020-05-09 ENCOUNTER — Telehealth: Payer: Self-pay | Admitting: *Deleted

## 2020-05-09 ENCOUNTER — Ambulatory Visit (INDEPENDENT_AMBULATORY_CARE_PROVIDER_SITE_OTHER): Payer: Medicare Other

## 2020-05-09 ENCOUNTER — Ambulatory Visit
Admission: EM | Admit: 2020-05-09 | Discharge: 2020-05-09 | Disposition: A | Discharge status: Home / Self Care | Payer: Medicare Other | Attending: Family Medicine | Admitting: Family Medicine

## 2020-05-09 ENCOUNTER — Other Ambulatory Visit: Payer: Self-pay

## 2020-05-09 DIAGNOSIS — R05 Cough: Secondary | ICD-10-CM | POA: Insufficient documentation

## 2020-05-09 DIAGNOSIS — B029 Zoster without complications: Secondary | ICD-10-CM | POA: Insufficient documentation

## 2020-05-09 DIAGNOSIS — Z20822 Contact with and (suspected) exposure to covid-19: Secondary | ICD-10-CM | POA: Diagnosis not present

## 2020-05-09 DIAGNOSIS — J45909 Unspecified asthma, uncomplicated: Secondary | ICD-10-CM | POA: Diagnosis not present

## 2020-05-09 DIAGNOSIS — R0602 Shortness of breath: Secondary | ICD-10-CM | POA: Diagnosis not present

## 2020-05-09 DIAGNOSIS — R059 Cough, unspecified: Secondary | ICD-10-CM

## 2020-05-09 DIAGNOSIS — R0789 Other chest pain: Secondary | ICD-10-CM | POA: Insufficient documentation

## 2020-05-09 MED ORDER — DEXAMETHASONE SODIUM PHOSPHATE 10 MG/ML IJ SOLN
6.0000 mg | Freq: Once | INTRAMUSCULAR | Status: AC
  Administered 2020-05-09: 6 mg via INTRAMUSCULAR

## 2020-05-09 NOTE — Discharge Instructions (Addendum)
Your EKG and chest x-ray were both normal today.  You have been given an injection of dexamethasone to assist with pain and inflammation due to your shingles and also possible asthma flareup.  Use your asthma inhaler as needed for shortness of breath.  Continue stay hydrated.  Covid test has been sent out and result will be back within 24 hours.  If you test positive it is 10 days of isolation.  You will be contacted for consideration of monoclonal antibody therapy as well.  As far as shingles pain is concerned continue taking Tylenol for pain relief.  You can take up to 3 g a day.  Follow-up with your PCP about pain management.  You have received COVID testing today either for positive exposure, concerning symptoms that could be related to COVID infection, screening purposes, or re-testing after confirmed positive.  Your test obtained today checks for active viral infection in the last 1-2 weeks. If your test is negative now, you can still test positive later. So, if you do develop symptoms you should either get re-tested and/or isolate x 10 days. Please follow CDC guidelines.  While Rapid antigen tests come back in 15-20 minutes, send out PCR/molecular test results typically come back within 24 hours. In the mean time, if you are symptomatic, assume this could be a positive test and treat/monitor yourself as if you do have COVID.   We will call with test results. Please download the MyChart app and set up a profile to access test results.   If symptomatic, go home and rest. Push fluids. Take Tylenol as needed for discomfort. Gargle warm salt water. Throat lozenges. Take Mucinex DM or Robitussin for cough. Humidifier in bedroom to ease coughing. Warm showers. Also review the COVID handout for more information.  COVID-19 INFECTION: The incubation period of COVID-19 is approximately 14 days after exposure, with most symptoms developing in roughly 4-5 days. Symptoms may range in severity from mild to  critically severe. Roughly 80% of those infected will have mild symptoms. People of any age may become infected with COVID-19 and have the ability to transmit the virus. The most common symptoms include: fever, fatigue, cough, body aches, headaches, sore throat, nasal congestion, shortness of breath, nausea, vomiting, diarrhea, changes in smell and/or taste.    COURSE OF ILLNESS Some patients may begin with mild disease which can progress quickly into critical symptoms. If your symptoms are worsening please call ahead to the Emergency Department and proceed there for further treatment. Recovery time appears to be roughly 1-2 weeks for mild symptoms and 3-6 weeks for severe disease.   GO IMMEDIATELY TO ER FOR FEVER YOU ARE UNABLE TO GET DOWN WITH TYLENOL, BREATHING PROBLEMS, CHEST PAIN, FATIGUE, LETHARGY, INABILITY TO EAT OR DRINK, ETC  QUARANTINE AND ISOLATION: To help decrease the spread of COVID-19 please remain isolated if you have COVID infection or are highly suspected to have COVID infection. This means -stay home and isolate to one room in the home if you live with others. Do not share a bed or bathroom with others while ill, sanitize and wipe down all countertops and keep common areas clean and disinfected. You may discontinue isolation if you have a mild case and are asymptomatic 10 days after symptom onset as long as you have been fever free >24 hours without having to take Motrin or Tylenol. If your case is more severe (meaning you develop pneumonia or are admitted in the hospital), you may have to isolate longer.  If you have been in close contact (within 6 feet) of someone diagnosed with COVID 19, you are advised to quarantine in your home for 14 days as symptoms can develop anywhere from 2-14 days after exposure to the virus. If you develop symptoms, you  must isolate.  Most current guidelines for COVID after exposure -isolate 10 days if you ARE NOT tested for COVID as long as symptoms do  not develop -isolate 7 days if you are tested and remain asymptomatic -You do not necessarily need to be tested for COVID if you have + exposure and        develop   symptoms. Just isolate at home x10 days from symptom onset During this global pandemic, CDC advises to practice social distancing, try to stay at least 18ft away from others at all times. Wear a face covering. Wash and sanitize your hands regularly and avoid going anywhere that is not necessary.  KEEP IN MIND THAT THE COVID TEST IS NOT 100% ACCURATE AND YOU SHOULD STILL DO EVERYTHING TO PREVENT POTENTIAL SPREAD OF VIRUS TO OTHERS (WEAR MASK, WEAR GLOVES, [place] HANDS AND SANITIZE REGULARLY). IF INITIAL TEST IS NEGATIVE, THIS MAY NOT MEAN YOU ARE DEFINITELY NEGATIVE. MOST ACCURATE TESTING IS DONE 5-7 DAYS AFTER EXPOSURE.   It is not advised by CDC to get re-tested after receiving a positive COVID test since you can still test positive for weeks to months after you have already cleared the virus.   *If you have not been vaccinated for COVID, I strongly suggest you consider getting vaccinated as long as there are no contraindications.

## 2020-05-09 NOTE — Telephone Encounter (Signed)
Agree with that-will watch for correspondence

## 2020-05-09 NOTE — Telephone Encounter (Signed)
Patient called stating that she has had shingles and the pain is still bad. Patient stated that her skin is tender and sore to the touch where she has shingles. Patient stated that she has been taking tylenol one every 6 hours because she is trying not to take too much. Patient stated that she is thinking about taking Benadryl for the pain.  Patient stated that she started with a dry cough Sunday and used her inhaler which it helped at that time.  Patient stated that she has used her inhaler today but it has not helped much. Patient stated that she has a dry cough, very weak, headache and SOB. Patient seemed to have some labored breathing while on the phone. Patient was advised with the symptoms that she is having she probably needs a face to face visit to be evaluated. Patient was given information on the [facility] because they have x-ray equipment if needed.  Patient stated that she will have her husband take her to [facility] in [place] today.
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
## 2020-05-09 NOTE — ED Triage Notes (Signed)
Patient in today w/ c/o SOB, left thoracic pain- Pt states she was dx'd w/ shingles approx. 3 wks ago. Patient also c/o H/A, cough.

## 2020-05-10 LAB — SARS CORONAVIRUS 2 (TAT 6-24 HRS): SARS Coronavirus 2: NEGATIVE

## 2020-05-10 MED ORDER — PREDNISONE 10 MG PO TABS: ORAL_TABLET | ORAL | 0 refills | Status: DC

## 2020-05-10 NOTE — Telephone Encounter (Signed)
Pt left a message on triage line asking to speak to [employer]. I called her back. She went to [facility]. She said she did get an injection of dexamethasone. It seemed to help some. Was able to get some sleep. She is asking for prednisone taper if recommended. Please send to [facility].

## 2020-05-10 NOTE — Telephone Encounter (Signed)
I sent the prednisone  Reviewed her UC notes Very reassuring  Keep [place] posted and I hope this continues to help

## 2020-05-10 NOTE — Telephone Encounter (Signed)
Pt notified Rx sent and advised of Dr. Tower's comments  

## 2020-06-05 ENCOUNTER — Ambulatory Visit (INDEPENDENT_AMBULATORY_CARE_PROVIDER_SITE_OTHER): Payer: Medicare Other | Admitting: Dermatology

## 2020-06-05 ENCOUNTER — Other Ambulatory Visit: Payer: Self-pay

## 2020-06-05 DIAGNOSIS — L304 Erythema intertrigo: Secondary | ICD-10-CM | POA: Diagnosis not present

## 2020-06-05 DIAGNOSIS — B029 Zoster without complications: Secondary | ICD-10-CM | POA: Diagnosis not present

## 2020-06-05 DIAGNOSIS — Z85828 Personal history of other malignant neoplasm of skin: Secondary | ICD-10-CM

## 2020-06-05 MED ORDER — KETOCONAZOLE 2 % EX CREA: TOPICAL_CREAM | CUTANEOUS | 3 refills | Status: DC

## 2020-06-05 MED ORDER — GABAPENTIN 300 MG PO CAPS: 600.0000 mg | ORAL_CAPSULE | Freq: Three times a day (TID) | ORAL | 0 refills | Status: DC

## 2020-06-05 NOTE — Progress Notes (Signed)
   Follow-Up Visit   Subjective  Jody Avery is a 74 y.o. female who presents for the following: Follow-up (Hx BCC Right lower pretibial ) and Shingles.  Patient started having pain on her left side 03/25/20 and rash came up on L abd, flank, back on 04/20/20. She was treated for shingles by Dr Glori Bickers with Valtrex 1000mg  TID x 1 week. Rash has mainly dried up but she has a lot of pain on the left side. She is taking Tylenol every 5-6 hours for the pain. She also has a rash under her left breast.    The following portions of the chart were reviewed this encounter and updated as appropriate:      Review of Systems:  No other skin or systemic complaints except as noted in HPI or Assessment and Plan.  Objective  Well appearing patient in no apparent distress; mood and affect are within normal limits.  A focused examination was performed including right lower pretibia. Relevant physical exam findings are noted in the Assessment and Plan.  Objective  Left Mid Back, Left Flank, Left Abd: Cluster pink patches and macules L mid back, L flank, L abd.  No vesicles, no crusting  Objective  Right Lower Pretibia: Well healed scar with no evidence of recurrence.   Objective  Left Inframammary: Mild erythema inframammary   Assessment & Plan  Herpes zoster without complication Left Mid Back, Left Flank, Left Abd  Rash resolved with persistent severe Post Herpetic Neuralgia  Advised patient this condition takes time to improve.  May try OTC Zostrix Cream, Icy Hot or Bengay, topical Voltaren.  For those, just apply to aas L side as directed on the label. Discussed alternating 1 XS Tylenol and 400 mg Ibuprofen every 4 hours prn pain or starting Gabapentin.  Discussed risk of liver inflammation if take too much Tylenol.  Patient will start Gabapentin 300mg  take 1 po TID. May increase to up to 6 capsules daily if needed. Dsp #180 0Rf.  May cause drowsiness.  Can just take 1-2 tabs before  bed, if causing too much drowsiness during day.  Do not take ibuprofen at the same time if using Voltaren gel.  Also discussed patient seeing Neurologist. Patient will call if she wants a referral.  gabapentin (NEURONTIN) 300 MG capsule - Left Mid Back, Left Flank, Left Abd  History of basal cell carcinoma (BCC) Right Lower Pretibia  Clear. Observe for recurrence. Call clinic for new or changing lesions.  Recommend regular skin exams, daily broad-spectrum spf 30+ sunscreen use, and photoprotection.     Erythema intertrigo Left Inframammary  Start Ketoconazole 2% Cream Apply to affected areas rash under breast twice a day until improved.  ketoconazole (NIZORAL) 2 % cream - Left Inframammary  Return as scheduled for TBSE.Lindi Adie, CMA, am acting as scribe for Brendolyn Patty, MD .  Documentation: I have reviewed the above documentation for accuracy and completeness, and I agree with the above.  Brendolyn Patty MD

## 2020-06-05 NOTE — Patient Instructions (Addendum)
Topicals to help with pain: Zostrix Cream, Topical Voltaren, [employer], [place], Aspercreme.   If you need something for pain relief you may take 1 extra strength Tylenol (acetaminophen) OR 2 Ibuprofen (200mg  each) and alternate every 4 hours as needed for pain. (do not take these if you are allergic to them or if you have a reason you should not take them.)    If taking ibuprofen, do not use topical Voltaren at the same time.

## 2020-06-07 ENCOUNTER — Other Ambulatory Visit: Payer: Self-pay | Admitting: Family Medicine

## 2020-06-07 DIAGNOSIS — Z1231 Encounter for screening mammogram for malignant neoplasm of breast: Secondary | ICD-10-CM

## 2020-06-18 ENCOUNTER — Telehealth: Payer: Self-pay

## 2020-06-18 DIAGNOSIS — B029 Zoster without complications: Secondary | ICD-10-CM

## 2020-06-18 MED ORDER — GABAPENTIN 300 MG PO CAPS: 300.0000 mg | ORAL_CAPSULE | Freq: Three times a day (TID) | ORAL | 2 refills | Status: DC

## 2020-06-18 NOTE — Telephone Encounter (Signed)
She can take that combo for pain but make sure she takes with food   The gabapentin should help-I sent it in   Vitamin D is good- keep taking it  B complex vitamin is helpful for energy   miralax- no limit/safe/ can take as long as she wants   Keep me posted

## 2020-06-18 NOTE — Telephone Encounter (Signed)
Pt said she has had shingles since July 2021. pts back is not hurting; pt is having a burning and pain from breasts to waist.pt saw Dr Nicole Kindred dermatologist 06/05/20; pt was advised by Dr Nicole Kindred to take one Tylenol extra strength and 2 ibuprofen 200 mg each for pain and pt thinks that helps the pain some but pt wants to know if OK to continue taking that combo of pain med. When pt saw Dr Nicole Kindred she was given gabapentin 300 mg taking one cap tid. Patient wants to know how Dr Glori Bickers would prescribe the gabapentin. Pt is tired with no energy and pt wants to know if Vit D complex will help nerve endings at all and will Vit D give pt more energy? Also pt has been taking miralax one capsule and eating raisin bran which has helped the constipation and pt wants to know how long can she take the miraxlax. [facility]. Pt request cb after DR Glori Bickers reviews this note.

## 2020-06-19 MED ORDER — GABAPENTIN 100 MG PO CAPS: 100.0000 mg | ORAL_CAPSULE | Freq: Three times a day (TID) | ORAL | 3 refills | Status: DC

## 2020-06-19 NOTE — Telephone Encounter (Signed)
Pt notified of Dr. Marliss Coots comments and that Rx was sent

## 2020-06-19 NOTE — Telephone Encounter (Signed)
I sent it  Keep [place] posted/hope it helps

## 2020-06-19 NOTE — Telephone Encounter (Signed)
Pt notified of Dr. Marliss Coots comments and verbalized understanding.   Pt did say there was a misunderstanding from her phone note. Pt already has the 300 mg gabapentin from Dr. Nicole Kindred but she read the side eff and is very nervous about taking it so pharmacist told her there is a 100 mg Rx. Pt was asking if Dr. Glori Bickers could send in the gabapentin 100 mg with sig: TID still so if she tries the lowest dose possible and she has side eff at least it's not the 300 mg. Pt said she would like to start with 100 mgs and if she takes that for a while with no side eff and she is still in pain she would feel more comfortable increasing it to the 300 mgs at that time.  If okay pt would like PCP to send in gabapentin 100mg  to [facility] [address] [address]

## 2020-07-09 DIAGNOSIS — Z961 Presence of intraocular lens: Secondary | ICD-10-CM | POA: Diagnosis not present

## 2020-07-09 DIAGNOSIS — H43813 Vitreous degeneration, bilateral: Secondary | ICD-10-CM | POA: Diagnosis not present

## 2020-07-09 DIAGNOSIS — H04123 Dry eye syndrome of bilateral lacrimal glands: Secondary | ICD-10-CM | POA: Diagnosis not present

## 2020-07-09 DIAGNOSIS — H524 Presbyopia: Secondary | ICD-10-CM | POA: Diagnosis not present

## 2020-07-22 ENCOUNTER — Telehealth: Payer: Self-pay | Admitting: Family Medicine

## 2020-07-22 DIAGNOSIS — R7309 Other abnormal glucose: Secondary | ICD-10-CM

## 2020-07-22 DIAGNOSIS — Z0000 Encounter for general adult medical examination without abnormal findings: Secondary | ICD-10-CM

## 2020-07-22 DIAGNOSIS — E7800 Pure hypercholesterolemia, unspecified: Secondary | ICD-10-CM

## 2020-07-22 DIAGNOSIS — M810 Age-related osteoporosis without current pathological fracture: Secondary | ICD-10-CM

## 2020-07-22 DIAGNOSIS — E559 Vitamin D deficiency, unspecified: Secondary | ICD-10-CM

## 2020-07-22 DIAGNOSIS — R5382 Chronic fatigue, unspecified: Secondary | ICD-10-CM

## 2020-07-22 NOTE — Telephone Encounter (Signed)
-----   Message from Cloyd Stagers, RT sent at 07/17/2020  9:44 AM EST ----- Regarding: Lab Orders for Monday 11.22.2021 Please place lab orders for Monday 11.22.2021, office visit for physical on Monday 11.29.2021 Thank you, Dyke Maes RT(R)

## 2020-07-23 ENCOUNTER — Other Ambulatory Visit: Payer: Self-pay

## 2020-07-23 ENCOUNTER — Other Ambulatory Visit (INDEPENDENT_AMBULATORY_CARE_PROVIDER_SITE_OTHER): Payer: Medicare Other

## 2020-07-23 DIAGNOSIS — E7800 Pure hypercholesterolemia, unspecified: Secondary | ICD-10-CM | POA: Diagnosis not present

## 2020-07-23 DIAGNOSIS — E559 Vitamin D deficiency, unspecified: Secondary | ICD-10-CM

## 2020-07-23 DIAGNOSIS — R7309 Other abnormal glucose: Secondary | ICD-10-CM | POA: Diagnosis not present

## 2020-07-23 DIAGNOSIS — R5382 Chronic fatigue, unspecified: Secondary | ICD-10-CM

## 2020-07-23 LAB — LIPID PANEL
Cholesterol: 205 mg/dL — ABNORMAL HIGH (ref 0–200)
HDL: 48 mg/dL (ref >39.00)
LDL Cholesterol: 130 mg/dL — ABNORMAL HIGH (ref 0–99)
NonHDL: 157.1
Total CHOL/HDL Ratio: 4
Triglycerides: 135 mg/dL (ref 0.0–149.0)
VLDL: 27 mg/dL (ref 0.0–40.0)

## 2020-07-23 LAB — COMPREHENSIVE METABOLIC PANEL
ALT: 12 U/L (ref 0–35)
AST: 15 U/L (ref 0–37)
Albumin: 4.3 g/dL (ref 3.5–5.2)
Alkaline Phosphatase: 43 U/L (ref 39–117)
BUN: 11 mg/dL (ref 6–23)
CO2: 23 mEq/L (ref 19–32)
Calcium: 9.2 mg/dL (ref 8.4–10.5)
Chloride: 107 mEq/L (ref 96–112)
Creatinine, Ser: 0.72 mg/dL (ref 0.40–1.20)
GFR: 81.23 mL/min (ref >60.00)
Glucose, Bld: 98 mg/dL (ref 70–99)
Potassium: 4.5 mEq/L (ref 3.5–5.1)
Sodium: 139 mEq/L (ref 135–145)
Total Bilirubin: 0.6 mg/dL (ref 0.2–1.2)
Total Protein: 6.9 g/dL (ref 6.0–8.3)

## 2020-07-23 LAB — CBC WITH DIFFERENTIAL/PLATELET
Basophils Absolute: 0 10*3/uL (ref 0.0–0.1)
Basophils Relative: 1 % (ref 0.0–3.0)
Eosinophils Absolute: 0.2 10*3/uL (ref 0.0–0.7)
Eosinophils Relative: 4.1 % (ref 0.0–5.0)
HCT: 40.3 % (ref 36.0–46.0)
Hemoglobin: 13.7 g/dL (ref 12.0–15.0)
Lymphocytes Relative: 38.5 % (ref 12.0–46.0)
Lymphs Abs: 1.8 10*3/uL (ref 0.7–4.0)
MCHC: 33.9 g/dL (ref 30.0–36.0)
MCV: 92.3 fl (ref 78.0–100.0)
Monocytes Absolute: 0.6 10*3/uL (ref 0.1–1.0)
Monocytes Relative: 12.8 % — ABNORMAL HIGH (ref 3.0–12.0)
Neutro Abs: 2 10*3/uL (ref 1.4–7.7)
Neutrophils Relative %: 43.6 % (ref 43.0–77.0)
Platelets: 203 10*3/uL (ref 150.0–400.0)
RBC: 4.36 Mil/uL (ref 3.87–5.11)
RDW: 14.1 % (ref 11.5–15.5)
WBC: 4.6 10*3/uL (ref 4.0–10.5)

## 2020-07-23 LAB — HEMOGLOBIN A1C: Hgb A1c MFr Bld: 5.5 % (ref 4.6–6.5)

## 2020-07-23 LAB — TSH: TSH: 2.37 u[IU]/mL (ref 0.35–4.50)

## 2020-07-23 LAB — VITAMIN D 25 HYDROXY (VIT D DEFICIENCY, FRACTURES): VITD: 43.79 ng/mL (ref 30.00–100.00)

## 2020-07-30 ENCOUNTER — Encounter: Payer: Self-pay | Admitting: Family Medicine

## 2020-07-30 ENCOUNTER — Ambulatory Visit (INDEPENDENT_AMBULATORY_CARE_PROVIDER_SITE_OTHER): Payer: Medicare Other | Admitting: Family Medicine

## 2020-07-30 ENCOUNTER — Other Ambulatory Visit: Payer: Self-pay

## 2020-07-30 ENCOUNTER — Ambulatory Visit: Payer: Medicare Other

## 2020-07-30 VITALS — BP 133/80 | HR 80 | Temp 96.9°F | Ht 62.25 in | Wt 175.2 lb

## 2020-07-30 DIAGNOSIS — Z6831 Body mass index (BMI) 31.0-31.9, adult: Secondary | ICD-10-CM

## 2020-07-30 DIAGNOSIS — E559 Vitamin D deficiency, unspecified: Secondary | ICD-10-CM

## 2020-07-30 DIAGNOSIS — E6609 Other obesity due to excess calories: Secondary | ICD-10-CM

## 2020-07-30 DIAGNOSIS — E7800 Pure hypercholesterolemia, unspecified: Secondary | ICD-10-CM

## 2020-07-30 DIAGNOSIS — Z23 Encounter for immunization: Secondary | ICD-10-CM

## 2020-07-30 DIAGNOSIS — R7309 Other abnormal glucose: Secondary | ICD-10-CM

## 2020-07-30 DIAGNOSIS — M810 Age-related osteoporosis without current pathological fracture: Secondary | ICD-10-CM | POA: Diagnosis not present

## 2020-07-30 DIAGNOSIS — Z0000 Encounter for general adult medical examination without abnormal findings: Secondary | ICD-10-CM

## 2020-07-30 DIAGNOSIS — J4520 Mild intermittent asthma, uncomplicated: Secondary | ICD-10-CM

## 2020-07-30 NOTE — Patient Instructions (Addendum)
Flu shot today   For cholesterol  Avoid red meat/ fried foods/ egg yolks/ fatty breakfast meats/ butter, cheese and high fat dairy/ and shellfish    Take care of yourself   Once your pain improves -get back to exercise gradually

## 2020-07-30 NOTE — Assessment & Plan Note (Signed)
No longer sees Dr Donneta Romberg - we will take over refills if needed No longer getting allergy medicines

## 2020-07-30 NOTE — Assessment & Plan Note (Signed)
Reviewed health habits including diet and exercise and skin cancer prevention Reviewed appropriate screening tests for age  Also reviewed health mt list, fam hx and immunization status , as well as social and family history   Unfortunately much has been put on hold with her post herpetic neuralgia Labs reviewed  cologuard is utd Planning mammogram at the end of dec if she can tolerate it  Flu shot given  Did not get shingrix  Has not had covid imm (thinks she had covid but not sure) No falls or fractures (stopping the Prolia because she wonders if it caused her shingles) Taking vit D and level is nl  utd adv directive No cognitive concerns (gabapentin made her sleepy)  Stable hearing screen and utd vision/eye exam

## 2020-07-30 NOTE — Assessment & Plan Note (Signed)
Discussed how this problem influences overall health and the risks it imposes  Reviewed plan for weight loss with lower calorie diet (via better food choices and also portion control or program like weight watchers) and exercise building up to or more than 30 minutes 5 days per week including some aerobic activity   Pt is eating less and better (less appetite with neuralgia)

## 2020-07-30 NOTE — Assessment & Plan Note (Signed)
Lab Results  Component Value Date   HGBA1C 5.5 07/23/2020   Diet is better disc imp of low glycemic diet and wt loss to prevent DM2
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
## 2020-07-30 NOTE — Assessment & Plan Note (Signed)
Disc goals for lipids and reasons to control them Rev last labs with pt Rev low sat fat diet in detail Despite good diet- LDL is up to 130  Will continue to follow  It has been higher in the past

## 2020-07-30 NOTE — Assessment & Plan Note (Signed)
Sees Dr Gabriel Carina (endocrinology)  Pt is convinced that the prolia shot caused her shingles and does not want to take another She will disc further with Dr Wilburn Cornelia consider another medicine  No falls/fx   Is very careful  Vit D level is in the nl range Limited exercise-hopes to get better as her neuralgia improves

## 2020-07-30 NOTE — Assessment & Plan Note (Signed)
Vitamin D level is therapeutic with current supplementation Disc importance of this to bone and overall health Level os 43.7

## 2020-08-01 ENCOUNTER — Telehealth: Payer: Self-pay | Admitting: *Deleted

## 2020-08-01 NOTE — Telephone Encounter (Signed)
Thanks for letting me know  A cleaning should be fine   (unless it is too uncomfortable for her to sit through it)

## 2020-08-01 NOTE — Telephone Encounter (Signed)
Patient left a voicemail stating that she has an appointment scheduled next Thursday for a dental cleaning. Patient stated that she was told to call and check with her PCP to see if it is okay for her to keep that appointment since she has recently had shingles and takes the prolia shot. Patient requested a call back to let her know if she should keep this dental appointment so that she can let her dentist office know.

## 2020-08-02 NOTE — Telephone Encounter (Signed)
Pt.notified

## 2020-08-07 ENCOUNTER — Ambulatory Visit: Payer: Medicare Other | Admitting: Dermatology

## 2020-08-13 DIAGNOSIS — Z789 Other specified health status: Secondary | ICD-10-CM | POA: Diagnosis not present

## 2020-08-13 DIAGNOSIS — F32A Depression, unspecified: Secondary | ICD-10-CM | POA: Diagnosis not present

## 2020-08-13 DIAGNOSIS — B0229 Other postherpetic nervous system involvement: Secondary | ICD-10-CM | POA: Diagnosis not present

## 2020-08-13 DIAGNOSIS — M810 Age-related osteoporosis without current pathological fracture: Secondary | ICD-10-CM | POA: Diagnosis not present

## 2020-08-28 ENCOUNTER — Other Ambulatory Visit: Payer: Self-pay

## 2020-08-28 ENCOUNTER — Ambulatory Visit
Admission: RE | Admit: 2020-08-28 | Discharge: 2020-08-28 | Disposition: A | Discharge status: Home / Self Care | Payer: Medicare Other | Source: Ambulatory Visit | Attending: Family Medicine | Admitting: Family Medicine

## 2020-08-28 DIAGNOSIS — Z1231 Encounter for screening mammogram for malignant neoplasm of breast: Secondary | ICD-10-CM | POA: Diagnosis not present

## 2020-09-09 ENCOUNTER — Other Ambulatory Visit: Payer: Self-pay | Admitting: Family Medicine

## 2020-09-26 ENCOUNTER — Telehealth: Payer: Self-pay | Admitting: Family Medicine

## 2020-09-26 MED ORDER — ADVAIR DISKUS 100-50 MCG/DOSE IN AEPB: 1.0000 | INHALATION_SPRAY | Freq: Two times a day (BID) | RESPIRATORY_TRACT | 11 refills | Status: DC

## 2020-09-26 NOTE — Telephone Encounter (Signed)
Requesting a refill of Advair. NO further refills from Dr Donneta Romberg - he will not be prescribing further for the patient  Last refilled 09/13/20, #30 day supply  Advair 100-50, no generic   [facility]  Please advise, thanks.

## 2020-09-26 NOTE — Telephone Encounter (Signed)
Pharmacy requests refill on: Advair  LAST REFILL: 09/13/20 from Dr. Donneta Romberg LAST OV:  07/30/20, AWV NEXT OV: 07/30/21 [facility]ACY: [facility]

## 2020-10-16 ENCOUNTER — Other Ambulatory Visit: Payer: Self-pay

## 2020-10-16 ENCOUNTER — Encounter: Payer: Self-pay | Admitting: Dermatology

## 2020-10-16 ENCOUNTER — Ambulatory Visit (INDEPENDENT_AMBULATORY_CARE_PROVIDER_SITE_OTHER): Payer: Medicare Other | Admitting: Dermatology

## 2020-10-16 DIAGNOSIS — L304 Erythema intertrigo: Secondary | ICD-10-CM

## 2020-10-16 DIAGNOSIS — Z85828 Personal history of other malignant neoplasm of skin: Secondary | ICD-10-CM | POA: Diagnosis not present

## 2020-10-16 DIAGNOSIS — L578 Other skin changes due to chronic exposure to nonionizing radiation: Secondary | ICD-10-CM

## 2020-10-16 DIAGNOSIS — L570 Actinic keratosis: Secondary | ICD-10-CM

## 2020-10-16 DIAGNOSIS — B0229 Other postherpetic nervous system involvement: Secondary | ICD-10-CM

## 2020-10-16 DIAGNOSIS — L820 Inflamed seborrheic keratosis: Secondary | ICD-10-CM | POA: Diagnosis not present

## 2020-10-16 DIAGNOSIS — L219 Seborrheic dermatitis, unspecified: Secondary | ICD-10-CM | POA: Diagnosis not present

## 2020-10-16 DIAGNOSIS — Z1283 Encounter for screening for malignant neoplasm of skin: Secondary | ICD-10-CM | POA: Diagnosis not present

## 2020-10-16 DIAGNOSIS — D2271 Melanocytic nevi of right lower limb, including hip: Secondary | ICD-10-CM

## 2020-10-16 DIAGNOSIS — L814 Other melanin hyperpigmentation: Secondary | ICD-10-CM

## 2020-10-16 DIAGNOSIS — D229 Melanocytic nevi, unspecified: Secondary | ICD-10-CM

## 2020-10-16 DIAGNOSIS — L738 Other specified follicular disorders: Secondary | ICD-10-CM

## 2020-10-16 DIAGNOSIS — I8310 Varicose veins of unspecified lower extremity with inflammation: Secondary | ICD-10-CM

## 2020-10-16 DIAGNOSIS — L819 Disorder of pigmentation, unspecified: Secondary | ICD-10-CM

## 2020-10-16 DIAGNOSIS — L821 Other seborrheic keratosis: Secondary | ICD-10-CM

## 2020-10-16 MED ORDER — FLUOCINOLONE ACETONIDE BODY 0.01 % EX OIL
TOPICAL_OIL | CUTANEOUS | 1 refills | Status: DC
Start: 2020-10-16 — End: 2022-09-24

## 2020-10-16 NOTE — Progress Notes (Signed)
Follow-Up Visit   Subjective  Jody Avery is a 75 y.o. female who presents for the following: TBSE.  She has a history of BCC of the right lower pretibia and SCC in situ of the left dorsal hand. She has a rough spot on her right calf that peels, as well as a couple other irritated spots on chest and leg. She had shingles in July 2021 and still suffers from pain at times. Area is still very sensitive. She has a discoloration on her left lower neck where she was itchy from a rash.  She does not want to get the Covid vaccine, since shingles reactivation is a documented side effect.  She has not has the shingles vaccine.  The following portions of the chart were reviewed this encounter and updated as appropriate:       Review of Systems:  No other skin or systemic complaints except as noted in HPI or Assessment and Plan.  Objective  Well appearing patient in no apparent distress; mood and affect are within normal limits.  A full examination was performed including scalp, head, eyes, ears, nose, lips, neck, chest, axillae, abdomen, back, buttocks, bilateral upper extremities, bilateral lower extremities, hands, feet, fingers, toes, fingernails, and toenails. All findings within normal limits unless otherwise noted below.  Objective  L upper abdomen: Skin clear.  Objective  Right Lower Pretibia: Well healed scar with no evidence of recurrence.   Objective  ears: Mild erythema.  Itching per pt  Objective  L lower neck: Light violaceous patch.  Objective  Right 3rd Toe: 4.29mm brown macule  Objective  R medial calf x 1, L chest x 1, R pretibia x 1 (3): Erythematous keratotic or waxy stuck-on papule or plaque.   Objective  upper lip x 1, L nasal dorsum x 1, L temple x 2 (4): Erythematous thin papules/macules with gritty scale.   Objective  Right Inframammary: Mild erythema.   Assessment & Plan   Skin cancer screening performed today.  Actinic Damage -  chronic, secondary to cumulative UV radiation exposure/sun exposure over time - diffuse scaly erythematous macules with underlying dyspigmentation - Recommend daily broad spectrum sunscreen SPF 30+ to sun-exposed areas, reapply every 2 hours as needed.  - Call for new or changing lesions.  Lentigines - Scattered tan macules - Discussed due to sun exposure - Benign, observe - Recommend daily broad spectrum sunscreen SPF 30+ to sun-exposed areas, reapply every 2 hours as needed. - Call for any changes  Seborrheic Keratoses - Stuck-on, waxy, tan-brown papules and plaques of the chest, legs - Discussed benign etiology and prognosis. - Observe - Call for any changes  History of Squamous Cell Carcinoma in Situ of the Skin - No evidence of recurrence today of the left dorsal hand - Recommend regular full body skin exams - Recommend daily broad spectrum sunscreen SPF 30+ to sun-exposed areas, reapply every 2 hours as needed.  - Call if any new or changing lesions are noted between office visits  Sebaceous Hyperplasia - Small yellow papules with a central dell - Benign - Observe  Spider Veins - Dilated blue, purple or red veins at the lower extremities - Reassured - These can be treated by sclerotherapy (a procedure to inject a medicine into the veins to make them disappear) if desired, but the treatment is not covered by insurance   Post herpetic neuralgia L upper abdomen  Chronic condition, some improvement Pt is now off of Gabapentin Discussed with patient the pain  from shingles can take time to improve.     History of basal cell carcinoma (BCC) Right Lower Pretibia  Clear. Observe for recurrence. Call clinic for new or changing lesions.  Recommend regular skin exams, daily broad-spectrum spf 30+ sunscreen use, and photoprotection.     Seborrheic dermatitis ears  Start fluocinolone oil Apply 1-2  gtts to ears qd/bid prn itch/scale.  Seborrheic Dermatitis  -  is a  chronic persistent rash characterized by pinkness and scaling most commonly of the mid face but also can occur on the scalp (dandruff), ears; mid chest and mid back. It tends to be exacerbated by stress and cooler weather.  People who have neurologic disease may experience new onset or exacerbation of existing seborrheic dermatitis.  The condition is not curable but treatable and can be controlled.   Fluocinolone Acetonide Body 0.01 % OIL - ears  Post-inflammatory pigmentary changes L lower neck  Secondary to Intertrigo  Recommend CeraVe Anti-itch lotion as needed, sample given. May try ketoconazole 2% cream to AA neck qd/bid.  Nevus Right 3rd Toe  Benign-appearing.  Observation.  Call clinic for new or changing moles.  Recommend daily use of broad spectrum spf 30+ sunscreen to sun-exposed areas.    Inflamed seborrheic keratosis (3) R medial calf x 1, L chest x 1, R pretibia x 1  Destruction of lesion - R medial calf x 1, L chest x 1, R pretibia x 1  Destruction method: cryotherapy   Informed consent: discussed and consent obtained   Lesion destroyed using liquid nitrogen: Yes   Region frozen until ice ball extended beyond lesion: Yes   Outcome: patient tolerated procedure well with no complications   Post-procedure details: wound care instructions given    Actinic keratosis (4) upper lip x 1, L nasal dorsum x 1, L temple x 2  Destruction of lesion - upper lip x 1, L nasal dorsum x 1, L temple x 2  Destruction method: cryotherapy   Informed consent: discussed and consent obtained   Lesion destroyed using liquid nitrogen: Yes   Region frozen until ice ball extended beyond lesion: Yes   Outcome: patient tolerated procedure well with no complications   Post-procedure details: wound care instructions given    Erythema intertrigo Right Inframammary  Improved, may recur Continue ketoconazole 2% cream qd/bid prn flares or qd after shower for maintenance.  Intertrigo is a  chronic recurrent rash that occurs in skin fold areas that may be associated with friction; heat; moisture; yeast; fungus; and bacteria.  It is exacerbated by increased movement / activity; sweating; and higher atmospheric temperature.   Other Related Medications ketoconazole (NIZORAL) 2 % cream  Return in about 6 months (around 04/15/2021) for AKs.   IJamesetta Orleans, CMA, am acting as scribe for Brendolyn Patty, MD .  Documentation: I have reviewed the above documentation for accuracy and completeness, and I agree with the above.  Brendolyn Patty MD

## 2020-10-16 NOTE — Patient Instructions (Addendum)
Start ketoconazole 2% cream (cream you use on rash under breasts) Apply to affected area on left lower neck 1-2 times a day until improved. Start CeraVe Anti-itch lotion to lower neck as needed for symptoms.  Cryotherapy Aftercare  . Wash gently with soap and water everyday.   [name] Apply Vaseline and Band-Aid daily until healed.

## 2020-10-29 ENCOUNTER — Telehealth: Payer: Self-pay

## 2020-10-29 NOTE — Telephone Encounter (Signed)
Spoke with pt about concerns she is having about the spots on her legs that you froze some ISKs at her last visit approx 2 weeks ago. She states that she has been using vasaline and keeping them covered but that she has noticed that the areas around and approx 0.5-1 in out are red.  She does not feel like the areas are warm to touch. She states she can see white in the center of the spots. She is mainly concerned that they may not be healing as other spots that have been frozen and biopsied on the legs have not looked like this in the past. Pt is going to try and send pictures through mychart but isn't sure she will be able to figure it out.

## 2020-10-29 NOTE — Telephone Encounter (Signed)
Pt would like to come in Wednesday. States that she will call and cancel if she feels it is getting better.

## 2020-10-29 NOTE — Telephone Encounter (Signed)
If she wants to come in tomorrow or Wed, I can look at them.  Spots on the legs tend to heal much slower than other places on the body.

## 2020-10-31 ENCOUNTER — Ambulatory Visit: Payer: Medicare Other | Admitting: Dermatology

## 2021-04-16 ENCOUNTER — Other Ambulatory Visit: Payer: Self-pay

## 2021-04-16 ENCOUNTER — Ambulatory Visit (INDEPENDENT_AMBULATORY_CARE_PROVIDER_SITE_OTHER): Payer: Medicare Other | Admitting: Dermatology

## 2021-04-16 DIAGNOSIS — L814 Other melanin hyperpigmentation: Secondary | ICD-10-CM

## 2021-04-16 DIAGNOSIS — L720 Epidermal cyst: Secondary | ICD-10-CM

## 2021-04-16 DIAGNOSIS — L578 Other skin changes due to chronic exposure to nonionizing radiation: Secondary | ICD-10-CM

## 2021-04-16 DIAGNOSIS — I781 Nevus, non-neoplastic: Secondary | ICD-10-CM

## 2021-04-16 DIAGNOSIS — L820 Inflamed seborrheic keratosis: Secondary | ICD-10-CM

## 2021-04-16 DIAGNOSIS — L738 Other specified follicular disorders: Secondary | ICD-10-CM | POA: Diagnosis not present

## 2021-04-16 DIAGNOSIS — L570 Actinic keratosis: Secondary | ICD-10-CM | POA: Diagnosis not present

## 2021-04-16 DIAGNOSIS — L821 Other seborrheic keratosis: Secondary | ICD-10-CM | POA: Diagnosis not present

## 2021-04-16 DIAGNOSIS — Z86007 Personal history of in-situ neoplasm of skin: Secondary | ICD-10-CM

## 2021-04-16 DIAGNOSIS — Z85828 Personal history of other malignant neoplasm of skin: Secondary | ICD-10-CM

## 2021-04-16 NOTE — Progress Notes (Signed)
Follow-Up Visit   Subjective  Jody Avery is a 75 y.o. female who presents for the following: Follow-up (6 month follow up on aks. Patient reports she has a  rough spot at right forearm. Patient also reports a spot at left wrist area that she would like checked. Patient reports at red scaly spot at right posterior ankle. Patient reports some itchy spots at right lower leg. ). Spot on R arm is tender to touch.  She also has some bumps on her face that are sore.   The following portions of the chart were reviewed this encounter and updated as appropriate:       Objective  Well appearing patient in no apparent distress; mood and affect are within normal limits.  A focused examination was performed including face, bilateral arms, bilateral legs, nose, right posterior ankle. Relevant physical exam findings are noted in the Assessment and Plan.  left posterior wrist x 1, right posterior ankle x 1 (2) Erythematous keratotic or waxy stuck-on papule  right mid forearm x 1 Keratotic macule, tender to touch   right and left nasolabial fold, 2 total (2) Smooth white papule(s).    Assessment & Plan  Inflamed seborrheic keratosis left posterior wrist x 1, right posterior ankle x 1  Destruction of lesion - left posterior wrist x 1, right posterior ankle x 1  Destruction method: cryotherapy   Informed consent: discussed and consent obtained   Lesion destroyed using liquid nitrogen: Yes   Region frozen until ice ball extended beyond lesion: Yes   Outcome: patient tolerated procedure well with no complications   Post-procedure details: wound care instructions given   Additional details:  Prior to procedure, discussed risks of blister formation, small wound, skin dyspigmentation, or rare scar following cryotherapy. Recommend Vaseline ointment to treated areas while healing.   AK (actinic keratosis) right mid forearm x 1  Hypertrophic  Actinic keratoses are precancerous spots that  appear secondary to cumulative UV radiation exposure/sun exposure over time. They are chronic with expected duration over 1 year. A portion of actinic keratoses will progress to squamous cell carcinoma of the skin. It is not possible to reliably predict which spots will progress to skin cancer and so treatment is recommended to prevent development of skin cancer.  Recommend daily broad spectrum sunscreen SPF 30+ to sun-exposed areas, reapply every 2 hours as needed.  Recommend staying in the shade or wearing long sleeves, sun glasses (UVA+UVB protection) and wide brim hats (4-inch brim around the entire circumference of the hat). Call for new or changing lesions.   Destruction of lesion - right mid forearm x 1  Destruction method: cryotherapy   Informed consent: discussed and consent obtained   Lesion destroyed using liquid nitrogen: Yes   Region frozen until ice ball extended beyond lesion: Yes   Outcome: patient tolerated procedure well with no complications   Post-procedure details: wound care instructions given   Additional details:  Prior to procedure, discussed risks of blister formation, small wound, skin dyspigmentation, or rare scar following cryotherapy. Recommend Vaseline ointment to treated areas while healing.   Milia (2) right and left nasolabial fold, 2 total  Symptomatic  Acne/Milia surgery - right and left nasolabial fold, 2 total Procedure risks and benefits were discussed with the patient and verbal consent was obtained. Following prep of the skin on the right and left nasolabial fold  with an alcohol swab and local anesthesia injection, extraction of milia was performed with a comedone extractor following  superficial incision made over their surfaces with a #11 surgical blade. Capillary hemostasis was achieved with 20% aluminum chloride solution. Vaseline ointment was applied to each site. The patient tolerated the procedure well.  Seborrheic Keratoses - Stuck-on, waxy,  tan-brown papules and/or plaques  - Benign-appearing - Discussed benign etiology and prognosis. - Observe - Call for any changes  Telangiectasia - 3 mm blanching pink macule left central upper vermillion lip  - Benign appearing on exam - Call for changes  Sebaceous Hyperplasia - Small yellow papules with a central dell at forehead  - Benign - Observe  Lentigines - Scattered tan macules - Due to sun exposure - Benign-appering, observe - Recommend daily broad spectrum sunscreen SPF 30+ to sun-exposed areas, reapply every 2 hours as needed. - Call for any changes  Actinic Damage - chronic, secondary to cumulative UV radiation exposure/sun exposure over time - diffuse scaly erythematous macules with underlying dyspigmentation - Recommend daily broad spectrum sunscreen SPF 30+ to sun-exposed areas, reapply every 2 hours as needed.  - Recommend staying in the shade or wearing long sleeves, sun glasses (UVA+UVB protection) and wide brim hats (4-inch brim around the entire circumference of the hat). - Call for new or changing lesions.  History of Basal Cell Carcinoma of the Skin - No evidence of recurrence today at right lower pretibia (01/2020) - Recommend regular full body skin exams - Recommend daily broad spectrum sunscreen SPF 30+ to sun-exposed areas, reapply every 2 hours as needed.  - Call if any new or changing lesions are noted between office visits  History of Squamous Cell Carcinoma in Situ of the Skin - No evidence of recurrence today left dorsal hand (2014) - Recommend regular full body skin exams - Recommend daily broad spectrum sunscreen SPF 30+ to sun-exposed areas, reapply every 2 hours as needed.  - Call if any new or changing lesions are noted between office visits   Return in about 6 months (around 10/17/2021) for TBSE. I, Ruthell Rummage, CMA, am acting as scribe for Brendolyn Patty, MD.  Documentation: I have reviewed the above documentation for accuracy and  completeness, and I agree with the above.  Brendolyn Patty MD

## 2021-04-16 NOTE — Patient Instructions (Addendum)
Actinic keratoses are precancerous spots that appear secondary to cumulative UV radiation exposure/sun exposure over time. They are chronic with expected duration over 1 year. A portion of actinic keratoses will progress to squamous cell carcinoma of the skin. It is not possible to reliably predict which spots will progress to skin cancer and so treatment is recommended to prevent development of skin cancer.  Recommend daily broad spectrum sunscreen SPF 30+ to sun-exposed areas, reapply every 2 hours as needed.  Recommend staying in the shade or wearing long sleeves, sun glasses (UVA+UVB protection) and wide brim hats (4-inch brim around the entire circumference of the hat). Call for new or changing lesions.   Cryotherapy Aftercare  Wash gently with soap and water everyday.   Apply Vaseline and Band-Aid daily until healed.   Seborrheic Keratosis  What causes seborrheic keratoses? Seborrheic keratoses are harmless, common skin growths that first appear during adult life.  As time goes by, more growths appear.  Some people may develop a large number of them.  Seborrheic keratoses appear on both covered and uncovered body parts.  They are not caused by sunlight.  The tendency to develop seborrheic keratoses can be inherited.  They vary in color from skin-colored to gray, brown, or even black.  They can be either smooth or have a rough, warty surface.   Seborrheic keratoses are superficial and look as if they were stuck on the skin.  Under the microscope this type of keratosis looks like layers upon layers of skin.  That is why at times the top layer may seem to fall off, but the rest of the growth remains and re-grows.    Treatment Seborrheic keratoses do not need to be treated, but can easily be removed in the office.  Seborrheic keratoses often cause symptoms when they rub on clothing or jewelry.  Lesions can be in the way of shaving.  If they become inflamed, they can cause itching, soreness, or  burning.  Removal of a seborrheic keratosis can be accomplished by freezing, burning, or surgery. If any spot bleeds, scabs, or grows rapidly, please return to have it checked, as these can be an indication of a skin cancer. d  If you have any questions or concerns for your doctor, please call our main line at [phone] and press option 4 to reach your doctor's medical assistant. If no one answers, please leave a voicemail as directed and we will return your call as soon as possible. Messages left after 4 pm will be answered the following business day.   You may also send [place] a message via [facility]. We typically respond to MyChart messages within 1-2 business days.  For prescription refills, please ask your pharmacy to contact our office. Our fax number is [phone].  If you have an urgent issue when the clinic is closed that cannot wait until the next business day, you can page your doctor at the number below.    Please note that while we do our best to be available for urgent issues outside of office hours, we are not available 24/7.   If you have an urgent issue and are unable to reach [place], you may choose to seek medical care at your doctor's office, retail clinic, urgent care center, or emergency room.  If you have a medical emergency, please immediately call 911 or go to the emergency department.  Pager Numbers  - Dr. Nehemiah Massed: [phone]  - Dr. Laurence Ferrari: [phone]  - Dr. Nicole Kindred: [phone]  In the  event of inclement weather, please call our main line at [phone] for an update on the status of any delays or closures.  Dermatology Medication Tips: Please keep the boxes that topical medications come in in order to help keep track of the instructions about where and how to use these. Pharmacies typically print the medication instructions only on the boxes and not directly on the medication tubes.   If your medication is too expensive, please contact our office at  [phone] option 4 or send us a message through MyChart.   We are unable to tell what your co-pay for medications will be in advance as this is different depending on your insurance coverage. However, we may be able to find a substitute medication at lower cost or fill out paperwork to get insurance to cover a needed medication.   If a prior authorization is required to get your medication covered by your insurance company, please allow us 1-2 business days to complete this process.  Drug prices often vary depending on where the prescription is filled and some pharmacies may offer cheaper prices.  The website www.goodrx.com contains coupons for medications through different pharmacies. The prices here do not account for what the cost may be with help from insurance (it may be cheaper with your insurance), but the website can give you the price if you did not use any insurance.  - You can print the associated coupon and take it with your prescription to the pharmacy.  - You may also stop by our office during regular business hours and pick up a GoodRx coupon card.  - If you need your prescription sent electronically to a different pharmacy, notify our office through [facility] MyChart or by phone at [phone] option 4.  

## 2021-05-08 IMAGING — MG DIGITAL SCREENING BILAT W/ TOMO W/ CAD
8 series · 8 of 24 positions shown · non-contrast
Comparison: Previous exam(s).

CLINICAL DATA: Screening.

EXAM:
DIGITAL SCREENING BILATERAL MAMMOGRAM WITH TOMO AND CAD

[L MLO synth-2D]
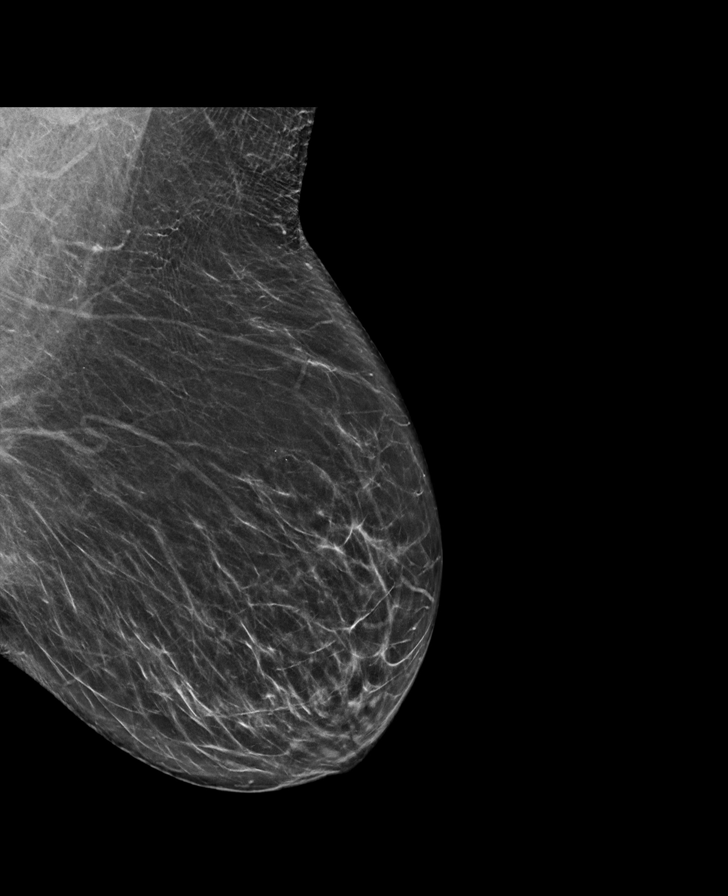

[R CC synth-2D]
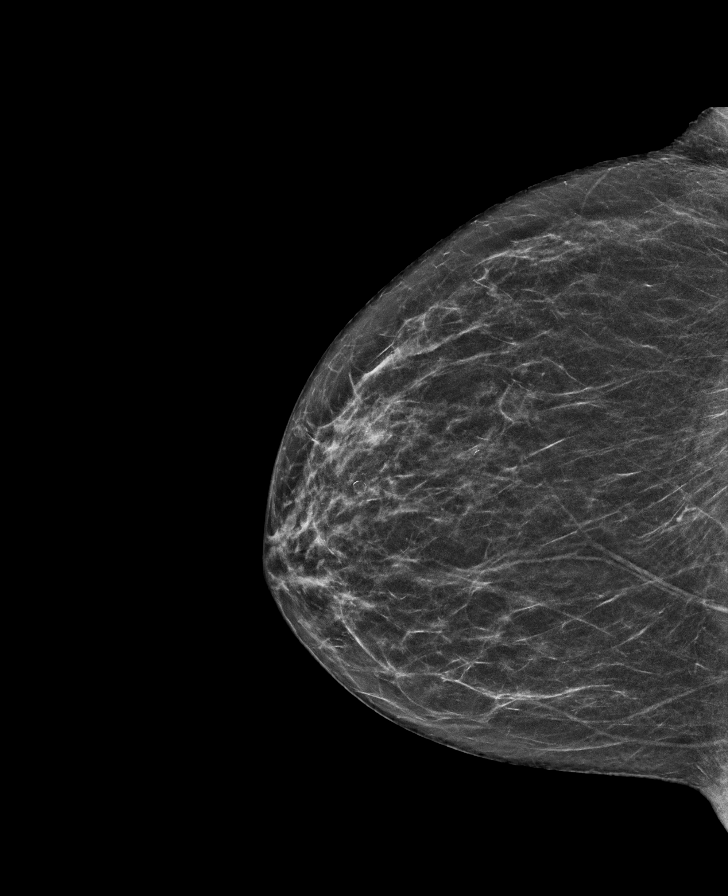

[R MLO synth-2D]
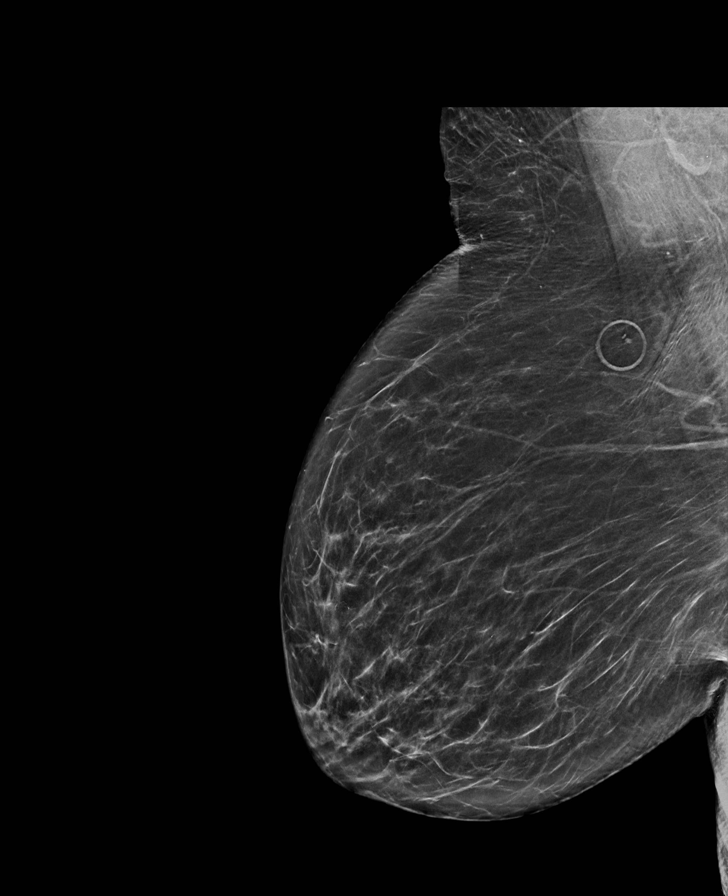

[L CC synth-2D]
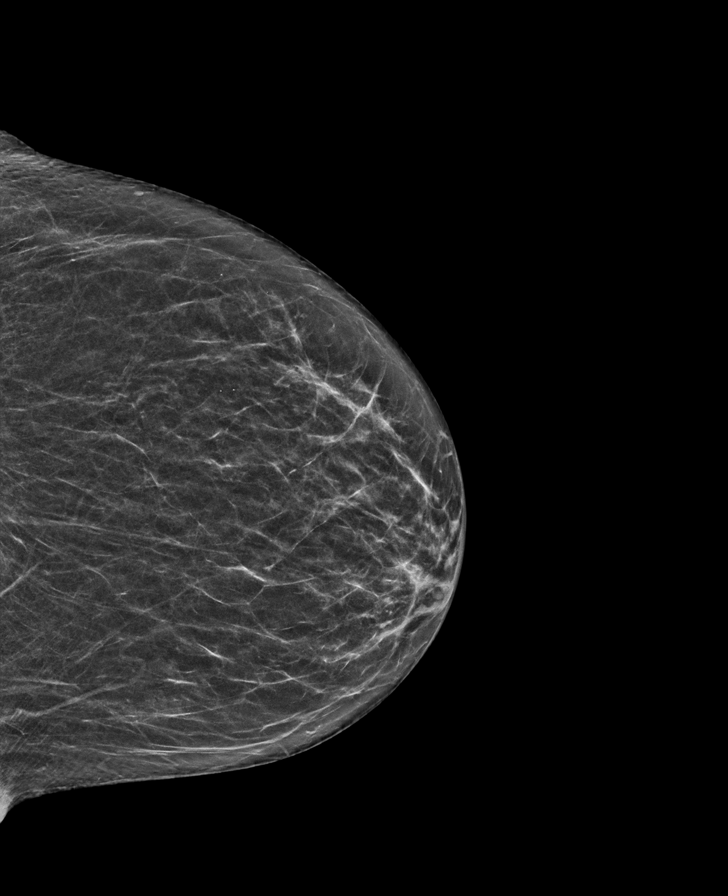

[L MLO tomo · tomo slice 37/73.0]
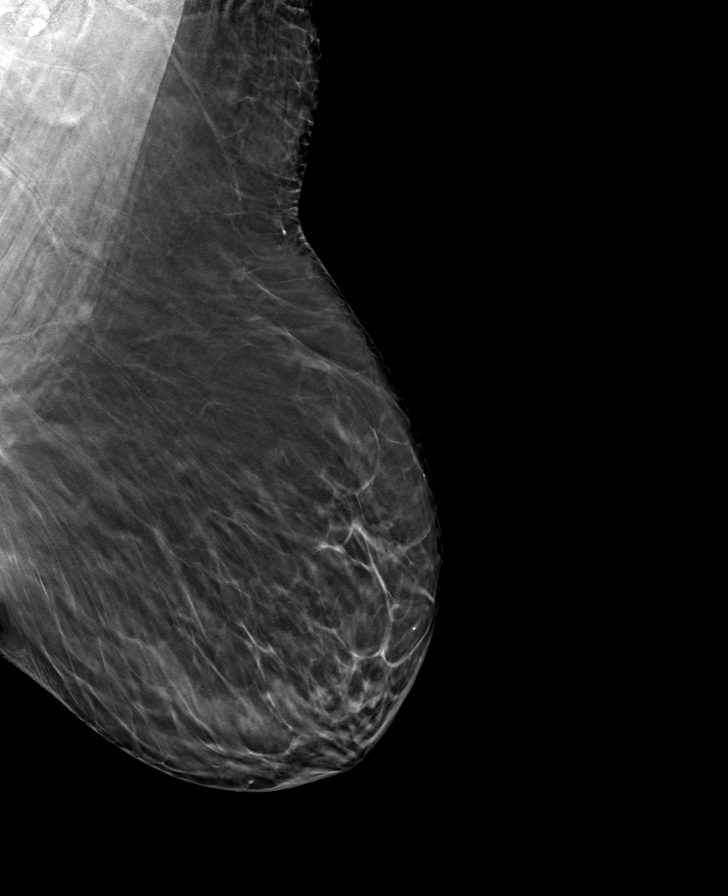

[R MLO tomo · tomo slice 36/71.0]
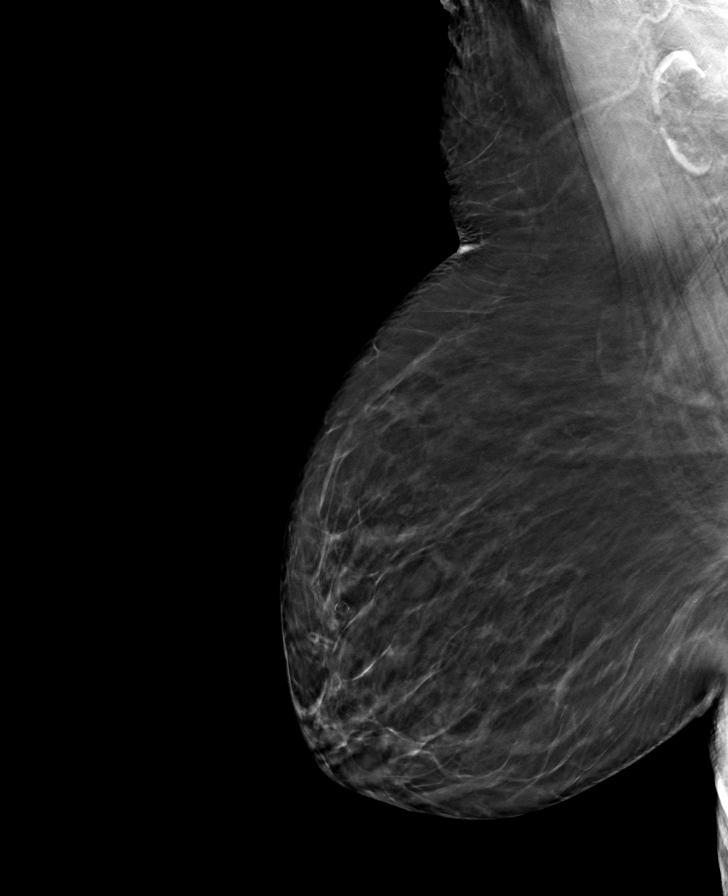

[L CC tomo · tomo slice 29/56.0]
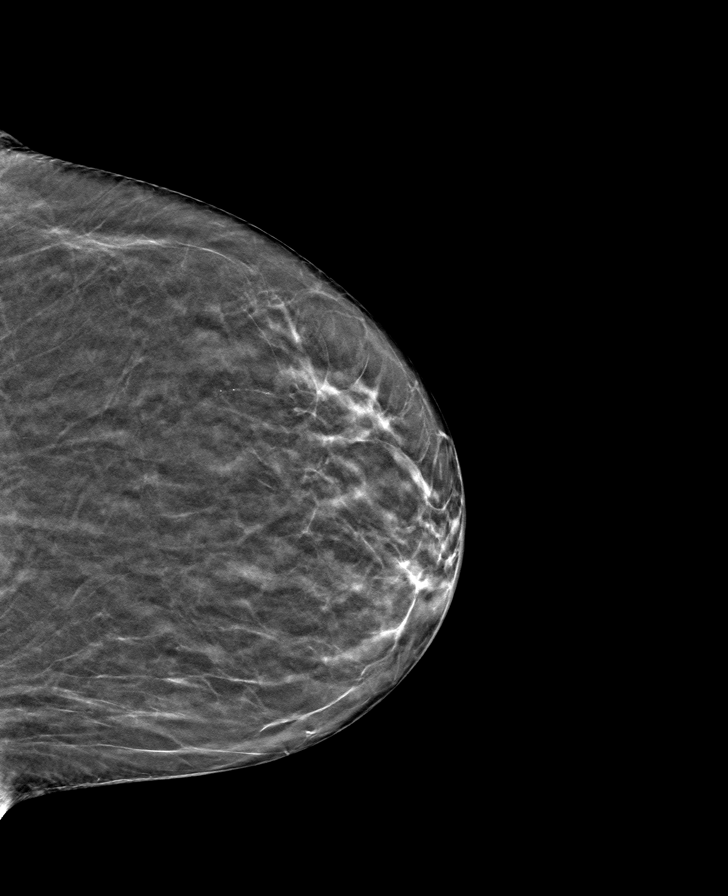

[R CC tomo · tomo slice 27/54.0]
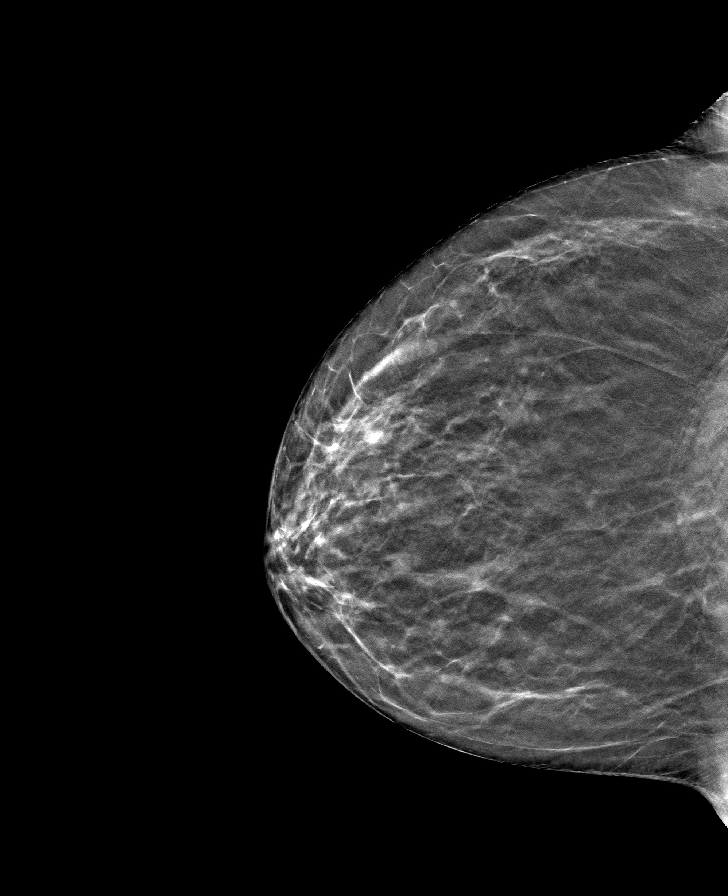

[8 of 24 positions shown; findings below may reference images not displayed]

ACR Breast Density Category b: There are scattered areas of
fibroglandular density.
FINDINGS: There are no findings suspicious for malignancy. Images were
processed with CAD.
IMPRESSION: No mammographic evidence of malignancy. A result letter of this
screening mammogram will be mailed directly to the patient.

RECOMMENDATION:
Screening mammogram in one year. (Code:[id number])

BI-RADS CATEGORY  1: Negative.

## 2021-07-22 ENCOUNTER — Other Ambulatory Visit: Payer: Self-pay | Admitting: Family Medicine

## 2021-07-22 ENCOUNTER — Other Ambulatory Visit: Payer: Self-pay | Admitting: Internal Medicine

## 2021-07-22 DIAGNOSIS — Z1231 Encounter for screening mammogram for malignant neoplasm of breast: Secondary | ICD-10-CM

## 2021-07-30 ENCOUNTER — Ambulatory Visit: Payer: Medicare Other | Admitting: Family Medicine

## 2021-08-29 ENCOUNTER — Ambulatory Visit
Admission: RE | Admit: 2021-08-29 | Discharge: 2021-08-29 | Disposition: A | Discharge status: Home / Self Care | Payer: Medicare Other | Source: Ambulatory Visit | Attending: Internal Medicine | Admitting: Internal Medicine

## 2021-08-29 DIAGNOSIS — Z1231 Encounter for screening mammogram for malignant neoplasm of breast: Secondary | ICD-10-CM

## 2021-09-11 ENCOUNTER — Ambulatory Visit
Admission: RE | Admit: 2021-09-11 | Discharge: 2021-09-11 | Disposition: A | Discharge status: Home / Self Care | Payer: Medicare HMO | Source: Ambulatory Visit | Attending: Internal Medicine | Admitting: Internal Medicine

## 2021-09-11 ENCOUNTER — Other Ambulatory Visit: Payer: Self-pay | Admitting: Internal Medicine

## 2021-09-11 ENCOUNTER — Other Ambulatory Visit (HOSPITAL_COMMUNITY): Payer: Self-pay | Admitting: Internal Medicine

## 2021-09-11 DIAGNOSIS — R1084 Generalized abdominal pain: Secondary | ICD-10-CM

## 2021-09-11 DIAGNOSIS — K810 Acute cholecystitis: Secondary | ICD-10-CM | POA: Insufficient documentation

## 2021-09-11 LAB — POCT I-STAT CREATININE: Creatinine, Ser: 0.8 mg/dL (ref 0.44–1.00)

## 2021-09-11 MED ORDER — IOHEXOL 300 MG/ML  SOLN
100.0000 mL | Freq: Once | INTRAMUSCULAR | Status: AC | PRN
  Administered 2021-09-11: 100 mL via INTRAVENOUS

## 2021-09-12 ENCOUNTER — Other Ambulatory Visit: Payer: Self-pay | Admitting: Internal Medicine

## 2021-09-12 DIAGNOSIS — K8000 Calculus of gallbladder with acute cholecystitis without obstruction: Secondary | ICD-10-CM

## 2021-09-13 ENCOUNTER — Ambulatory Visit
Admission: RE | Admit: 2021-09-13 | Discharge: 2021-09-13 | Disposition: A | Discharge status: Home / Self Care | Payer: Medicare HMO | Source: Ambulatory Visit | Attending: Internal Medicine | Admitting: Internal Medicine

## 2021-09-13 ENCOUNTER — Other Ambulatory Visit: Payer: Self-pay

## 2021-09-13 DIAGNOSIS — K8000 Calculus of gallbladder with acute cholecystitis without obstruction: Secondary | ICD-10-CM | POA: Diagnosis present

## 2021-09-18 ENCOUNTER — Ambulatory Visit: Payer: BC Managed Care – PPO

## 2021-10-22 ENCOUNTER — Ambulatory Visit (INDEPENDENT_AMBULATORY_CARE_PROVIDER_SITE_OTHER): Payer: Medicare HMO | Admitting: Dermatology

## 2021-10-22 ENCOUNTER — Other Ambulatory Visit: Payer: Self-pay

## 2021-10-22 DIAGNOSIS — Z1283 Encounter for screening for malignant neoplasm of skin: Secondary | ICD-10-CM | POA: Diagnosis not present

## 2021-10-22 DIAGNOSIS — D229 Melanocytic nevi, unspecified: Secondary | ICD-10-CM

## 2021-10-22 DIAGNOSIS — B070 Plantar wart: Secondary | ICD-10-CM

## 2021-10-22 DIAGNOSIS — L570 Actinic keratosis: Secondary | ICD-10-CM | POA: Diagnosis not present

## 2021-10-22 DIAGNOSIS — L820 Inflamed seborrheic keratosis: Secondary | ICD-10-CM | POA: Diagnosis not present

## 2021-10-22 DIAGNOSIS — I781 Nevus, non-neoplastic: Secondary | ICD-10-CM

## 2021-10-22 DIAGNOSIS — Z86007 Personal history of in-situ neoplasm of skin: Secondary | ICD-10-CM

## 2021-10-22 DIAGNOSIS — D2330 Other benign neoplasm of skin of unspecified part of face: Secondary | ICD-10-CM

## 2021-10-22 DIAGNOSIS — D2271 Melanocytic nevi of right lower limb, including hip: Secondary | ICD-10-CM

## 2021-10-22 DIAGNOSIS — D1800 Hemangioma unspecified site: Secondary | ICD-10-CM

## 2021-10-22 DIAGNOSIS — D2339 Other benign neoplasm of skin of other parts of face: Secondary | ICD-10-CM

## 2021-10-22 DIAGNOSIS — L578 Other skin changes due to chronic exposure to nonionizing radiation: Secondary | ICD-10-CM

## 2021-10-22 DIAGNOSIS — L720 Epidermal cyst: Secondary | ICD-10-CM | POA: Diagnosis not present

## 2021-10-22 DIAGNOSIS — B079 Viral wart, unspecified: Secondary | ICD-10-CM

## 2021-10-22 DIAGNOSIS — L821 Other seborrheic keratosis: Secondary | ICD-10-CM

## 2021-10-22 DIAGNOSIS — Z85828 Personal history of other malignant neoplasm of skin: Secondary | ICD-10-CM

## 2021-10-22 DIAGNOSIS — L814 Other melanin hyperpigmentation: Secondary | ICD-10-CM

## 2021-10-22 DIAGNOSIS — B0229 Other postherpetic nervous system involvement: Secondary | ICD-10-CM

## 2021-10-22 NOTE — Patient Instructions (Addendum)
Over the counter (non-prescription) treatments for post herpetic neuralgia include numbing creams like pramoxine or lidocaine which temporarily reduce itch or Capsaicin-containing creams which cause a burning sensation but which sometimes over time will reset the nerves to stop producing itch.   If you choose to use Capsaicin cream, it is recommended to use it 5 times daily for 1 week followed by 3 times daily for 3-6 weeks. You may have to continue using it long-term. For severe cases, there are some prescription cream or pill options which may help.   Recommend over the counter wart remover to start after area heals from the LN2   Cryotherapy Aftercare  Wash gently with soap and water everyday.   Apply Vaseline and Band-Aid daily until healed.       If You Need Anything After Your Visit  If you have any questions or concerns for your doctor, please call our main line at [phone] and press option 4 to reach your doctor's medical assistant. If no one answers, please leave a voicemail as directed and we will return your call as soon as possible. Messages left after 4 pm will be answered the following business day.   You may also send [place] a message via [place]. We typically respond to MyChart messages within 1-2 business days.  For prescription refills, please ask your pharmacy to contact our office. Our fax number is [phone].  If you have an urgent issue when the clinic is closed that cannot wait until the next business day, you can page your doctor at the number below.    Please note that while we do our best to be available for urgent issues outside of office hours, we are not available 24/7.   If you have an urgent issue and are unable to reach [place], you may choose to seek medical care at your doctor's office, retail clinic, urgent care center, or emergency room.  If you have a medical emergency, please immediately call 911 or go to the emergency department.  Pager  Numbers  - Dr. Nehemiah Massed: [phone]  - Dr. Laurence Ferrari: [phone]  - Dr. Nicole Kindred: [phone]  In the event of inclement weather, please call our main line at [phone] for an update on the status of any delays or closures.  Dermatology Medication Tips: Please keep the boxes that topical medications come in in order to help keep track of the instructions about where and how to use these. Pharmacies typically print the medication instructions only on the boxes and not directly on the medication tubes.   If your medication is too expensive, please contact our office at [phone] option 4 or send [place] a message through [place].   We are unable to tell what your co-pay for medications will be in advance as this is different depending on your insurance coverage. However, we may be able to find a substitute medication at lower cost or fill out paperwork to get insurance to cover a needed medication.   If a prior authorization is required to get your medication covered by your insurance company, please allow [place] 1-2 business days to complete this process.  Drug prices often vary depending on where the prescription is filled and some pharmacies may offer cheaper prices.  The website www.goodrx.com contains coupons for medications through different pharmacies. The prices here do not account for what the cost may be with help from insurance (it may be cheaper with your insurance), but the website can give you the price if you did not  use any insurance.  - You can print the associated coupon and take it with your prescription to the pharmacy.  - You may also stop by our office during regular business hours and pick up a GoodRx coupon card.  - If you need your prescription sent electronically to a different pharmacy, notify our office through [facility] or by phone at [phone] option 4.     Si Usted Necesita Algo Despus de Su Visita  Tambin puede enviarnos un mensaje a travs de  Pharmacist, community. Por lo general respondemos a los mensajes de MyChart en el transcurso de 1 a 2 das hbiles.  Para renovar recetas, por favor pida a su farmacia que se ponga en contacto con nuestra oficina. Harland Dingwall de fax es [place] [phone].  Si tiene un asunto urgente cuando la clnica est cerrada y que no puede esperar hasta el siguiente da hbil, puede llamar/localizar a su doctor(a) al nmero que aparece a continuacin.   Por favor, tenga en cuenta que aunque hacemos todo lo posible para estar disponibles para asuntos urgentes fuera del horario de [place], no estamos disponibles las 24 horas del da, los 7 das de la [place].   Si tiene un problema urgente y no puede comunicarse con nosotros, puede optar por buscar atencin mdica  en el consultorio de su doctor(a), en una clnica privada, en un centro de atencin urgente o en una sala de emergencias.  Si tiene Engineering geologist, por favor llame inmediatamente al 911 o vaya a la sala de emergencias.  Nmeros de bper  - Dr. Nehemiah Massed: [phone]  - Dra. Moye: [phone]  - Dra. Nicole Kindred: [phone]  En caso de inclemencias del [place], por favor llame a Johnsie Kindred principal al [phone] para una actualizacin sobre el [place] de cualquier retraso o cierre.  Consejos para la medicacin en dermatologa: Por favor, guarde las cajas en las que vienen los medicamentos de uso tpico para ayudarle a seguir las instrucciones sobre dnde y cmo usarlos. Las farmacias generalmente imprimen las instrucciones del medicamento slo en las cajas y no directamente en los tubos del [place].   Si su medicamento es muy caro, por favor, pngase en contacto con Zigmund Daniel llamando al [phone] y presione la opcin 4 o envenos un mensaje a travs de Pharmacist, community.   No podemos decirle cul ser su copago por los medicamentos por adelantado ya que esto es diferente dependiendo de la cobertura de su seguro. Sin embargo, es posible que  podamos encontrar un medicamento sustituto a Electrical engineer un formulario para que el seguro cubra el medicamento que se considera necesario.   Si se requiere una autorizacin previa para que su compaa de seguros [place] su medicamento, por favor permtanos de 1 a 2 das hbiles para completar este proceso.  Los precios de los medicamentos varan con frecuencia dependiendo del Environmental consultant de dnde se surte la receta y alguna farmacias pueden ofrecer precios ms baratos.  El sitio web www.goodrx.com tiene cupones para medicamentos de Airline pilot. Los precios aqu no tienen en cuenta lo que podra costar con la ayuda del seguro (puede ser ms barato con su seguro), pero el sitio web puede darle el precio si no utiliz Research scientist (physical sciences).  - Puede imprimir el cupn correspondiente y llevarlo con su receta a la farmacia.  - Tambin puede pasar por nuestra oficina durante el horario de atencin regular y Charity fundraiser una tarjeta de cupones de GoodRx.  - Si necesita que su receta se enve electrnicamente  a [employer], informe a nuestra oficina a travs de MyChart de [place] o por telfono llamando al [phone] y presione la opcin 4.

## 2021-10-22 NOTE — Progress Notes (Signed)
Follow-Up Visit   Subjective  Jody Avery is a 76 y.o. female who presents for the following: Total body skin exam (Hx of BCC R lower pretibia, SCC IS L dorsal hand, AK) and check spots (Groin, >24m, no symptoms/L foot, ~4m, feels rough/L foot, white spot, not sure how long it has been there).  She has several itchy growths on back that she would like removed.  The patient presents for Total-Body Skin Exam (TBSE) for skin cancer screening and mole check.  The patient has spots, moles and lesions to be evaluated, some may be new or changing and the patient has concerns that these could be cancer.   The following portions of the chart were reviewed this encounter and updated as appropriate:       Review of Systems:  No other skin or systemic complaints except as noted in HPI or Assessment and Plan.  Objective  Well appearing patient in no apparent distress; mood and affect are within normal limits.  A full examination was performed including scalp, head, eyes, ears, nose, lips, neck, chest, axillae, abdomen, back, buttocks, bilateral upper extremities, bilateral lower extremities, hands, feet, fingers, toes, fingernails, and toenails. All findings within normal limits unless otherwise noted below.  R 3rd toe 4.74mm brown macule  R lat foot dorsum x 1 keratotic macule  L lat plantar foot x 1 Firm verrucous papule, 3 mm -- Discussed viral etiology and contagion.   Right Medial Thigh Firm white pap with punctum  bil legs Varices and telangiectasias bil legs  L mid back at braline x 3, R upper sternum x 1 (4) Stuck on waxy paps with erythema  L nasal dorsum 1.41mm pink flat pap  Left upper abdomen Skin clear, complaints of pain in the area    Assessment & Plan   Lentigines - Scattered tan macules - Due to sun exposure - Benign-appearing, observe - Recommend daily broad spectrum sunscreen SPF 30+ to sun-exposed areas, reapply every 2 hours as needed. - Call for  any changes - arms  Seborrheic Keratoses - Stuck-on, waxy, tan-brown papules and/or plaques  - Benign-appearing - Discussed benign etiology and prognosis. - Observe - Call for any changes - legs, back, arms  Melanocytic Nevi - Tan-brown and/or pink-flesh-colored symmetric macules and papules - Benign appearing on exam today - Observation - Call clinic for new or changing moles - Recommend daily use of broad spectrum spf 30+ sunscreen to sun-exposed areas.   Hemangiomas - Red papules - Discussed benign nature - Observe - Call for any changes - abdomen  Actinic Damage - Chronic condition, secondary to cumulative UV/sun exposure - diffuse scaly erythematous macules with underlying dyspigmentation - Recommend daily broad spectrum sunscreen SPF 30+ to sun-exposed areas, reapply every 2 hours as needed.  - Staying in the shade or wearing long sleeves, sun glasses (UVA+UVB protection) and wide brim hats (4-inch brim around the entire circumference of the hat) are also recommended for sun protection.  - Call for new or changing lesions. - chest  History of Basal Cell Carcinoma of the Skin - No evidence of recurrence today - Recommend regular full body skin exams - Recommend daily broad spectrum sunscreen SPF 30+ to sun-exposed areas, reapply every 2 hours as needed.  - Call if any new or changing lesions are noted between office visits  - R lower pretibia  History of Squamous Cell Carcinoma in Situ of the Skin - No evidence of recurrence today - Recommend regular full body skin  exams - Recommend daily broad spectrum sunscreen SPF 30+ to sun-exposed areas, reapply every 2 hours as needed.  - Call if any new or changing lesions are noted between office visits  - L dorsal hand  Sebaceous Hyperplasia - Small yellow papules with a central dell - Benign - Observe  - face  Skin cancer screening performed today.   Nevus R 3rd toe  Stable, Benign-appearing.  Observation.   Call clinic for new or changing lesions.  Recommend daily use of broad spectrum spf 30+ sunscreen to sun-exposed areas.    AK (actinic keratosis) R lat foot dorsum x 1  Destruction of lesion - R lat foot dorsum x 1  Destruction method: cryotherapy   Informed consent: discussed and consent obtained   Lesion destroyed using liquid nitrogen: Yes   Region frozen until ice ball extended beyond lesion: Yes   Outcome: patient tolerated procedure well with no complications   Post-procedure details: wound care instructions given   Additional details:  Prior to procedure, discussed risks of blister formation, small wound, skin dyspigmentation, or rare scar following cryotherapy. Recommend Vaseline ointment to treated areas while healing.   Viral warts, unspecified type L lat plantar foot x 1  Plantar wart  Discussed viral etiology and risk of spread.  Discussed multiple treatments may be required to clear warts.  Discussed possible post-treatment dyspigmentation and risk of recurrence.  Recommend otc wart remover to start after area heals from the LN2  Destruction of lesion - L lat plantar foot x 1  Destruction method: cryotherapy   Informed consent: discussed and consent obtained   Lesion destroyed using liquid nitrogen: Yes   Region frozen until ice ball extended beyond lesion: Yes   Outcome: patient tolerated procedure well with no complications   Post-procedure details: wound care instructions given   Additional details:  Prior to procedure, discussed risks of blister formation, small wound, skin dyspigmentation, or rare scar following cryotherapy. Recommend Vaseline ointment to treated areas while healing.   Epidermal cyst Right Medial Thigh  Benign-appearing. Exam most consistent with an epidermal inclusion cyst. Discussed that a cyst is a benign growth that can grow over time and sometimes get irritated or inflamed. Recommend observation if it is not bothersome. Discussed option of  surgical excision to remove it if it is growing, symptomatic, or other changes noted. Please call for new or changing lesions so they can be evaluated.    Spider veins bil legs  Benign, observe.    Inflamed seborrheic keratosis (4) L mid back at braline x 3, R upper sternum x 1  Destruction of lesion - L mid back at braline x 3, R upper sternum x 1  Destruction method: cryotherapy   Informed consent: discussed and consent obtained   Lesion destroyed using liquid nitrogen: Yes   Region frozen until ice ball extended beyond lesion: Yes   Outcome: patient tolerated procedure well with no complications   Post-procedure details: wound care instructions given   Additional details:  Prior to procedure, discussed risks of blister formation, small wound, skin dyspigmentation, or rare scar following cryotherapy. Recommend Vaseline ointment to treated areas while healing.   Fibrous papule of face L nasal dorsum  Benign-appearing.  Observation.  Call clinic for new or changing lesions.  Recommend daily use of broad spectrum spf 30+ sunscreen to sun-exposed areas.    Post herpetic neuralgia Left upper abdomen  Secondary to shingles outbreak in area, Chronic and persistent Unable to tolerate gabapentin  Shingles can also  cause significant pain or unusual skin sensations (post-herpetic neuralgia) which may persist after rash has resolved.     Over the counter (non-prescription) treatments for post herpetic neuralgia include numbing creams like pramoxine or lidocaine which temporarily reduce itch/pain or Capsaicin-containing creams which cause a burning sensation but which sometimes over time will reset the nerves to stop producing itch/pain.   If you choose to use Capsaicin cream, it is recommended to use it 5 times daily for 1 week followed by 3 times daily for 3-6 weeks. You may have to continue using it long-term. For severe cases, there are some prescription cream or pill options which may  help.    Return in about 1 year (around 10/22/2022) for TBSE, Hx of BCC, Hx of SCC IS, Hx of AKs, 68m f/u wart.  I, Othelia Pulling, RMA, am acting as scribe for Brendolyn Patty, MD .  Documentation: I have reviewed the above documentation for accuracy and completeness, and I agree with the above.  Brendolyn Patty MD

## 2021-11-26 ENCOUNTER — Ambulatory Visit: Payer: Medicare HMO | Admitting: Dermatology

## 2021-11-27 ENCOUNTER — Ambulatory Visit (INDEPENDENT_AMBULATORY_CARE_PROVIDER_SITE_OTHER): Payer: Medicare HMO | Admitting: Dermatology

## 2021-11-27 ENCOUNTER — Other Ambulatory Visit: Payer: Self-pay

## 2021-11-27 DIAGNOSIS — W57XXXA Bitten or stung by nonvenomous insect and other nonvenomous arthropods, initial encounter: Secondary | ICD-10-CM | POA: Diagnosis not present

## 2021-11-27 DIAGNOSIS — H01134 Eczematous dermatitis of left upper eyelid: Secondary | ICD-10-CM

## 2021-11-27 DIAGNOSIS — B079 Viral wart, unspecified: Secondary | ICD-10-CM

## 2021-11-27 DIAGNOSIS — L821 Other seborrheic keratosis: Secondary | ICD-10-CM

## 2021-11-27 DIAGNOSIS — S50862A Insect bite (nonvenomous) of left forearm, initial encounter: Secondary | ICD-10-CM | POA: Diagnosis not present

## 2021-11-27 DIAGNOSIS — L814 Other melanin hyperpigmentation: Secondary | ICD-10-CM

## 2021-11-27 DIAGNOSIS — L820 Inflamed seborrheic keratosis: Secondary | ICD-10-CM | POA: Diagnosis not present

## 2021-11-27 DIAGNOSIS — L309 Dermatitis, unspecified: Secondary | ICD-10-CM

## 2021-11-27 MED ORDER — HYDROCORTISONE 2.5 % EX CREA: TOPICAL_CREAM | CUTANEOUS | 0 refills | Status: DC

## 2021-11-27 NOTE — Patient Instructions (Addendum)

## 2021-11-27 NOTE — Progress Notes (Signed)
? ?Follow-Up Visit ?  ?Subjective  ?[place] Bobby Ragan is a 76 y.o. female who presents for the following: Follow-up (Patient here today for 1 month wart follow up at L lat plantar foot. Treated with LN2, patient not sure if it is improved or not. ). ? ?Patient does have 2 spots that have just come up at right shoulder (irritating) and left forearm, spot at left forearm itches and may be a bug bite. Also has new rash on eyelid. ? ?The following portions of the chart were reviewed this encounter and updated as appropriate:  ?  ?  ? ?Review of Systems:  No other skin or systemic complaints except as noted in HPI or Assessment and Plan. ? ?Objective  ?Well appearing patient in no apparent distress; mood and affect are within normal limits. ? ?A focused examination was performed including left foot, right shoulder, left forearm. Relevant physical exam findings are noted in the Assessment and Plan. ? ?left lateral plantar foot ?Small residual verrucous papule ? ?left forearm ?Pink edematous papule ? ?right shoulder ?Waxy pink tan patch ? ?left upper eyelid ?Erythema with mild scale L medial upper eyelid ? ? ? ?Assessment & Plan  ?Viral warts, unspecified type ?left lateral plantar foot ? ?Improved  ? ?Discussed viral etiology and risk of spread.  Discussed multiple treatments may be required to clear warts.  Discussed possible post-treatment dyspigmentation and risk of recurrence. ? ? ? ?Destruction of lesion - left lateral plantar foot ? ?Destruction method: cryotherapy   ?Informed consent: discussed and consent obtained   ?Lesion destroyed using liquid nitrogen: Yes   ?Region frozen until ice ball extended beyond lesion: Yes   ?Outcome: patient tolerated procedure well with no complications   ?Post-procedure details: wound care instructions given   ?Additional details:  Prior to procedure, discussed risks of blister formation, small wound, skin dyspigmentation, or rare scar following cryotherapy. Recommend Vaseline  ointment to treated areas while healing.  ? ?Reaction to insect bite ?left forearm ? ?Benign, observe.   ? ?Start TMC 0.1% cream twice daily as needed for itch. Avoid applying to face, groin, and axilla. Use as directed. Long-term use can cause thinning of the skin. ? ? ?Inflamed seborrheic keratosis ?right shoulder ? ?Destruction of lesion - right shoulder ? ?Destruction method: cryotherapy   ?Informed consent: discussed and consent obtained   ?Lesion destroyed using liquid nitrogen: Yes   ?Region frozen until ice ball extended beyond lesion: Yes   ?Outcome: patient tolerated procedure well with no complications   ?Post-procedure details: wound care instructions given   ?Additional details:  Prior to procedure, discussed risks of blister formation, small wound, skin dyspigmentation, or rare scar following cryotherapy. Recommend Vaseline ointment to treated areas while healing.  ? ?Dermatitis ?left upper eyelid ? ?Start HC 2.5% cream 1-2 times daily until itchy rash cleared for up to 2 weeks.  ? ? ? ?hydrocortisone 2.5 % cream - left upper eyelid ?Apply to eyelid 1-2 times daily for up to 2 weeks as needed. ? ?Seborrheic Keratoses ?- Stuck-on, waxy, tan-brown papules and/or plaques  ?- Benign-appearing ?- Discussed benign etiology and prognosis. ?- Observe ?- Call for any changes ? ?Lentigines ?- Scattered tan macules ?- Due to sun exposure ?- Benign-appering, observe ?- Recommend daily broad spectrum sunscreen SPF 30+ to sun-exposed areas, reapply every 2 hours as needed. ?- Call for any changes  ? ?Return for TBSE, as scheduled. ? ?Graciella Belton, RMA, am acting as scribe for Brendolyn Patty, MD . ? ?  Documentation: I have reviewed the above documentation for accuracy and completeness, and I agree with the above. ? ?Brendolyn Patty MD  ? ?

## 2022-01-04 ENCOUNTER — Emergency Department: Payer: Medicare HMO

## 2022-01-04 ENCOUNTER — Emergency Department
Admission: EM | Admit: 2022-01-04 | Discharge: 2022-01-04 | Disposition: A | Discharge status: Home / Self Care | Payer: Medicare HMO | Attending: Emergency Medicine | Admitting: Emergency Medicine

## 2022-01-04 ENCOUNTER — Other Ambulatory Visit: Payer: Self-pay

## 2022-01-04 ENCOUNTER — Encounter: Payer: Self-pay | Admitting: Emergency Medicine

## 2022-01-04 DIAGNOSIS — S01112A Laceration without foreign body of left eyelid and periocular area, initial encounter: Secondary | ICD-10-CM | POA: Diagnosis not present

## 2022-01-04 DIAGNOSIS — W1812XA Fall from or off toilet with subsequent striking against object, initial encounter: Secondary | ICD-10-CM | POA: Diagnosis not present

## 2022-01-04 DIAGNOSIS — S060X0A Concussion without loss of consciousness, initial encounter: Secondary | ICD-10-CM | POA: Insufficient documentation

## 2022-01-04 DIAGNOSIS — Y92002 Bathroom of unspecified non-institutional (private) residence single-family (private) house as the place of occurrence of the external cause: Secondary | ICD-10-CM | POA: Insufficient documentation

## 2022-01-04 DIAGNOSIS — S0990XA Unspecified injury of head, initial encounter: Secondary | ICD-10-CM | POA: Diagnosis present

## 2022-01-04 DIAGNOSIS — R55 Syncope and collapse: Secondary | ICD-10-CM | POA: Diagnosis not present

## 2022-01-04 DIAGNOSIS — S0181XA Laceration without foreign body of other part of head, initial encounter: Secondary | ICD-10-CM

## 2022-01-04 LAB — BASIC METABOLIC PANEL
Anion gap: 9 (ref 5–15)
BUN: 16 mg/dL (ref 8–23)
CO2: 26 mmol/L (ref 22–32)
Calcium: 9.7 mg/dL (ref 8.9–10.3)
Chloride: 100 mmol/L (ref 98–111)
Creatinine, Ser: 0.79 mg/dL (ref 0.44–1.00)
GFR, Estimated: >60 mL/min (ref >60)
Glucose, Bld: 118 mg/dL — ABNORMAL HIGH (ref 70–99)
Potassium: 4.2 mmol/L (ref 3.5–5.1)
Sodium: 135 mmol/L (ref 135–145)

## 2022-01-04 LAB — CBC
HCT: 44.9 % (ref 36.0–46.0)
Hemoglobin: 14.8 g/dL (ref 12.0–15.0)
MCH: 30.3 pg (ref 26.0–34.0)
MCHC: 33 g/dL (ref 30.0–36.0)
MCV: 91.8 fL (ref 80.0–100.0)
Platelets: 209 10*3/uL (ref 150–400)
RBC: 4.89 MIL/uL (ref 3.87–5.11)
RDW: 12.4 % (ref 11.5–15.5)
WBC: 11.1 10*3/uL — ABNORMAL HIGH (ref 4.0–10.5)
nRBC: 0 % (ref 0.0–0.2)

## 2022-01-04 MED ORDER — LIDOCAINE-EPINEPHRINE 2 %-1:100000 IJ SOLN
20.0000 mL | Freq: Once | INTRAMUSCULAR | Status: AC
Start: 2022-01-04 — End: 2022-01-04
  Administered 2022-01-04: 20 mL via INTRADERMAL
  Filled 2022-01-04: qty 1

## 2022-01-04 MED ORDER — BACITRACIN-NEOMYCIN-POLYMYXIN 400-5-5000 EX OINT
TOPICAL_OINTMENT | Freq: Once | CUTANEOUS | Status: AC
  Filled 2022-01-04: qty 1

## 2022-01-04 MED ORDER — ONDANSETRON 8 MG PO TBDP: 8.0000 mg | ORAL_TABLET | Freq: Three times a day (TID) | ORAL | 0 refills | Status: DC | PRN

## 2022-01-04 NOTE — Discharge Instructions (Addendum)
Please take your nausea medicine when you get home.  You may start taking your Pepcid/Protonix at night as well as in the morning to help with your GERD symptoms.  If you begin feeling nauseous, please use the prescribed nausea medicine ?

## 2022-01-04 NOTE — ED Provider Notes (Signed)
? ?[facility] ?Provider Note ? ? Event Date/Time  ? First MD Initiated Contact with Patient 01/04/22 1659   ?  (approximate) ?History  ?Loss of Consciousness, Laceration, and Dizziness ? ?HPI ?Jody Avery is a 76 y.o. female with stated past medical history of IBS and syncope who presents after an episode of syncope while sitting on the toilet earlier today.  Patient states that this is similar to previous episodes that she has had when she has some loose stools or has taken laxatives.  Patient states that she went to sit on the toilet to have a bowel movement and woke up on the floor unclear as to what she may have hit with her head.  Patient does endorse a laceration above the left eyebrow.  Patient denies any subsequent nausea/vomiting, syncope, altered mental status, or bowel/bladder incontinence.  Patient denies any weakness/numbness/paresthesias in any extremity ?Physical Exam  ?Triage Vital Signs: ?ED Triage Vitals  ?Enc Vitals Group  ?   BP 01/04/22 1554 (!) 152/75  ?   Pulse Rate 01/04/22 1554 83  ?   Resp 01/04/22 1554 16  ?   Temp 01/04/22 1554 97.8 ?F (36.6 ?C)  ?   Temp Source 01/04/22 1554 Oral  ?   SpO2 01/04/22 1554 100 %  ?   Weight 01/04/22 1547 176 lb 5.9 oz (80 kg)  ?   Height 01/04/22 1547 '5\' 2"'$  (1.575 m)  ?   Head Circumference --   ?   Peak Flow --   ?   Pain Score 01/04/22 1547 10  ?   Pain Loc --   ?   Pain Edu? --   ?   Excl. in [place]? --   ? ?Most recent vital signs: ?Vitals:  ? 01/04/22 1554 01/04/22 1800  ?BP: (!) 152/75 (!) 146/87  ?Pulse: 83 90  ?Resp: 16 16  ?Temp: 97.8 ?F (36.6 ?C)   ?SpO2: 100% 98%  ? ?General: Awake, oriented x4. ?CV:  Good peripheral perfusion.  ?Resp:  Normal effort.  ?Abd:  No distention.  ?Other:  Overweight elderly Caucasian female laying in bed in no distress.  2 cm laceration over the medial aspect of the left eyebrow ?ED Results / Procedures / Treatments  ?Labs ?(all labs ordered are listed, but only abnormal results are  displayed) ?Labs Reviewed  ?BASIC METABOLIC PANEL - Abnormal; Notable for the following components:  ?    Result Value  ? Glucose, Bld 118 (*)   ? All other components within normal limits  ?CBC - Abnormal; Notable for the following components:  ? WBC 11.1 (*)   ? All other components within normal limits  ?URINALYSIS, ROUTINE W REFLEX MICROSCOPIC  ? ?EKG ?ED ECG REPORT ?I, Naaman Plummer, the attending physician, personally viewed and interpreted this ECG. ?Date: 01/04/2022 ?EKG Time: 1551 ?Rate: 84 ?Rhythm: normal sinus rhythm ?QRS Axis: normal ?Intervals: normal ?ST/T Wave abnormalities: normal ?Narrative Interpretation: no evidence of acute ischemia ?RADIOLOGY ?ED MD interpretation: CT of the head without contrast interpreted by me shows no evidence of acute abnormalities including no intracerebral hemorrhage, obvious masses, or significant edema.  There is a left frontal scalp hematoma and laceration ?-Agree with radiology assessment ?Official radiology report(s): ?CT HEAD WO CONTRAST ? ?Result Date: 01/04/2022 ?CLINICAL DATA:  Head trauma, minor (Age >= 65y) Syncope while on the toilet. Fall forward striking head. Laceration of forehead. EXAM: CT HEAD WITHOUT CONTRAST TECHNIQUE: Contiguous axial images were obtained from the base of  the skull through the vertex without intravenous contrast. RADIATION DOSE REDUCTION: This exam was performed according to the departmental dose-optimization program which includes automated exposure control, adjustment of the mA and/or kV according to patient size and/or use of iterative reconstruction technique. COMPARISON:  None Available. FINDINGS: Brain: No intracranial hemorrhage, mass effect, or midline shift. Brain volume is normal for age. No hydrocephalus. The basilar cisterns are patent. There is mild periventricular and deep white matter hypodensity typical of chronic small vessel ischemia. No evidence of territorial infarct or acute ischemia. No extra-axial or  intracranial fluid collection. Vascular: No hyperdense vessel or unexpected calcification. Skull: No fracture or focal lesion. Sinuses/Orbits: Left frontal scalp hematoma and laceration. No facial bone fracture. Bilateral lens extraction. No mastoid effusion. Other: None. IMPRESSION: Left frontal scalp hematoma and laceration. No acute intracranial abnormality. No skull fracture. Electronically Signed   By: Keith Rake M.D.   On: 01/04/2022 16:49  ? ?CT Cervical Spine Wo Contrast ? ?Result Date: 01/04/2022 ?CLINICAL DATA:  Neck trauma (Age >= 65y) Syncope while on the toilet. Fall forward striking head. Laceration of forehead. EXAM: CT CERVICAL SPINE WITHOUT CONTRAST TECHNIQUE: Multidetector CT imaging of the cervical spine was performed without intravenous contrast. Multiplanar CT image reconstructions were also generated. RADIATION DOSE REDUCTION: This exam was performed according to the departmental dose-optimization program which includes automated exposure control, adjustment of the mA and/or kV according to patient size and/or use of iterative reconstruction technique. COMPARISON:  None Available. FINDINGS: Alignment: Normal. Skull base and vertebrae: No acute fracture. Vertebral body heights are maintained. The dens and skull base are intact. Soft tissues and spinal canal: No prevertebral fluid or swelling. No visible canal hematoma. Disc levels: Minor for age endplate spurring. Slight disc space narrowing at C5-C6. Upper chest: No acute or unexpected findings. Other: None. IMPRESSION: No acute fracture or subluxation of the cervical spine. Electronically Signed   By: Keith Rake M.D.   On: 01/04/2022 16:52   ?PROCEDURES: ?Critical Care performed: No ?.1-3 Lead EKG Interpretation ?Performed by: Naaman Plummer, MD ?Authorized by: Naaman Plummer, MD  ? ?  Interpretation: normal   ?  ECG rate:  91 ?  ECG rate assessment: normal   ?  Rhythm: sinus rhythm   ?  Ectopy: none   ?  Conduction: normal    ?..Laceration Repair ? ?Date/Time: 01/04/2022 9:03 PM ?Performed by: Naaman Plummer, MD ?Authorized by: Naaman Plummer, MD  ? ?Consent:  ?  Consent obtained:  Verbal ?  Consent given by:  Patient ?  Risks, benefits, and alternatives were discussed: yes   ?  Risks discussed:  Infection, poor cosmetic result and poor wound healing ?  Alternatives discussed:  No treatment ?Universal protocol:  ?  Immediately prior to procedure, a time out was called: yes   ?  Patient identity confirmed:  Verbally with patient ?Anesthesia:  ?  Anesthesia method:  Local infiltration ?  Local anesthetic:  Lidocaine 2% WITH epi ?Laceration details:  ?  Location:  Face ?  Face location:  L eyebrow ?  Length (cm):  2 ?  Depth (mm):  8 ?Pre-procedure details:  ?  Preparation:  Patient was prepped and draped in usual sterile fashion and imaging obtained to evaluate for foreign bodies ?Exploration:  ?  Imaging obtained: x-ray   ?  Imaging outcome: foreign body not noted   ?  Wound exploration: wound explored through full range of motion and entire depth of wound visualized   ?  Contaminated: no   ?Treatment:  ?  Area cleansed with:  Povidone-iodine and saline ?  Amount of cleaning:  Standard ?  Irrigation solution:  Sterile water ?  Irrigation volume:  200 ?  Irrigation method:  Pressure wash ?  Visualized foreign bodies/material removed: no   ?  Debridement:  None ?  Undermining:  None ?  Scar revision: no   ?Skin repair:  ?  Repair method:  Sutures ?  Suture size:  4-0 ?  Suture material:  Plain gut ?  Suture technique:  Simple interrupted ?  Number of sutures:  4 ?Approximation:  ?  Approximation:  Close ?Repair type:  ?  Repair type:  Simple ?Post-procedure details:  ?  Dressing:  Antibiotic ointment and non-adherent dressing ?  Procedure completion:  Tolerated well, no immediate complications ?MEDICATIONS ORDERED IN ED: ?Medications  ?lidocaine-EPINEPHrine (XYLOCAINE W/EPI) 2 %-1:100000 (with pres) injection 20 mL (has no administration  in time range)  ? ?IMPRESSION / MDM / ASSESSMENT AND PLAN / ED COURSE  ?I reviewed the triage vital signs and the nursing notes. ?             ?               ?The patient is on the cardiac monitor to evaluate for evidence of

## 2022-01-04 NOTE — ED Notes (Signed)
Dc ppw provided. Rx information and followup provided. Pt denies anyquestions. Pt provides verbal consent for dc and is assisted via wheelchair with family. Alert and oriented x4 ?

## 2022-01-04 NOTE — ED Triage Notes (Signed)
Pt reports was sitting on the toilet and had a syncopal episode. Pt reports fell forward and hit her head. Pt with laceration to forehead. Pt unsure of what she hit her head on. Pt reports has had these episodes before while on the toilet several times. Pt reports severe pain to head and reports feels cold. Pt pale in triage.  ?

## 2022-01-22 ENCOUNTER — Encounter: Payer: Self-pay | Admitting: Gastroenterology

## 2022-01-23 ENCOUNTER — Encounter: Payer: Self-pay | Admitting: Gastroenterology

## 2022-01-23 ENCOUNTER — Ambulatory Visit: Payer: Medicare HMO | Admitting: Anesthesiology

## 2022-01-23 ENCOUNTER — Encounter
Admission: RE | Disposition: A | Discharge status: Home / Self Care | Payer: Self-pay | Source: Home / Self Care | Attending: Gastroenterology

## 2022-01-23 ENCOUNTER — Ambulatory Visit
Admission: RE | Admit: 2022-01-23 | Discharge: 2022-01-23 | Disposition: A | Discharge status: Home / Self Care | Payer: Medicare HMO | Attending: Gastroenterology | Admitting: Gastroenterology

## 2022-01-23 DIAGNOSIS — R1084 Generalized abdominal pain: Secondary | ICD-10-CM | POA: Diagnosis not present

## 2022-01-23 DIAGNOSIS — K449 Diaphragmatic hernia without obstruction or gangrene: Secondary | ICD-10-CM | POA: Insufficient documentation

## 2022-01-23 DIAGNOSIS — K5909 Other constipation: Secondary | ICD-10-CM | POA: Diagnosis present

## 2022-01-23 DIAGNOSIS — K5730 Diverticulosis of large intestine without perforation or abscess without bleeding: Secondary | ICD-10-CM | POA: Insufficient documentation

## 2022-01-23 DIAGNOSIS — K317 Polyp of stomach and duodenum: Secondary | ICD-10-CM | POA: Insufficient documentation

## 2022-01-23 DIAGNOSIS — R112 Nausea with vomiting, unspecified: Secondary | ICD-10-CM | POA: Diagnosis not present

## 2022-01-23 DIAGNOSIS — D122 Benign neoplasm of ascending colon: Secondary | ICD-10-CM | POA: Diagnosis not present

## 2022-01-23 DIAGNOSIS — K3189 Other diseases of stomach and duodenum: Secondary | ICD-10-CM | POA: Insufficient documentation

## 2022-01-23 HISTORY — PX: COLONOSCOPY: SHX5424

## 2022-01-23 HISTORY — DX: Moderate persistent asthma, uncomplicated: J45.40

## 2022-01-23 HISTORY — PX: ESOPHAGOGASTRODUODENOSCOPY: SHX5428

## 2022-01-23 SURGERY — COLONOSCOPY
Anesthesia: General

## 2022-01-23 MED ORDER — PROPOFOL 500 MG/50ML IV EMUL
INTRAVENOUS | Status: DC | PRN
  Administered 2022-01-23: 120 ug/kg/min via INTRAVENOUS

## 2022-01-23 MED ORDER — LIDOCAINE HCL (CARDIAC) PF 100 MG/5ML IV SOSY
PREFILLED_SYRINGE | INTRAVENOUS | Status: DC | PRN
  Administered 2022-01-23: 50 mg via INTRAVENOUS

## 2022-01-23 MED ORDER — SODIUM CHLORIDE 0.9 % IV SOLN: INTRAVENOUS | Status: DC

## 2022-01-23 MED ORDER — PROPOFOL 500 MG/50ML IV EMUL
INTRAVENOUS | Status: AC
  Filled 2022-01-23: qty 100

## 2022-01-23 MED ORDER — PROPOFOL 10 MG/ML IV BOLUS
INTRAVENOUS | Status: DC | PRN
  Administered 2022-01-23: 100 mg via INTRAVENOUS

## 2022-01-23 MED ORDER — GLYCOPYRROLATE 0.2 MG/ML IJ SOLN
INTRAMUSCULAR | Status: DC | PRN
Start: 2022-01-23 — End: 2022-01-23
  Administered 2022-01-23: .2 mg via INTRAVENOUS

## 2022-01-23 NOTE — Op Note (Signed)
[facility] Gastroenterology Patient Name: [name] Procedure Date: 01/23/2022 7:18 AM MRN: [id number] Account #: [id number] Date of Birth: 15-Nov-1945 Admit Type: Outpatient Age: 76 Room: [facility] ENDO ROOM 1 Gender: Female Note Status: Finalized Instrument Name: Upper Endoscope [id number] Procedure:             Upper GI endoscopy Indications:           Generalized abdominal pain, Nausea with vomiting Providers:             Rueben Bash, DO Referring MD:          Rusty Aus, MD (Referring MD) Medicines:             Monitored Anesthesia Care Complications:         No immediate complications. Estimated blood loss:                         Minimal. Procedure:             Pre-Anesthesia Assessment:                        - Prior to the procedure, a History and Physical was                         performed, and patient medications and allergies were                         reviewed. The patient is competent. The risks and                         benefits of the procedure and the sedation options and                         risks were discussed with the patient. All questions                         were answered and informed consent was obtained.                         Patient identification and proposed procedure were                         verified by the physician, the nurse, the anesthetist                         and the technician in the endoscopy suite. Mental                         Status Examination: alert and oriented. Airway                         Examination: normal oropharyngeal airway and neck                         mobility. Respiratory Examination: clear to                         auscultation. CV Examination: RRR, no murmurs, no S3  or S4. Prophylactic Antibiotics: The patient does not                         require prophylactic antibiotics. Prior                         Anticoagulants: The patient has taken no  previous                         anticoagulant or antiplatelet agents. ASA Grade                         Assessment: III - A patient with severe systemic                         disease. After reviewing the risks and benefits, the                         patient was deemed in satisfactory condition to                         undergo the procedure. The anesthesia plan was to use                         monitored anesthesia care (MAC). Immediately prior to                         administration of medications, the patient was                         re-assessed for adequacy to receive sedatives. The                         heart rate, respiratory rate, oxygen saturations,                         blood pressure, adequacy of pulmonary ventilation, and                         response to care were monitored throughout the                         procedure. The physical status of the patient was                         re-assessed after the procedure.                        After obtaining informed consent, the endoscope was                         passed under direct vision. Throughout the procedure,                         the patient's blood pressure, pulse, and oxygen                         saturations were monitored continuously. The Endoscope  was introduced through the mouth, and advanced to the                         second part of duodenum. The upper GI endoscopy was                         accomplished without difficulty. The patient tolerated                         the procedure well. Findings:      Localized mildly erythematous mucosa without active bleeding and with no       stigmata of bleeding was found in the duodenal bulb. Estimated blood       loss: none.      The exam of the duodenum was otherwise normal.      A small hiatal hernia was present. Estimated blood loss: none.      A few less than 5 mm sessile polyps with no bleeding and no stigmata of        recent bleeding were found in the gastric body. Biopsies were taken with       a cold forceps for histology. Estimated blood loss was minimal.      Normal mucosa was found in the entire examined stomach. Biopsies were       taken with a cold forceps for Helicobacter pylori testing. Estimated       blood loss was minimal.      The Z-line was regular. Estimated blood loss: none.      Esophagogastric landmarks were identified: the gastroesophageal junction       was found at 36 cm from the incisors.      The exam of the esophagus was otherwise normal. Impression:            - Erythematous duodenopathy.                        - Small hiatal hernia.                        - A few gastric polyps. Biopsied.                        - Normal mucosa was found in the entire stomach.                         Biopsied.                        - Z-line regular.                        - Esophagogastric landmarks identified. Recommendation:        - Discharge patient to home.                        - Resume previous diet.                        - Continue present medications.                        - Await pathology results.                        -  Return to GI clinic as previously scheduled.                        - proceed with colonoscopy                        - The findings and recommendations were discussed with                         the patient's family. Procedure Code(s):     --- Professional ---                        [phone], Esophagogastroduodenoscopy, flexible,                         transoral; with biopsy, single or multiple Diagnosis Code(s):     --- Professional ---                        K31.89, Other diseases of stomach and duodenum                        K44.9, Diaphragmatic hernia without obstruction or                         gangrene                        K31.7, Polyp of stomach and duodenum                        R10.84, Generalized abdominal pain                         R11.2, Nausea with vomiting, unspecified CPT copyright 2019 [employer]. All rights reserved. The codes documented in this report are preliminary and upon coder review may  be revised to meet current compliance requirements. Attending Participation:      I personally performed the entire procedure. Volney American, DO [name] DO, DO 01/23/2022 7:49:43 AM This report has been signed electronically. Number of Addenda: 0 Note Initiated On: 01/23/2022 7:18 AM Estimated Blood Loss:  Estimated blood loss was minimal.      [facility]

## 2022-01-23 NOTE — Anesthesia Preprocedure Evaluation (Addendum)
Anesthesia Evaluation  Patient identified by MRN, date of birth, ID band Patient awake    Reviewed: Allergy & Precautions, NPO status , Patient's Chart, lab work & pertinent test results  Airway Mallampati: II  TM Distance: >3 FB Neck ROM: full    Dental no notable dental hx. (+) Chipped   Pulmonary neg pulmonary ROS,    Pulmonary exam normal        Cardiovascular negative cardio ROS Normal cardiovascular exam     Neuro/Psych negative neurological ROS  negative psych ROS   GI/Hepatic negative GI ROS, Neg liver ROS,   Endo/Other  negative endocrine ROS  Renal/GU negative Renal ROS  negative genitourinary   Musculoskeletal   Abdominal   Peds  Hematology negative hematology ROS (+)   Anesthesia Other Findings Past Medical History: No date: Allergy     Comment:  allergic rhinitis No date: Asthma 02/07/2020: Basal cell carcinoma     Comment:  R lower pretibia No date: DDD (degenerative disc disease)     Comment:  low back pain deg disc dz with infections No date: Hearing loss     Comment:  mild No date: Hyperlipidemia No date: Osteopenia No date: Osteoporosis No date: RAD (reactive airway disease), moderate persistent,  uncomplicated 46/28/6381: Squamous cell carcinoma of skin     Comment:  left dorsal hand/in situ No date: Tinnitus  Past Surgical History: 1989: ABDOMINAL HYSTERECTOMY     Comment:  total, fibroids No date: BACK SURGERY 01/23/2022: COLONOSCOPY; N/A     Comment:  Procedure: COLONOSCOPY;  Surgeon: [name],              DO;  Location: [facility] ENDOSCOPY;  Service:               Gastroenterology;  Laterality: N/A; 01/23/2022: ESOPHAGOGASTRODUODENOSCOPY; N/A     Comment:  Procedure: ESOPHAGOGASTRODUODENOSCOPY (EGD);  Surgeon:               [name], DO;  Location: [facility] ENDOSCOPY;                Service: Gastroenterology;  Laterality: N/A; No date: EYE SURGERY No date:  KNEE CARTILAGE SURGERY 2007: torn meniscus     Comment:  left  No date: TRIGGER FINGER RELEASE; Right     Comment:  right, middle and inder fingers  BMI    Body Mass Index: 31.09 kg/m      Reproductive/Obstetrics negative OB ROS                            Anesthesia Physical Anesthesia Plan  ASA: 2  Anesthesia Plan: General   Post-op Pain Management:    Induction: Intravenous  PONV Risk Score and Plan: Propofol infusion and TIVA  Airway Management Planned: Natural Airway  Additional Equipment:   Intra-op Plan:   Post-operative Plan:   Informed Consent: I have reviewed the patients History and Physical, chart, labs and discussed the procedure including the risks, benefits and alternatives for the proposed anesthesia with the patient or authorized representative who has indicated his/her understanding and acceptance.     Dental Advisory Given  Plan Discussed with: CRNA and Surgeon  Anesthesia Plan Comments:         Anesthesia Quick Evaluation

## 2022-01-23 NOTE — Anesthesia Postprocedure Evaluation (Signed)
Anesthesia Post Note  Patient: Jody Avery  Procedure(s) Performed: COLONOSCOPY ESOPHAGOGASTRODUODENOSCOPY (EGD)  Patient location during evaluation: PACU Anesthesia Type: General Level of consciousness: awake and oriented Pain management: satisfactory to patient Vital Signs Assessment: post-procedure vital signs reviewed and stable Respiratory status: spontaneous breathing and nonlabored ventilation Anesthetic complications: no   No notable events documented.   Last Vitals:  Vitals:   01/23/22 0842 01/23/22 0852  BP: 128/75 (!) 146/68  Pulse: 75 78  Resp: 15 15  Temp:    SpO2: 97% 97%    Last Pain:  Vitals:   01/23/22 0852  TempSrc:   PainSc: 0-No pain                 VAN STAVEREN,Arvine Clayburn

## 2022-01-23 NOTE — Transfer of Care (Signed)
Immediate Anesthesia Transfer of Care Note  Patient: Jody Avery  Procedure(s) Performed: COLONOSCOPY ESOPHAGOGASTRODUODENOSCOPY (EGD)  Patient Location: PACU and Endoscopy Unit  Anesthesia Type:General  Level of Consciousness: drowsy  Airway & Oxygen Therapy: Patient Spontanous Breathing and Patient connected to nasal cannula oxygen  Post-op Assessment: Report given to RN  Post vital signs: stable  Last Vitals:  Vitals Value Taken Time  BP    Temp    Pulse    Resp    SpO2      Last Pain:  Vitals:   01/23/22 0704  TempSrc: Temporal  PainSc: 0-No pain         Complications: No notable events documented.

## 2022-01-23 NOTE — Op Note (Signed)
[facility] Patient Name: [name] Procedure Date: 01/23/2022 7:16 AM MRN: [id number] Account #: [id number] Date of Birth: June 27, 1946 Admit Type: Outpatient Age: 76 Room: [facility] ENDO ROOM 1 Gender: Female Note Status: Finalized Instrument Name: Colonoscope [id number] Procedure:             Colonoscopy Indications:           Incidental constipation noted Providers:             [name] DO, DO Referring MD:          Rusty Aus, MD (Referring MD) Medicines:             Monitored Anesthesia Care Complications:         No immediate complications. Estimated blood loss:                         Minimal. Procedure:             Pre-Anesthesia Assessment:                        - Prior to the procedure, a History and Physical was                         performed, and patient medications and allergies were                         reviewed. The patient is competent. The risks and                         benefits of the procedure and the sedation options and                         risks were discussed with the patient. All questions                         were answered and informed consent was obtained.                         Patient identification and proposed procedure were                         verified by the physician, the nurse, the anesthetist                         and the technician in the endoscopy suite. Mental                         Status Examination: alert and oriented. Airway                         Examination: normal oropharyngeal airway and neck                         mobility. Respiratory Examination: clear to                         auscultation. CV Examination: RRR, no murmurs, no S3  or S4. Prophylactic Antibiotics: The patient does not                         require prophylactic antibiotics. Prior                         Anticoagulants: The patient has taken no previous                          anticoagulant or antiplatelet agents. ASA Grade                         Assessment: III - A patient with severe systemic                         disease. After reviewing the risks and benefits, the                         patient was deemed in satisfactory condition to                         undergo the procedure. The anesthesia plan was to use                         monitored anesthesia care (MAC). Immediately prior to                         administration of medications, the patient was                         re-assessed for adequacy to receive sedatives. The                         heart rate, respiratory rate, oxygen saturations,                         blood pressure, adequacy of pulmonary ventilation, and                         response to care were monitored throughout the                         procedure. The physical status of the patient was                         re-assessed after the procedure.                        After obtaining informed consent, the colonoscope was                         passed under direct vision. Throughout the procedure,                         the patient's blood pressure, pulse, and oxygen                         saturations were monitored continuously. The  Colonoscope was introduced through the anus and                         advanced to the the terminal ileum, with                         identification of the appendiceal orifice and IC                         valve. The colonoscopy was performed without                         difficulty. The patient tolerated the procedure well.                         The quality of the bowel preparation was evaluated                         using the BBPS [facility] Bowel Preparation Scale) with                         scores of: Right Colon = 3, Transverse Colon = 3 and                         Left Colon = 3 (entire mucosa seen well with no                         residual  staining, small fragments of stool or opaque                         liquid). The total BBPS score equals 9. The terminal                         ileum, ileocecal valve, appendiceal orifice, and                         rectum were photographed. Findings:      The perianal and digital rectal examinations were normal. Pertinent       negatives include normal sphincter tone.      The terminal ileum appeared normal. Estimated blood loss: none.      Multiple small-mouthed diverticula were found in the entire colon.       Scattered diverticula, but most prominently in the sigmoid colon.       Estimated blood loss: none.      A 3 to 4 mm polyp was found in the ascending colon. The polyp was       sessile. The polyp was removed with a cold snare. Resection and       retrieval were complete. Estimated blood loss was minimal.      The exam was otherwise without abnormality on direct and retroflexion       views. Impression:            - The examined portion of the ileum was normal.                        - Diverticulosis in the entire examined colon.                        -  One 3 to 4 mm polyp in the ascending colon, removed                         with a cold snare. Resected and retrieved.                        - The examination was otherwise normal on direct and                         retroflexion views. Recommendation:        - Discharge patient to home.                        - Resume previous diet.                        - Continue present medications.                        - No aspirin, ibuprofen, naproxen, or other                         non-steroidal anti-inflammatory drugs for 5 days after                         polyp removal.                        - Await pathology results.                        - Repeat colonoscopy for surveillance based on                         pathology results.                        - Return to GI office as previously scheduled.                        -  The findings and recommendations were discussed with                         the patient's family.                        - Resume home bowel regimen- daily miralax. Can                         consider restart amitiza at this time. Procedure Code(s):     --- Professional ---                        [phone], Colonoscopy, flexible; with removal of                         tumor(s), polyp(s), or other lesion(s) by snare                         technique Diagnosis Code(s):     --- Professional ---  K63.5, Polyp of colon                        K57.30, Diverticulosis of large intestine without                         perforation or abscess without bleeding CPT copyright 2019 American Medical Association. All rights reserved. The codes documented in this report are preliminary and upon coder review may  be revised to meet current compliance requirements. Attending Participation:      I personally performed the entire procedure. Volney American, DO [name] DO, DO 01/23/2022 8:23:32 AM This report has been signed electronically. Number of Addenda: 0 Note Initiated On: 01/23/2022 7:16 AM Scope Withdrawal Time: 0 hours 19 minutes 47 seconds  Total Procedure Duration: 0 hours 26 minutes 9 seconds  Estimated Blood Loss:  Estimated blood loss was minimal.      Sarah Bush [facility]

## 2022-01-23 NOTE — Interval H&P Note (Signed)
History and Physical Interval Note: Preprocedure H&P from 01/23/22  was reviewed and there was no interval change after seeing and examining the patient.  Written consent was obtained from the patient after discussion of risks, benefits, and alternatives. Patient has consented to proceed with Esophagogastroduodenoscopy and Colonoscopy with possible intervention   01/23/2022 7:30 AM  Jody Avery  has presented today for surgery, with the diagnosis of Encounter for screening colonoscopy for non-high-risk patient (Z12.11) Right lower quadrant abdominal pain (R10.31) Constipation, unspecified constipation type (K59.00) Nausea and vomiting, unspecified vomiting type (R11.2).  The various methods of treatment have been discussed with the patient and family. After consideration of risks, benefits and other options for treatment, the patient has consented to  Procedure(s): COLONOSCOPY (N/A) ESOPHAGOGASTRODUODENOSCOPY (EGD) (N/A) as a surgical intervention.  The patient's history has been reviewed, patient examined, no change in status, stable for surgery.  I have reviewed the patient's chart and labs.  Questions were answered to the patient's satisfaction.     [name]

## 2022-01-23 NOTE — H&P (Signed)
Pre-Procedure H&P   Patient ID: Jody Avery is a 76 y.o. female.  Gastroenterology Provider: [name], DO  PCP: Rusty Aus, MD  Date: 01/23/2022  HPI Jody Avery is a 76 y.o. female who presents today for Esophagogastroduodenoscopy and Colonoscopy for chronic constipation; ruq gastritis/duodenitis. Pt notes if she has regular BM that she avoids abdominal pain. The active pain also improves with BM. Some n/v/retching No weight loss or appetite change. No diarrhea/melena/hematochezia   [place] gallstones, fld,  Cr 0.7, hgb 14.8, plt 209, b12 638, lipase 21 fol 15.1, alpha gal negative; nml lft Ct January- wall thickening of colon, diverticulosis, small HH No previous egd CSY- approx 10 years ago in [place] with [facility]- no polyps   Past Medical History:  Diagnosis Date   Allergy    allergic rhinitis   Asthma    Basal cell carcinoma 02/07/2020   R lower pretibia   DDD (degenerative disc disease)    low back pain deg disc dz with infections   Hearing loss    mild   Hyperlipidemia    Osteopenia    Osteoporosis    RAD (reactive airway disease), moderate persistent, uncomplicated    Squamous cell carcinoma of skin 08/10/2013   left dorsal hand/in situ   Tinnitus     Past Surgical History:  Procedure Laterality Date   ABDOMINAL HYSTERECTOMY  1989   total, fibroids   BACK SURGERY     EYE SURGERY     KNEE CARTILAGE SURGERY     torn meniscus  2007   left    TRIGGER FINGER RELEASE Right    right, middle and inder fingers    Family History No h/o GI disease or malignancy  Review of Systems  Constitutional:  Negative for activity change, appetite change, chills, diaphoresis, fatigue, fever and unexpected weight change.  HENT:  Negative for trouble swallowing and voice change.   Respiratory:  Negative for shortness of breath and wheezing.   Cardiovascular:  Negative for chest pain, palpitations and leg swelling.   Gastrointestinal:  Positive for abdominal pain, constipation, nausea and vomiting. Negative for abdominal distention, anal bleeding, blood in stool, diarrhea and rectal pain.  Musculoskeletal:  Negative for arthralgias and myalgias.  Skin:  Negative for color change and pallor.  Neurological:  Negative for dizziness, syncope and weakness.  Psychiatric/Behavioral:  Negative for confusion.   All other systems reviewed and are negative.   Medications No current facility-administered medications on file prior to encounter.   Current Outpatient Medications on File Prior to Encounter  Medication Sig Dispense Refill   acetaminophen (TYLENOL) 500 MG tablet Take 500 mg by mouth every 6 (six) hours as needed.     azelastine (ASTELIN) 0.1 % nasal spray Place into both nostrils 2 (two) times daily. Use in each nostril as directed     dicyclomine (BENTYL) 10 MG capsule Take 10 mg by mouth in the morning and at bedtime.     pantoprazole (PROTONIX) 40 MG tablet Take 40 mg by mouth daily.     polyethylene glycol powder (GLYCOLAX/MIRALAX) 17 GM/SCOOP powder Take 1 Container by mouth once.     vitamin B-12 (CYANOCOBALAMIN) 1000 MCG tablet Take 1,000 mcg by mouth daily.     ADVAIR DISKUS 100-50 MCG/DOSE AEPB Inhale 1 puff into the lungs every 12 (twelve) hours. (Patient not taking: Reported on 10/22/2021) 60 each 11   albuterol (PROVENTIL HFA;VENTOLIN HFA) 108 (90 BASE) MCG/ACT inhaler Inhale into the lungs  as needed.      Calcium Carbonate-Vit D-Min (CALCIUM 1200 PO) Take 1 tablet by mouth daily. (Patient not taking: Reported on 10/22/2021)     cholecalciferol (VITAMIN D) 1000 units tablet Take 4,000 Units by mouth daily.     denosumab (PROLIA) 60 MG/ML SOSY injection Inject 1 mL into the skin every 6 (six) months. (Patient not taking: No sig reported)     Fluocinolone Acetonide Body 0.01 % OIL Apply 1-2 drops to ears 1-2 times a day as needed for itch and scale. (Patient not taking: Reported on 10/22/2021) 120  mL 1   fluticasone (FLONASE) 50 MCG/ACT nasal spray PLACE 2 SPRAYS INTO BOTH NOSTRILS AS NEEDED. 48 mL 3   hydrocortisone (ANUSOL-HC) 2.5 % rectal cream Apply small amount to anal area twice daily as needed for discomfort, (do not use more than 10 days in a row) 30 g 0   ketoconazole (NIZORAL) 2 % cream Apply under breasts twice daily until rash cleared. May use once a day for maintenance. 60 g 3   LUTEIN PO Take 1 tablet by mouth daily.      Pertinent medications related to GI and procedure were reviewed by me with the patient prior to the procedure   Current Facility-Administered Medications:    0.9 %  sodium chloride infusion, , Intravenous, Continuous, [name], DO  sodium chloride         Allergies  Allergen Reactions   Alendronate Sodium     REACTION: GI   Ceftin [Cefuroxime]    Ciprofloxacin     Nausea (of note was taking with flagyl)    Clarithromycin     REACTION: stomach upset   Codeine     REACTION: nausea and vomiting   Diclofenac Sodium     REACTION: hands itching   Fish Oil     REACTION: reflux   Flagyl [Metronidazole]     Nausea (of note was taking with cipro)    Meloxicam Itching    Hands itched. And urine was rusty colored.   Penicillins     REACTION: mouth swelling   Prolia [Denosumab]    Rabeprazole Sodium     REACTION: GI   Raloxifene     REACTION: leg pain and cramps   Sulfonamide Derivatives     REACTION: rash   Azithromycin Diarrhea    REACTION: GI upset   Sulfa Antibiotics Rash    REACTION: rash   Allergies were reviewed by me prior to the procedure  Objective   Body mass index is 31.09 kg/m. Vitals:   01/23/22 0704  BP: (!) 156/80  Pulse: 78  Resp: 16  Temp: (!) 97.5 F (36.4 C)  TempSrc: Temporal  SpO2: 99%  Weight: 77.1 kg  Height: '5\' 2"'$  (1.575 m)     Physical Exam Vitals and nursing note reviewed.  Constitutional:      General: She is not in acute distress.    Appearance: Normal appearance. She is not  ill-appearing, toxic-appearing or diaphoretic.  HENT:     Head: Normocephalic and atraumatic.     Nose: Nose normal.     Mouth/Throat:     Mouth: Mucous membranes are moist.     Pharynx: Oropharynx is clear.  Eyes:     General: No scleral icterus.    Extraocular Movements: Extraocular movements intact.  Cardiovascular:     Rate and Rhythm: Normal rate and regular rhythm.     Heart sounds: Normal heart sounds. No murmur heard.  No friction rub. No gallop.  Pulmonary:     Effort: Pulmonary effort is normal. No respiratory distress.     Breath sounds: Normal breath sounds. No wheezing, rhonchi or rales.  Abdominal:     General: Bowel sounds are normal. There is no distension.     Palpations: Abdomen is soft.     Tenderness: There is no abdominal tenderness. There is no guarding or rebound.  Musculoskeletal:     Cervical back: Neck supple.     Right lower leg: No edema.     Left lower leg: No edema.  Skin:    General: Skin is warm and dry.     Coloration: Skin is not jaundiced or pale.  Neurological:     General: No focal deficit present.     Mental Status: She is alert and oriented to person, place, and time. Mental status is at baseline.  Psychiatric:        Mood and Affect: Mood normal.        Behavior: Behavior normal.        Thought Content: Thought content normal.        Judgment: Judgment normal.     Assessment:  Ms. Panzy Bubeck is a 76 y.o. female  who presents today for Esophagogastroduodenoscopy and Colonoscopy for chronic constipation; ruq gastritis/duodenitis.  Plan:  Esophagogastroduodenoscopy and Colonoscopy with possible intervention today  Esophagogastroduodenoscopy and Colonoscopy with possible biopsy, control of bleeding, polypectomy, and interventions as necessary has been discussed with the patient/patient representative. Informed consent was obtained from the patient/patient representative after explaining the indication, nature, and risks of  the procedure including but not limited to death, bleeding, perforation, missed neoplasm/lesions, cardiorespiratory compromise, and reaction to medications. Opportunity for questions was given and appropriate answers were provided. Patient/patient representative has verbalized understanding is amenable to undergoing the procedure.   [name], DO  [facility]  Portions of the record may have been created with voice recognition software. Occasional wrong-word or 'sound-a-like' substitutions may have occurred due to the inherent limitations of voice recognition software.  Read the chart carefully and recognize, using context, where substitutions may have occurred.

## 2022-01-24 ENCOUNTER — Encounter: Payer: Self-pay | Admitting: Gastroenterology

## 2022-01-24 LAB — SURGICAL PATHOLOGY

## 2022-01-28 ENCOUNTER — Other Ambulatory Visit: Payer: Self-pay | Admitting: Family Medicine

## 2022-01-31 NOTE — Anesthesia Postprocedure Evaluation (Signed)
Anesthesia Post Note  Patient: Jody Avery  Procedure(s) Performed: COLONOSCOPY ESOPHAGOGASTRODUODENOSCOPY (EGD)  Patient location during evaluation: PACU Anesthesia Type: General Level of consciousness: awake and oriented Pain management: pain level controlled Vital Signs Assessment: post-procedure vital signs reviewed and stable Respiratory status: spontaneous breathing and respiratory function stable Cardiovascular status: stable Anesthetic complications: no   No notable events documented.   Last Vitals:  Vitals:   01/23/22 0842 01/23/22 0852  BP: 128/75 (!) 146/68  Pulse: 75 78  Resp: 15 15  Temp:    SpO2: 97% 97%    Last Pain:  Vitals:   01/24/22 0758  TempSrc:   PainSc: 0-No pain                 VAN STAVEREN,[name]

## 2022-07-17 ENCOUNTER — Other Ambulatory Visit: Payer: Self-pay | Admitting: Internal Medicine

## 2022-07-17 DIAGNOSIS — Z1231 Encounter for screening mammogram for malignant neoplasm of breast: Secondary | ICD-10-CM

## 2022-08-20 ENCOUNTER — Ambulatory Visit: Payer: Medicare HMO | Admitting: Dermatology

## 2022-08-28 ENCOUNTER — Ambulatory Visit (INDEPENDENT_AMBULATORY_CARE_PROVIDER_SITE_OTHER): Payer: Medicare HMO | Admitting: Dermatology

## 2022-08-28 DIAGNOSIS — B079 Viral wart, unspecified: Secondary | ICD-10-CM

## 2022-08-28 DIAGNOSIS — Z7189 Other specified counseling: Secondary | ICD-10-CM

## 2022-08-28 DIAGNOSIS — R208 Other disturbances of skin sensation: Secondary | ICD-10-CM | POA: Diagnosis not present

## 2022-08-28 NOTE — Progress Notes (Signed)
   Follow-Up Visit   Subjective  Jody Avery is a 76 y.o. female who presents for the following: Warts (Patient here today for wart at left foot, one at lateral plantar foot previously treated with LN2. Painful and getting larger.).   The following portions of the chart were reviewed this encounter and updated as appropriate:       Review of Systems:  No other skin or systemic complaints except as noted in HPI or Assessment and Plan.  Objective  Well appearing patient in no apparent distress; mood and affect are within normal limits.  A focused examination was performed including left foot. Relevant physical exam findings are noted in the Assessment and Plan.  left lateral plantar foot 4 x 3 keratotic, tender papule    Assessment & Plan  Viral warts, unspecified type left lateral plantar foot  With skin pain  Viral Wart (HPV) Counseling  Discussed viral / HPV (Human Papilloma Virus) etiology and risk of spread /infectivity to other areas of body as well as to other people.  Multiple treatments and methods may be required to clear warts and it is possible treatment may not be successful.  Treatment risks include discoloration; scarring and there is still potential for wart recurrence.  Squaric Acid 3% applied to warts today. Prior to application reviewed risk of inflammation and irritation.   Destruction of lesion - left lateral plantar foot  Destruction method: cryotherapy   Informed consent: discussed and consent obtained   Timeout:  patient name, date of birth, surgical site, and procedure verified Lesion destroyed using liquid nitrogen: Yes   Region frozen until ice ball extended beyond lesion: Yes   Outcome: patient tolerated procedure well with no complications   Post-procedure details: wound care instructions given   Additional details:  Prior to procedure, discussed risks of blister formation, small wound, skin dyspigmentation, or rare scar following  cryotherapy. Recommend Vaseline ointment to treated areas while healing.   Destruction of lesion - left lateral plantar foot  Destruction method: chemical removal   Informed consent: discussed and consent obtained   Timeout:  patient name, date of birth, surgical site, and procedure verified Chemical destruction method comment:  Cantharidin plus, squaric acid Application time:  6 hours Procedure instructions: patient instructed to wash and dry area   Outcome: patient tolerated procedure well with no complications   Post-procedure details: wound care instructions given   Additional details:  Cantharidin Plus is a blistering agent that comes from a beetle.  It needs to be washed off in about 4-6 hours after application.  Although it is painless when applied in office, it may cause symptoms of mild pain and burning several hours later.  Treated areas will swell and turn red, and blisters may form.  Vaseline and a bandaid may be applied until wound has healed.  Once healed, the skin may remain temporarily discolored.  It can take weeks to months for pigmentation to return to normal.  Advised to wash off with soap and water in 4-6 hours or sooner if it becomes tender before then.   Skin pain   Return in about 4 weeks (around 09/25/2022) for Warts.  Graciella Belton, RMA, am acting as scribe for Brendolyn Patty, MD .  Documentation: I have reviewed the above documentation for accuracy and completeness, and I agree with the above.  Brendolyn Patty MD

## 2022-08-28 NOTE — Patient Instructions (Addendum)
Cryotherapy Aftercare  Wash gently with soap and water everyday.   Apply Vaseline and Band-Aid daily until healed.   Instructions for After In-Office Application of Cantharidin  1. This is a strong medicine; please follow ALL instructions.  2. Gently wash off with soap and water in four to six hours or sooner s directed by your physician.  3. **WARNING** this medicine can cause severe blistering, blood blisters, infection, and/or scarring if it is not washed off as directed.  4. Your progress will be rechecked in 1-2 months; call sooner if there are any questions or problems.  Cantharidin Plus is a blistering agent that comes from a beetle.  It needs to be washed off in about 4 hours after application.  Although it is painless when applied in office, it may cause symptoms of mild pain and burning several hours later.  Treated areas will swell and turn red, and blisters may form.  Vaseline and a bandaid may be applied until wound has healed.  Once healed, the skin may remain temporarily discolored.  It can take weeks to months for pigmentation to return to normal.  Advised to wash off with soap and water in 4 - 6 hours or sooner if it becomes tender before then.   Due to recent changes in healthcare laws, you may see results of your pathology and/or laboratory studies on MyChart before the doctors have had a chance to review them. We understand that in some cases there may be results that are confusing or concerning to you. Please understand that not all results are received at the same time and often the doctors may need to interpret multiple results in order to provide you with the best plan of care or course of treatment. Therefore, we ask that you please give [place] 2 business days to thoroughly review all your results before contacting the office for clarification. Should we see a critical lab result, you will be contacted sooner.   If You Need Anything After Your Visit  If you have any  questions or concerns for your doctor, please call our main line at [phone] and press option 4 to reach your doctor's medical assistant. If no one answers, please leave a voicemail as directed and we will return your call as soon as possible. Messages left after 4 pm will be answered the following business day.   You may also send [place] a message via [place]. We typically respond to MyChart messages within 1-2 business days.  For prescription refills, please ask your pharmacy to contact our office. Our fax number is [phone].  If you have an urgent issue when the clinic is closed that cannot wait until the next business day, you can page your doctor at the number below.    Please note that while we do our best to be available for urgent issues outside of office hours, we are not available 24/7.   If you have an urgent issue and are unable to reach [place], you may choose to seek medical care at your doctor's office, retail clinic, urgent care center, or emergency room.  If you have a medical emergency, please immediately call 911 or go to the emergency department.  Pager Numbers  - Dr. Nehemiah Massed: [phone]  - Dr. Laurence Ferrari: [phone]  - Dr. Nicole Kindred: [phone]  In the event of inclement weather, please call our main line at [phone] for an update on the status of any delays or closures.  Dermatology Medication Tips: Please keep the boxes that  topical medications come in in order to help keep track of the instructions about where and how to use these. Pharmacies typically print the medication instructions only on the boxes and not directly on the medication tubes.   If your medication is too expensive, please contact our office at [phone] option 4 or send [place] a message through [place].   We are unable to tell what your co-pay for medications will be in advance as this is different depending on your insurance coverage. However, we may be able to find a substitute medication at  lower cost or fill out paperwork to get insurance to cover a needed medication.   If a prior authorization is required to get your medication covered by your insurance company, please allow [place] 1-2 business days to complete this process.  Drug prices often vary depending on where the prescription is filled and some pharmacies may offer cheaper prices.  The website www.goodrx.com contains coupons for medications through different pharmacies. The prices here do not account for what the cost may be with help from insurance (it may be cheaper with your insurance), but the website can give you the price if you did not use any insurance.  - You can print the associated coupon and take it with your prescription to the pharmacy.  - You may also stop by our office during regular business hours and pick up a GoodRx coupon card.  - If you need your prescription sent electronically to a different pharmacy, notify our office through [facility] or by phone at [phone] option 4.     Si Usted Necesita Algo Despus de Su Visita  Tambin puede enviarnos un mensaje a travs de Pharmacist, community. Por lo general respondemos a los mensajes de MyChart en el transcurso de 1 a 2 das hbiles.  Para renovar recetas, por favor pida a su farmacia que se ponga en contacto con nuestra oficina. Harland Dingwall de fax es [place] [phone].  Si tiene un asunto urgente cuando la clnica est cerrada y que no puede esperar hasta el siguiente da hbil, puede llamar/localizar a su doctor(a) al nmero que aparece a continuacin.   Por favor, tenga en cuenta que aunque hacemos todo lo posible para estar disponibles para asuntos urgentes fuera del horario de [place], no estamos disponibles las 24 horas del da, los 7 das de la [place].   Si tiene un problema urgente y no puede comunicarse con nosotros, puede optar por buscar atencin mdica  en el consultorio de su doctor(a), en una clnica privada, en un centro de atencin urgente  o en una sala de emergencias.  Si tiene Engineering geologist, por favor llame inmediatamente al 911 o vaya a la sala de emergencias.  Nmeros de bper  - Dr. Nehemiah Massed: [phone]  - Dra. Moye: [phone]  - Dra. Nicole Kindred: [phone]  En caso de inclemencias del [place], por favor llame a Johnsie Kindred principal al [phone] para una actualizacin sobre el [place] de cualquier retraso o cierre.  Consejos para la medicacin en dermatologa: Por favor, guarde las cajas en las que vienen los medicamentos de uso tpico para ayudarle a seguir las instrucciones sobre dnde y cmo usarlos. Las farmacias generalmente imprimen las instrucciones del medicamento slo en las cajas y no directamente en los tubos del [place].   Si su medicamento es muy caro, por favor, pngase en contacto con Zigmund Daniel llamando al [phone] y presione la opcin 4 o envenos un mensaje a travs de Pharmacist, community.   No  podemos decirle cul ser su copago por los medicamentos por adelantado ya que esto es diferente dependiendo de la cobertura de su seguro. Sin embargo, es posible que podamos encontrar un medicamento sustituto a Electrical engineer un formulario para que el seguro cubra el medicamento que se considera necesario.   Si se requiere una autorizacin previa para que su compaa de seguros [place] su medicamento, por favor permtanos de 1 a 2 das hbiles para completar este proceso.  Los precios de los medicamentos varan con frecuencia dependiendo del Environmental consultant de dnde se surte la receta y alguna farmacias pueden ofrecer precios ms baratos.  El sitio web www.goodrx.com tiene cupones para medicamentos de Airline pilot. Los precios aqu no tienen en cuenta lo que podra costar con la ayuda del seguro (puede ser ms barato con su seguro), pero el sitio web puede darle el precio si no utiliz Research scientist (physical sciences).  - Puede imprimir el cupn correspondiente y llevarlo con su receta a la farmacia.  - Tambin  puede pasar por nuestra oficina durante el horario de atencin regular y Charity fundraiser una tarjeta de cupones de GoodRx.  - Si necesita que su receta se enve electrnicamente a una farmacia diferente, informe a nuestra oficina a travs de MyChart de [place] o por telfono llamando al [phone] y presione la opcin 4.

## 2022-09-16 ENCOUNTER — Ambulatory Visit: Payer: Medicare HMO

## 2022-09-24 ENCOUNTER — Ambulatory Visit: Payer: Medicare HMO | Admitting: Dermatology

## 2022-09-24 DIAGNOSIS — B070 Plantar wart: Secondary | ICD-10-CM | POA: Diagnosis not present

## 2022-09-24 NOTE — Progress Notes (Signed)
   Follow-Up Visit   Subjective  Jody Avery is a 77 y.o. female who presents for the following: Follow-up.  Patient presents for 1 month follow-up wart of the left lateral plantar foot. She still has some pain in this area. Area was treated with cryotherapy, Catharidin Plus, and Squaric acid 3%.  She had severe reaction with a lot of pain.  The following portions of the chart were reviewed this encounter and updated as appropriate:       Review of Systems:  No other skin or systemic complaints except as noted in HPI or Assessment and Plan.  Objective  Well appearing patient in no apparent distress; mood and affect are within normal limits.  A focused examination was performed including left foot. Relevant physical exam findings are noted in the Assessment and Plan.  Left Lateral Plantar Foot 3.0 x 2.0 mm thin verrucous papule    Assessment & Plan  Plantar wart Left Lateral Plantar Foot  Viral Wart (HPV) Counseling  Discussed viral / HPV (Human Papilloma Virus) etiology and risk of spread /infectivity to other areas of body as well as to other people.  Multiple treatments and methods may be required to clear warts and it is possible treatment may not be successful.  Treatment risks include discoloration; scarring and there is still potential for wart recurrence.  Improving.  Cryotherapy only today.  Destruction of lesion - Left Lateral Plantar Foot  Destruction method: cryotherapy   Informed consent: discussed and consent obtained   Lesion destroyed using liquid nitrogen: Yes   Region frozen until ice ball extended beyond lesion: Yes   Outcome: patient tolerated procedure well with no complications   Post-procedure details: wound care instructions given   Additional details:  Prior to procedure, discussed risks of blister formation, small wound, skin dyspigmentation, or rare scar following cryotherapy. Recommend Vaseline ointment to treated areas while  healing.    Return as scheduled, for TBSE, recheck wart.  IJamesetta Orleans, CMA, am acting as scribe for Brendolyn Patty, MD .  Documentation: I have reviewed the above documentation for accuracy and completeness, and I agree with the above.  Brendolyn Patty MD

## 2022-09-24 NOTE — Patient Instructions (Addendum)
Viral Wart (HPV) Counseling  Discussed viral / HPV (Human Papilloma Virus) etiology and risk of spread /infectivity to other areas of body as well as to other people.  Multiple treatments and methods may be required to clear warts and it is possible treatment may not be successful.  Treatment risks include discoloration; scarring and there is still potential for wart recurrence.  Recommend using Curad Mediplast pads. Cut to fit wart or callus. Cover with Elastoplast waterproof tape or any waterproof band-aid. Change every 3 to 4 days, or sooner if necessary.  Treatment may require several months of regular use before results are seen.   Due to recent changes in healthcare laws, you may see results of your pathology and/or laboratory studies on MyChart before the doctors have had a chance to review them. We understand that in some cases there may be results that are confusing or concerning to you. Please understand that not all results are received at the same time and often the doctors may need to interpret multiple results in order to provide you with the best plan of care or course of treatment. Therefore, we ask that you please give [place] 2 business days to thoroughly review all your results before contacting the office for clarification. Should we see a critical lab result, you will be contacted sooner.   If You Need Anything After Your Visit  If you have any questions or concerns for your doctor, please call our main line at [phone] and press option 4 to reach your doctor's medical assistant. If no one answers, please leave a voicemail as directed and we will return your call as soon as possible. Messages left after 4 pm will be answered the following business day.   You may also send [place] a message via [place]. We typically respond to MyChart messages within 1-2 business days.  For prescription refills, please ask your pharmacy to contact our office. Our fax number is [phone].  If you  have an urgent issue when the clinic is closed that cannot wait until the next business day, you can page your doctor at the number below.    Please note that while we do our best to be available for urgent issues outside of office hours, we are not available 24/7.   If you have an urgent issue and are unable to reach [place], you may choose to seek medical care at your doctor's office, retail clinic, urgent care center, or emergency room.  If you have a medical emergency, please immediately call 911 or go to the emergency department.  Pager Numbers  - Dr. Nehemiah Massed: [phone]  - Dr. Laurence Ferrari: [phone]  - Dr. Nicole Kindred: [phone]  In the event of inclement weather, please call our main line at [phone] for an update on the status of any delays or closures.  Dermatology Medication Tips: Please keep the boxes that topical medications come in in order to help keep track of the instructions about where and how to use these. Pharmacies typically print the medication instructions only on the boxes and not directly on the medication tubes.   If your medication is too expensive, please contact our office at [phone] option 4 or send [place] a message through [place].   We are unable to tell what your co-pay for medications will be in advance as this is different depending on your insurance coverage. However, we may be able to find a substitute medication at lower cost or fill out paperwork to get insurance to cover a  needed medication.   If a prior authorization is required to get your medication covered by your insurance company, please allow [place] 1-2 business days to complete this process.  Drug prices often vary depending on where the prescription is filled and some pharmacies may offer cheaper prices.  The website www.goodrx.com contains coupons for medications through different pharmacies. The prices here do not account for what the cost may be with help from insurance (it may be cheaper  with your insurance), but the website can give you the price if you did not use any insurance.  - You can print the associated coupon and take it with your prescription to the pharmacy.  - You may also stop by our office during regular business hours and pick up a GoodRx coupon card.  - If you need your prescription sent electronically to a different pharmacy, notify our office through [facility] or by phone at [phone] option 4.     Si Usted Necesita Algo Despus de Su Visita  Tambin puede enviarnos un mensaje a travs de Pharmacist, community. Por lo general respondemos a los mensajes de MyChart en el transcurso de 1 a 2 das hbiles.  Para renovar recetas, por favor pida a su farmacia que se ponga en contacto con nuestra oficina. Harland Dingwall de fax es [place] [phone].  Si tiene un asunto urgente cuando la clnica est cerrada y que no puede esperar hasta el siguiente da hbil, puede llamar/localizar a su doctor(a) al nmero que aparece a continuacin.   Por favor, tenga en cuenta que aunque hacemos todo lo posible para estar disponibles para asuntos urgentes fuera del horario de [place], no estamos disponibles las 24 horas del da, los 7 das de la [place].   Si tiene un problema urgente y no puede comunicarse con nosotros, puede optar por buscar atencin mdica  en el consultorio de su doctor(a), en una clnica privada, en un centro de atencin urgente o en una sala de emergencias.  Si tiene Engineering geologist, por favor llame inmediatamente al 911 o vaya a la sala de emergencias.  Nmeros de bper  - Dr. Nehemiah Massed: [phone]  - Dra. Moye: [phone]  - Dra. Nicole Kindred: [phone]  En caso de inclemencias del [place], por favor llame a Johnsie Kindred principal al [phone] para una actualizacin sobre el [place] de cualquier retraso o cierre.  Consejos para la medicacin en dermatologa: Por favor, guarde las cajas en las que vienen los medicamentos de uso tpico para  ayudarle a seguir las instrucciones sobre dnde y cmo usarlos. Las farmacias generalmente imprimen las instrucciones del medicamento slo en las cajas y no directamente en los tubos del [place].   Si su medicamento es muy caro, por favor, pngase en contacto con Zigmund Daniel llamando al [phone] y presione la opcin 4 o envenos un mensaje a travs de Pharmacist, community.   No podemos decirle cul ser su copago por los medicamentos por adelantado ya que esto es diferente dependiendo de la cobertura de su seguro. Sin embargo, es posible que podamos encontrar un medicamento sustituto a Electrical engineer un formulario para que el seguro cubra el medicamento que se considera necesario.   Si se requiere una autorizacin previa para que su compaa de seguros [place] su medicamento, por favor permtanos de 1 a 2 das hbiles para completar este proceso.  Los precios de los medicamentos varan con frecuencia dependiendo del Environmental consultant de dnde se surte la receta y alguna farmacias pueden ofrecer precios ms baratos.  El sitio web www.goodrx.com tiene cupones para medicamentos de Airline pilot. Los precios aqu no tienen en cuenta lo que podra costar con la ayuda del seguro (puede ser ms barato con su seguro), pero el sitio web puede darle el precio si no utiliz Research scientist (physical sciences).  - Puede imprimir el cupn correspondiente y llevarlo con su receta a la farmacia.  - Tambin puede pasar por nuestra oficina durante el horario de atencin regular y Charity fundraiser una tarjeta de cupones de GoodRx.  - Si necesita que su receta se enve electrnicamente a una farmacia diferente, informe a nuestra oficina a travs de MyChart de [place] o por telfono llamando al [phone] y presione la opcin 4.

## 2022-09-26 ENCOUNTER — Ambulatory Visit
Admission: RE | Admit: 2022-09-26 | Discharge: 2022-09-26 | Disposition: A | Discharge status: Home / Self Care | Payer: Medicare HMO | Source: Ambulatory Visit | Attending: Internal Medicine | Admitting: Internal Medicine

## 2022-09-26 DIAGNOSIS — Z1231 Encounter for screening mammogram for malignant neoplasm of breast: Secondary | ICD-10-CM

## 2022-10-20 ENCOUNTER — Other Ambulatory Visit: Payer: Self-pay | Admitting: Internal Medicine

## 2022-10-20 DIAGNOSIS — R42 Dizziness and giddiness: Secondary | ICD-10-CM

## 2022-10-22 ENCOUNTER — Ambulatory Visit
Admission: RE | Admit: 2022-10-22 | Discharge: 2022-10-22 | Disposition: A | Discharge status: Home / Self Care | Payer: Medicare HMO | Source: Ambulatory Visit | Attending: Internal Medicine | Admitting: Internal Medicine

## 2022-10-22 DIAGNOSIS — R42 Dizziness and giddiness: Secondary | ICD-10-CM | POA: Insufficient documentation

## 2022-10-28 ENCOUNTER — Ambulatory Visit (INDEPENDENT_AMBULATORY_CARE_PROVIDER_SITE_OTHER): Payer: Medicare HMO | Admitting: Dermatology

## 2022-10-28 DIAGNOSIS — L720 Epidermal cyst: Secondary | ICD-10-CM | POA: Diagnosis not present

## 2022-10-28 DIAGNOSIS — D1801 Hemangioma of skin and subcutaneous tissue: Secondary | ICD-10-CM

## 2022-10-28 DIAGNOSIS — Z1283 Encounter for screening for malignant neoplasm of skin: Secondary | ICD-10-CM

## 2022-10-28 DIAGNOSIS — L820 Inflamed seborrheic keratosis: Secondary | ICD-10-CM | POA: Diagnosis not present

## 2022-10-28 DIAGNOSIS — L814 Other melanin hyperpigmentation: Secondary | ICD-10-CM

## 2022-10-28 DIAGNOSIS — B078 Other viral warts: Secondary | ICD-10-CM | POA: Diagnosis not present

## 2022-10-28 DIAGNOSIS — L738 Other specified follicular disorders: Secondary | ICD-10-CM

## 2022-10-28 DIAGNOSIS — Z85828 Personal history of other malignant neoplasm of skin: Secondary | ICD-10-CM

## 2022-10-28 DIAGNOSIS — Z86007 Personal history of in-situ neoplasm of skin: Secondary | ICD-10-CM

## 2022-10-28 DIAGNOSIS — D229 Melanocytic nevi, unspecified: Secondary | ICD-10-CM

## 2022-10-28 DIAGNOSIS — L821 Other seborrheic keratosis: Secondary | ICD-10-CM

## 2022-10-28 NOTE — Patient Instructions (Addendum)
Cryotherapy Aftercare  Wash gently with soap and water everyday.   Apply Vaseline and Band-Aid daily until healed.     Recommend daily broad spectrum sunscreen SPF 30+ to sun-exposed areas, reapply every 2 hours as needed. Call for new or changing lesions.  Staying in the shade or wearing long sleeves, sun glasses (UVA+UVB protection) and wide brim hats (4-inch brim around the entire circumference of the hat) are also recommended for sun protection.      Recommend starting moisturizer with exfoliant (Urea, Salicylic acid, or Lactic acid) one to two times daily to help smooth rough and bumpy skin.  OTC options include Cetaphil Rough and Bumpy lotion (Urea), Eucerin Roughness Relief lotion or spot treatment cream (Urea), CeraVe SA lotion/cream for Rough and Bumpy skin (Sal Acid), Gold Bond Rough and Bumpy cream (Sal Acid), and AmLactin 12% lotion/cream (Lactic Acid).  If applying in morning, also apply sunscreen to sun-exposed areas, since these exfoliating moisturizers can increase sensitivity to sun.      Due to recent changes in healthcare laws, you may see results of your pathology and/or laboratory studies on MyChart before the doctors have had a chance to review them. We understand that in some cases there may be results that are confusing or concerning to you. Please understand that not all results are received at the same time and often the doctors may need to interpret multiple results in order to provide you with the best plan of care or course of treatment. Therefore, we ask that you please give [place] 2 business days to thoroughly review all your results before contacting the office for clarification. Should we see a critical lab result, you will be contacted sooner.   If You Need Anything After Your Visit  If you have any questions or concerns for your doctor, please call our main line at [phone] and press option 4 to reach your doctor's medical assistant. If no one answers, please  leave a voicemail as directed and we will return your call as soon as possible. Messages left after 4 pm will be answered the following business day.   You may also send [place] a message via [place]. We typically respond to MyChart messages within 1-2 business days.  For prescription refills, please ask your pharmacy to contact our office. Our fax number is [phone].  If you have an urgent issue when the clinic is closed that cannot wait until the next business day, you can page your doctor at the number below.    Please note that while we do our best to be available for urgent issues outside of office hours, we are not available 24/7.   If you have an urgent issue and are unable to reach [place], you may choose to seek medical care at your doctor's office, retail clinic, urgent care center, or emergency room.  If you have a medical emergency, please immediately call 911 or go to the emergency department.  Pager Numbers  - Dr. Nehemiah Massed: [phone]  - Dr. Laurence Ferrari: [phone]  - Dr. Nicole Kindred: [phone]  In the event of inclement weather, please call our main line at [phone] for an update on the status of any delays or closures.  Dermatology Medication Tips: Please keep the boxes that topical medications come in in order to help keep track of the instructions about where and how to use these. Pharmacies typically print the medication instructions only on the boxes and not directly on the medication tubes.   If your medication is too  expensive, please contact our office at [phone] option 4 or send [place] a message through [place].   We are unable to tell what your co-pay for medications will be in advance as this is different depending on your insurance coverage. However, we may be able to find a substitute medication at lower cost or fill out paperwork to get insurance to cover a needed medication.   If a prior authorization is required to get your medication covered by your insurance  company, please allow [place] 1-2 business days to complete this process.  Drug prices often vary depending on where the prescription is filled and some pharmacies may offer cheaper prices.  The website www.goodrx.com contains coupons for medications through different pharmacies. The prices here do not account for what the cost may be with help from insurance (it may be cheaper with your insurance), but the website can give you the price if you did not use any insurance.  - You can print the associated coupon and take it with your prescription to the pharmacy.  - You may also stop by our office during regular business hours and pick up a GoodRx coupon card.  - If you need your prescription sent electronically to a different pharmacy, notify our office through [facility] or by phone at [phone] option 4.     Si Usted Necesita Algo Despus de Su Visita  Tambin puede enviarnos un mensaje a travs de Pharmacist, community. Por lo general respondemos a los mensajes de MyChart en el transcurso de 1 a 2 das hbiles.  Para renovar recetas, por favor pida a su farmacia que se ponga en contacto con nuestra oficina. Harland Dingwall de fax es [place] [phone].  Si tiene un asunto urgente cuando la clnica est cerrada y que no puede esperar hasta el siguiente da hbil, puede llamar/localizar a su doctor(a) al nmero que aparece a continuacin.   Por favor, tenga en cuenta que aunque hacemos todo lo posible para estar disponibles para asuntos urgentes fuera del horario de [place], no estamos disponibles las 24 horas del da, los 7 das de la [place].   Si tiene un problema urgente y no puede comunicarse con nosotros, puede optar por buscar atencin mdica  en el consultorio de su doctor(a), en una clnica privada, en un centro de atencin urgente o en una sala de emergencias.  Si tiene Engineering geologist, por favor llame inmediatamente al 911 o vaya a la sala de emergencias.  Nmeros de bper  - Dr.  Nehemiah Massed: [phone]  - Dra. Moye: [phone]  - Dra. Nicole Kindred: [phone]  En caso de inclemencias del [place], por favor llame a Johnsie Kindred principal al [phone] para una actualizacin sobre el [place] de cualquier retraso o cierre.  Consejos para la medicacin en dermatologa: Por favor, guarde las cajas en las que vienen los medicamentos de uso tpico para ayudarle a seguir las instrucciones sobre dnde y cmo usarlos. Las farmacias generalmente imprimen las instrucciones del medicamento slo en las cajas y no directamente en los tubos del [place].   Si su medicamento es muy caro, por favor, pngase en contacto con Zigmund Daniel llamando al [phone] y presione la opcin 4 o envenos un mensaje a travs de Pharmacist, community.   No podemos decirle cul ser su copago por los medicamentos por adelantado ya que esto es diferente dependiendo de la cobertura de su seguro. Sin embargo, es posible que podamos encontrar un medicamento sustituto a Electrical engineer un formulario para que el seguro  cubra el medicamento que se considera necesario.   Si se requiere una autorizacin previa para que su compaa de seguros [place] su medicamento, por favor permtanos de 1 a 2 das hbiles para completar este proceso.  Los precios de los medicamentos varan con frecuencia dependiendo del Environmental consultant de dnde se surte la receta y alguna farmacias pueden ofrecer precios ms baratos.  El sitio web www.goodrx.com tiene cupones para medicamentos de Airline pilot. Los precios aqu no tienen en cuenta lo que podra costar con la ayuda del seguro (puede ser ms barato con su seguro), pero el sitio web puede darle el precio si no utiliz Research scientist (physical sciences).  - Puede imprimir el cupn correspondiente y llevarlo con su receta a la farmacia.  - Tambin puede pasar por nuestra oficina durante el horario de atencin regular y Charity fundraiser una tarjeta de cupones de GoodRx.  - Si necesita que su receta se enve  electrnicamente a una farmacia diferente, informe a nuestra oficina a travs de MyChart de [place] o por telfono llamando al [phone] y presione la opcin 4.

## 2022-10-28 NOTE — Progress Notes (Signed)
Follow-Up Visit   Subjective  Jody Avery is a 77 y.o. female who presents for the following: Annual Exam. Hx of BCC, hx of SCCis  The patient presents for Total-Body Skin Exam (TBSE) for skin cancer screening and mole check.  The patient has spots, moles and lesions to be evaluated, some may be new or changing and the patient has concerns that these could be cancer. She has a dark spot on her R thigh that is new and bothersome.  She also has other scaly growths on her arms, chest, and leg that she picks at. 1 month f/u on a wart on the left foot treated with LN2 1 month ago and using otc salicylic patches with a good response.   The following portions of the chart were reviewed this encounter and updated as appropriate:       Review of Systems:  No other skin or systemic complaints except as noted in HPI or Assessment and Plan.  Objective  Well appearing patient in no apparent distress; mood and affect are within normal limits.  A full examination was performed including scalp, head, eyes, ears, nose, lips, neck, chest, axillae, abdomen, back, buttocks, bilateral upper extremities, bilateral lower extremities, hands, feet, fingers, toes, fingernails, and toenails. All findings within normal limits unless otherwise noted below.  left lateral plantar foot Clear of wart   right dorsal 3rd toe 4.0 mm light brown macule   right medial upper thigh flesh nodule with large dark punctum.   Right Upper Arm x 1, left clavicle x 4, left popliteal x 1, right forearm x 2 (8) Stuck-on, waxy, tan-brown papule--Discussed benign etiology and prognosis.   left chin, right postauricular Smooth white papule(s).    Assessment & Plan  Other viral warts left lateral plantar foot  Resolved. Observe.  If any sign of recurrence, may restart otc SA wart patches   Nevus right dorsal 3rd toe  Benign-appearing.  Observation.  Call clinic for new or changing lesions.  Recommend daily use of  broad spectrum spf 30+ sunscreen to sun-exposed areas.    Epidermal inclusion cyst right medial upper thigh  Benign-appearing. Exam most consistent with an epidermal inclusion cyst. Discussed that a cyst is a benign growth that can grow over time and sometimes get irritated or inflamed.   Symptomatic, irritating, patient would like treated.  Since bothersome to patient, cyst extracted in the office today.   Acne/Milia surgery - right medial upper thigh Procedure risks and benefits were discussed with the patient and verbal consent was obtained. Following prep of the skin on the R medial thigh with an alcohol swab, extraction of cyst was performed with two Q tips. Vaseline ointment was applied to each site. The patient tolerated the procedure well.  Inflamed seborrheic keratosis (8) Right Upper Arm x 1, left clavicle x 4, left popliteal x 1, right forearm x 2  Symptomatic, irritating, patient would like treated.   Destruction of lesion - Right Upper Arm x 1, left clavicle x 4, left popliteal x 1, right forearm x 2  Milia left chin, right postauricular  Benign, Observe.  Can start samples of Differin gel 0.1% daily  Discussed extraction if irritated    Lentigines - Scattered tan macules - Due to sun exposure - Benign-appearing, observe - Recommend daily broad spectrum sunscreen SPF 30+ to sun-exposed areas, reapply every 2 hours as needed. - Call for any changes  Seborrheic Keratoses - Stuck-on, waxy, tan-brown papules and/or plaques  - Benign-appearing -  Discussed benign etiology and prognosis. - Observe - Call for any changes  Hemangiomas - Red papules - Discussed benign nature - Observe - Call for any changes  Actinic Damage - Chronic condition, secondary to cumulative UV/sun exposure - diffuse scaly erythematous macules with underlying dyspigmentation - Recommend daily broad spectrum sunscreen SPF 30+ to sun-exposed areas, reapply every 2 hours as needed.  -  Staying in the shade or wearing long sleeves, sun glasses (UVA+UVB protection) and wide brim hats (4-inch brim around the entire circumference of the hat) are also recommended for sun protection.  - Call for new or changing lesions.  Sebaceous Hyperplasia Forehead  - Small yellow papules with a central dell - Benign - Observe   History of Basal Cell Carcinoma of the Skin Right lower pretibial - No evidence of recurrence today - Recommend regular full body skin exams - Recommend daily broad spectrum sunscreen SPF 30+ to sun-exposed areas, reapply every 2 hours as needed.  - Call if any new or changing lesions are noted between office visits   History of Squamous Cell Carcinoma in Situ of the Skin Left dorsal hand  - No evidence of recurrence today - Recommend regular full body skin exams - Recommend daily broad spectrum sunscreen SPF 30+ to sun-exposed areas, reapply every 2 hours as needed.  - Call if any new or changing lesions are noted between office visits  Skin cancer screening performed today.   Return in about 1 year (around 10/29/2023) for TBSE, hx of BCC, hx of SCCis .  I, Marye Round, CMA, am acting as scribe for Brendolyn Patty, MD .   Documentation: I have reviewed the above documentation for accuracy and completeness, and I agree with the above.  Brendolyn Patty MD

## 2023-06-03 ENCOUNTER — Ambulatory Visit (INDEPENDENT_AMBULATORY_CARE_PROVIDER_SITE_OTHER): Payer: Medicare HMO | Admitting: Dermatology

## 2023-06-03 VITALS — BP 144/76 | HR 75

## 2023-06-03 DIAGNOSIS — W908XXA Exposure to other nonionizing radiation, initial encounter: Secondary | ICD-10-CM

## 2023-06-03 DIAGNOSIS — L820 Inflamed seborrheic keratosis: Secondary | ICD-10-CM

## 2023-06-03 DIAGNOSIS — L821 Other seborrheic keratosis: Secondary | ICD-10-CM

## 2023-06-03 DIAGNOSIS — L570 Actinic keratosis: Secondary | ICD-10-CM | POA: Diagnosis not present

## 2023-06-03 DIAGNOSIS — L720 Epidermal cyst: Secondary | ICD-10-CM

## 2023-06-03 DIAGNOSIS — L309 Dermatitis, unspecified: Secondary | ICD-10-CM

## 2023-06-03 DIAGNOSIS — L304 Erythema intertrigo: Secondary | ICD-10-CM

## 2023-06-03 MED ORDER — KETOCONAZOLE 2 % EX CREA: TOPICAL_CREAM | CUTANEOUS | 3 refills | Status: DC

## 2023-06-03 MED ORDER — HYDROCORTISONE 2.5 % EX CREA: TOPICAL_CREAM | CUTANEOUS | 2 refills | Status: AC

## 2023-06-03 NOTE — Progress Notes (Signed)
Follow-Up Visit   Subjective  Jody Avery is a 77 y.o. female who presents for the following: Pink, itchy spot on the left lower neck that she noticed a month or more ago. She has other itchy spots to point out. She also has a rash under the breasts and has used ketoconazole 2% cream, but tube is expired.    The following portions of the chart were reviewed this encounter and updated as appropriate: medications, allergies, medical history  Review of Systems:  No other skin or systemic complaints except as noted in HPI or Assessment and Plan.  Objective  Well appearing patient in no apparent distress; mood and affect are within normal limits.  A focused examination was performed of the following areas: Face, neck, inframammary  Relevant physical exam findings are noted in the Assessment and Plan.  Left lower neck x 1, Right lower sternum x 1, Right upper sternum x 1, Left infraocular x 1 (4) Erythematous stuck-on, waxy papule  R lateral index finger Pink scaly macule.    Assessment & Plan   Inflamed seborrheic keratosis (4) Left lower neck x 1, Right lower sternum x 1, Right upper sternum x 1, Left infraocular x 1  Symptomatic, irritating, patient would like treated.  Destruction of lesion - Left lower neck x 1, Right lower sternum x 1, Right upper sternum x 1, Left infraocular x 1 (4)  Destruction method: cryotherapy   Informed consent: discussed and consent obtained   Lesion destroyed using liquid nitrogen: Yes   Region frozen until ice ball extended beyond lesion: Yes   Outcome: patient tolerated procedure well with no complications   Post-procedure details: wound care instructions given   Additional details:  Prior to procedure, discussed risks of blister formation, small wound, skin dyspigmentation, or rare scar following cryotherapy. Recommend Vaseline ointment to treated areas while healing.   AK (actinic keratosis) R lateral index finger  Actinic  keratoses are precancerous spots that appear secondary to cumulative UV radiation exposure/sun exposure over time. They are chronic with expected duration over 1 year. A portion of actinic keratoses will progress to squamous cell carcinoma of the skin. It is not possible to reliably predict which spots will progress to skin cancer and so treatment is recommended to prevent development of skin cancer.  Recommend daily broad spectrum sunscreen SPF 30+ to sun-exposed areas, reapply every 2 hours as needed.  Recommend staying in the shade or wearing long sleeves, sun glasses (UVA+UVB protection) and wide brim hats (4-inch brim around the entire circumference of the hat). Call for new or changing lesions.  Destruction of lesion - R lateral index finger  Destruction method: cryotherapy   Informed consent: discussed and consent obtained   Lesion destroyed using liquid nitrogen: Yes   Region frozen until ice ball extended beyond lesion: Yes   Outcome: patient tolerated procedure well with no complications   Post-procedure details: wound care instructions given   Additional details:  Prior to procedure, discussed risks of blister formation, small wound, skin dyspigmentation, or rare scar following cryotherapy. Recommend Vaseline ointment to treated areas while healing.   SEBORRHEIC KERATOSIS - Stuck-on, waxy, tan-brown papules and/or plaques  - Benign-appearing - Discussed benign etiology and prognosis. - Observe - Call for any changes  DERMATITIS vs INFLAMED SEBORRHEIC KERATOSES  Exam: Scattered pink papules on chest. Pt states itches  Treatment Plan: Start 2.5% hydrocortisone once to twice daily until improved. Patient has at home.    INTERTRIGO Exam Mild erythema of  the inframammary folds.  Chronic and persistent condition with duration or expected duration over one year. Condition is bothersome/symptomatic for patient. Currently flared.   Intertrigo is a chronic recurrent rash that  occurs in skin fold areas that may be associated with friction; heat; moisture; yeast; fungus; and bacteria.  It is exacerbated by increased movement / activity; sweating; and higher atmospheric temperature.  Treatment Plan Mix hydrocortisone 2.5% with ketaconazole 2% twice a day. If improved, decrease to hydrocortisone and ketaconazole mixed once a day. If still clear, decrease to ketaconazole only.    Milia - tiny firm white papules - type of cyst - benign - sometimes these will clear with nightly OTC adapalene/Differin 0.1% gel or retinol. - may be extracted if symptomatic - observe   Return as scheduled, for TBSE.  ICherlyn Labella, CMA, am acting as scribe for Willeen Niece, MD .   Documentation: I have reviewed the above documentation for accuracy and completeness, and I agree with the above.  Willeen Niece, MD

## 2023-06-03 NOTE — Patient Instructions (Addendum)
Start 2.5% hydrocortisone cream to chest once to twice daily until improved.  Intertrigo (rash under breasts)  Mix hydrocortisone 2.5% with ketaconazole 2% twice a day. If improved, decrease to hydrocortisone and ketaconazole mixed once a day. If still clear, decrease to ketaconazole only.    Cryotherapy Aftercare  Wash gently with soap and water everyday.   Apply Vaseline and Band-Aid daily until healed.    Seborrheic Keratosis  What causes seborrheic keratoses? Seborrheic keratoses are harmless, common skin growths that first appear during adult life.  As time goes by, more growths appear.  Some people may develop a large number of them.  Seborrheic keratoses appear on both covered and uncovered body parts.  They are not caused by sunlight.  The tendency to develop seborrheic keratoses can be inherited.  They vary in color from skin-colored to gray, brown, or even black.  They can be either smooth or have a rough, warty surface.   Seborrheic keratoses are superficial and look as if they were stuck on the skin.  Under the microscope this type of keratosis looks like layers upon layers of skin.  That is why at times the top layer may seem to fall off, but the rest of the growth remains and re-grows.    Treatment Seborrheic keratoses do not need to be treated, but can easily be removed in the office.  Seborrheic keratoses often cause symptoms when they rub on clothing or jewelry.  Lesions can be in the way of shaving.  If they become inflamed, they can cause itching, soreness, or burning.  Removal of a seborrheic keratosis can be accomplished by freezing, burning, or surgery. If any spot bleeds, scabs, or grows rapidly, please return to have it checked, as these can be an indication of a skin cancer.   Due to recent changes in healthcare laws, you may see results of your pathology and/or laboratory studies on MyChart before the doctors have had a chance to review them. We understand that in  some cases there may be results that are confusing or concerning to you. Please understand that not all results are received at the same time and often the doctors may need to interpret multiple results in order to provide you with the best plan of care or course of treatment. Therefore, we ask that you please give [place] 2 business days to thoroughly review all your results before contacting the office for clarification. Should we see a critical lab result, you will be contacted sooner.   If You Need Anything After Your Visit  If you have any questions or concerns for your doctor, please call our main line at [phone] and press option 4 to reach your doctor's medical assistant. If no one answers, please leave a voicemail as directed and we will return your call as soon as possible. Messages left after 4 pm will be answered the following business day.   You may also send [place] a message via MyChart. We typically respond to MyChart messages within 1-2 business days.  For prescription refills, please ask your pharmacy to contact our office. Our fax number is [phone].  If you have an urgent issue when the clinic is closed that cannot wait until the next business day, you can page your doctor at the number below.    Please note that while we do our best to be available for urgent issues outside of office hours, we are not available 24/7.   If you have an urgent issue and are  unable to reach [place], you may choose to seek medical care at your doctor's office, retail clinic, urgent care center, or emergency room.  If you have a medical emergency, please immediately call 911 or go to the emergency department.  Pager Numbers  - Dr. Gwen Pounds: [phone]  - Dr. Roseanne Reno: [phone]  - Dr. Katrinka Blazing: [phone]   In the event of inclement weather, please call our main line at [phone] for an update on the status of any delays or closures.  Dermatology Medication Tips: Please keep the boxes that  topical medications come in in order to help keep track of the instructions about where and how to use these. Pharmacies typically print the medication instructions only on the boxes and not directly on the medication tubes.   If your medication is too expensive, please contact our office at [phone] option 4 or send [place] a message through MyChart.   We are unable to tell what your co-pay for medications will be in advance as this is different depending on your insurance coverage. However, we may be able to find a substitute medication at lower cost or fill out paperwork to get insurance to cover a needed medication.   If a prior authorization is required to get your medication covered by your insurance company, please allow [place] 1-2 business days to complete this process.  Drug prices often vary depending on where the prescription is filled and some pharmacies may offer cheaper prices.  The website www.goodrx.com contains coupons for medications through different pharmacies. The prices here do not account for what the cost may be with help from insurance (it may be cheaper with your insurance), but the website can give you the price if you did not use any insurance.  - You can print the associated coupon and take it with your prescription to the pharmacy.  - You may also stop by our office during regular business hours and pick up a GoodRx coupon card.  - If you need your prescription sent electronically to a different pharmacy, notify our office through [facility] or by phone at [phone] option 4.     Si Usted Necesita Algo Despus de Su Visita  Tambin puede enviarnos un mensaje a travs de Clinical cytogeneticist. Por lo general respondemos a los mensajes de MyChart en el transcurso de 1 a 2 das hbiles.  Para renovar recetas, por favor pida a su farmacia que se ponga en contacto con nuestra oficina. Annie Sable de fax es [place] [phone].  Si tiene un asunto urgente cuando la clnica  est cerrada y que no puede esperar hasta el siguiente da hbil, puede llamar/localizar a su doctor(a) al nmero que aparece a continuacin.   Por favor, tenga en cuenta que aunque hacemos todo lo posible para estar disponibles para asuntos urgentes fuera del horario de [place], no estamos disponibles las 24 horas del da, los 7 [address]  [address] de la [place].   Si tiene un problema urgente y no puede comunicarse con nosotros, puede optar por buscar atencin mdica  en el consultorio de su doctor(a), en una clnica privada, en un centro de atencin urgente o en una sala de emergencias.  Si tiene Engineer, drilling, por favor llame inmediatamente al 911 o vaya a la sala de emergencias.  Nmeros de bper  - Dr. Gwen Pounds: [phone]  - Dra. Roseanne Reno: [phone]  - Dr. Katrinka Blazing: [phone]   En caso de inclemencias del tiempo, por favor llame a Lacy Duverney principal al [phone] para Neomia Dear actualizacin sobre  el estado de cualquier retraso o cierre.  Consejos para la medicacin en dermatologa: Por favor, guarde las cajas en las que vienen los medicamentos de uso tpico para ayudarle a seguir las instrucciones sobre dnde y cmo usarlos. Las farmacias generalmente imprimen las instrucciones del medicamento slo en las cajas y no directamente en los tubos del [place].   Si su medicamento es muy caro, por favor, pngase en contacto con Rolm Gala llamando al [phone] y presione la opcin 4 o envenos un mensaje a travs de Clinical cytogeneticist.   No podemos decirle cul ser su copago por los medicamentos por adelantado ya que esto es diferente dependiendo de la cobertura de su seguro. Sin embargo, es posible que podamos encontrar un medicamento sustituto a Audiological scientist un formulario para que el seguro cubra el medicamento que se considera necesario.   Si se requiere una autorizacin previa para que su compaa de seguros [place] su medicamento, por favor permtanos de 1 a 2 das hbiles para  completar [address]  Los precios de los medicamentos varan con frecuencia dependiendo del Environmental consultant de dnde se surte la receta y alguna farmacias pueden ofrecer precios ms baratos.  El sitio web www.goodrx.com tiene cupones para medicamentos de Health and safety inspector. Los precios aqu no tienen en cuenta lo que podra costar con la ayuda del seguro (puede ser ms barato con su seguro), pero el sitio web puede darle el precio si no utiliz Tourist information centre manager.  - Puede imprimir el cupn correspondiente y llevarlo con su receta a la farmacia.  - Tambin puede pasar por nuestra oficina durante el horario de atencin regular y Education officer, museum una tarjeta de cupones de GoodRx.  - Si necesita que su receta se enve electrnicamente a una farmacia diferente, informe a nuestra oficina a travs de MyChart de [place] o por telfono llamando al [phone] y presione la opcin 4.

## 2023-07-13 ENCOUNTER — Other Ambulatory Visit: Payer: Self-pay | Admitting: Internal Medicine

## 2023-07-13 DIAGNOSIS — Z0000 Encounter for general adult medical examination without abnormal findings: Secondary | ICD-10-CM

## 2023-07-13 DIAGNOSIS — R911 Solitary pulmonary nodule: Secondary | ICD-10-CM

## 2023-07-21 ENCOUNTER — Ambulatory Visit
Admission: RE | Admit: 2023-07-21 | Discharge: 2023-07-21 | Disposition: A | Discharge status: Home / Self Care | Payer: Medicare HMO | Source: Ambulatory Visit | Attending: Internal Medicine | Admitting: Internal Medicine

## 2023-07-21 DIAGNOSIS — Z0000 Encounter for general adult medical examination without abnormal findings: Secondary | ICD-10-CM | POA: Diagnosis present

## 2023-07-21 DIAGNOSIS — R911 Solitary pulmonary nodule: Secondary | ICD-10-CM | POA: Insufficient documentation

## 2023-08-06 ENCOUNTER — Other Ambulatory Visit: Payer: Self-pay | Admitting: Internal Medicine

## 2023-08-06 ENCOUNTER — Ambulatory Visit
Admission: RE | Admit: 2023-08-06 | Discharge: 2023-08-06 | Disposition: A | Discharge status: Home / Self Care | Payer: Medicare HMO | Source: Ambulatory Visit | Attending: Internal Medicine | Admitting: Internal Medicine

## 2023-08-06 DIAGNOSIS — S32030A Wedge compression fracture of third lumbar vertebra, initial encounter for closed fracture: Secondary | ICD-10-CM | POA: Diagnosis present

## 2023-08-06 DIAGNOSIS — M79606 Pain in leg, unspecified: Secondary | ICD-10-CM | POA: Insufficient documentation

## 2023-08-06 DIAGNOSIS — M545 Low back pain, unspecified: Secondary | ICD-10-CM | POA: Diagnosis present

## 2023-08-07 ENCOUNTER — Other Ambulatory Visit: Payer: Self-pay | Admitting: Internal Medicine

## 2023-08-07 DIAGNOSIS — S32030A Wedge compression fracture of third lumbar vertebra, initial encounter for closed fracture: Secondary | ICD-10-CM

## 2023-08-11 ENCOUNTER — Ambulatory Visit
Admission: RE | Admit: 2023-08-11 | Discharge: 2023-08-11 | Disposition: A | Discharge status: Home / Self Care | Payer: Medicare HMO | Source: Ambulatory Visit | Attending: Internal Medicine | Admitting: Internal Medicine

## 2023-08-11 DIAGNOSIS — S32030A Wedge compression fracture of third lumbar vertebra, initial encounter for closed fracture: Secondary | ICD-10-CM

## 2023-08-11 HISTORY — PX: IR RADIOLOGIST EVAL & MGMT: IMG5224

## 2023-08-11 NOTE — Consult Note (Signed)
Chief Complaint: Back pain  Referring Physician(s): Miller,Mark F  History of Present Illness: Jody Avery is a 77 y.o. female presenting to [facility] as a scheduled consultation, kindly referred by Dr. Bethann Punches, for evaluation of candidacy for vertebral augmentation.    Ms Shofner is here today with her husband for the interview.   She tells me that the day before Thanksgiving she went to her back porch to open the windows and pick a flower, and then went back to her kitchen table to finish her coffee.  Upon sitting down, she realized that she was having severe back pain.  She called the EMS, which evaluated her, and then suggested she be evaluated at urgent ortho clinic.  She then drove to ortho clinic and had xray series done.  She was prescribed pain medication. Office visit followed.  MRI 08/06/23 = L3 compression fracture   She continues to experience 9/10 - 10/10 intensity pain.  She has difficult time describing the quality, but says it is "breathtaking pain".  She is unable to sleep flat, and has taken to sleeping on her sofa/recliner with her feet elevated.  They remain gravity dependent, and she has developed some right > left lower extremity swelling.  She also was on a short course of prednisone that likely contributed to her swelling.  While previously she was able to do all of her daily activity comfortably, including cooking, cleaning, and preparing for the holidays, she is now unable given the pain.  She is fairly immobile.   She scores 21/24 on Roland-Morris disability score.    She continues to take pain medication every 4 hours, with only minimal relief.  She describes side effects of altered sensorium and instability.   Denies any current symptoms of UTI or pneumonia. Denies fever.   DEXA 12/07/19 = The BMD measured at AP Spine L1-L4 is 0.859 g/cm2 with a T-score of -2.7. [employer] writer [facility]) criteria for post-menopausal, Caucasian  Women: Normal       T-score at or above -1 SD Osteopenia   T-score between -1 and -2.5 SD Osteoporosis T-score at or below -2.5 SD RECOMMENDATION: 1. All patients should optimize calcium and vitamin D intake. 2. Consider FDA approved medical therapies in postmenopausal women and men aged 29 years and older, based on the following: a. A hip or vertebral (clinical or morphometric) fracture b. T-score < or = -2.5 at the femoral neck or spine after appropriate evaluation to exclude secondary causes c. Low bone mass (T-score between -1.0 and -2.5 at the femoral neck or spine) and a 10 year probability of a hip fracture > or = 3% or a 10 year probability of a major osteoporosis-related fracture > or = 20% based on the [place]-adapted WHO algorithm d. Clinician judgment and/or patient preferences may indicate treatment for people with 10-year fracture probabilities above or below these levels   FOLLOW-UP: Patients with diagnosis of osteoporosis or at high risk for fracture should have regular bone mineral density tests. For patients eligible for Medicare, routine testing is allowed once every 2 years.   The testing frequency can be increased to one year for patients who have rapidly progressing disease, those who are receiving or discontinuing medical therapy to restore bone mass, or have additional risk factors.   Past Medical History:  Diagnosis Date   Allergy    allergic rhinitis   Asthma    Basal cell carcinoma 02/07/2020   R lower pretibia   DDD (  degenerative disc disease)    low back pain deg disc dz with infections   Hearing loss    mild   Hyperlipidemia    Osteopenia    Osteoporosis    RAD (reactive airway disease), moderate persistent, uncomplicated    Squamous cell carcinoma of skin 08/10/2013   left dorsal hand/in situ   Tinnitus     Past Surgical History:  Procedure Laterality Date   ABDOMINAL HYSTERECTOMY  1989   total, fibroids   BACK SURGERY      COLONOSCOPY N/A 01/23/2022   Procedure: COLONOSCOPY;  Surgeon: [name], DO;  Location: [facility] ENDOSCOPY;  Service: Gastroenterology;  Laterality: N/A;   ESOPHAGOGASTRODUODENOSCOPY N/A 01/23/2022   Procedure: ESOPHAGOGASTRODUODENOSCOPY (EGD);  Surgeon: [name], DO;  Location: [facility] ENDOSCOPY;  Service: Gastroenterology;  Laterality: N/A;   EYE SURGERY     KNEE CARTILAGE SURGERY     torn meniscus  2007   left    TRIGGER FINGER RELEASE Right    right, middle and inder fingers    Allergies: Alendronate sodium, Ceftin [cefuroxime], Ciprofloxacin, Clarithromycin, Codeine, Diclofenac sodium, Fish oil, Flagyl [metronidazole], Meloxicam, Penicillins, Prolia [denosumab], Rabeprazole sodium, Raloxifene, Sulfonamide derivatives, Azithromycin, and Sulfa antibiotics  Medications: Prior to Admission medications   Medication Sig Start Date End Date Taking? Authorizing Provider  azelastine (ASTELIN) 0.1 % nasal spray Place into both nostrils 2 (two) times daily. Use in each nostril as directed    [provider]  bisoprolol (ZEBETA) 5 MG tablet Take 2.5 mg by mouth at bedtime.    [provider]  cholecalciferol (VITAMIN D) 1000 units tablet Take 4,000 Units by mouth daily.    [provider]  hydrocortisone 2.5 % cream Apply to affected area rash once to twice daily as directed. 06/03/23   Willeen Niece, MD  ketoconazole (NIZORAL) 2 % cream Apply to affected areas rash once to twice daily as directed. 06/03/23   Willeen Niece, MD  LUTEIN PO Take 1 tablet by mouth every other day.    [provider]  polyethylene glycol powder (GLYCOLAX/MIRALAX) 17 GM/SCOOP powder Take 1 Container by mouth once.    [provider]  vitamin B-12 (CYANOCOBALAMIN) 1000 MCG tablet Take 1,000 mcg by mouth once a week.    [provider]     Family History  Problem Relation Age of Onset   Osteoporosis Mother    Cancer Mother        ? liver cancer ?  primary   Heart disease Father        MI   Osteoporosis Sister    Diabetes Sister    Heart disease Sister        CHF   Heart disease Brother        CAD   Heart disease Brother        CAD   Osteoporosis Sister    Diabetes Sister    Breast cancer Neg Hx     Social History   Socioeconomic History   Marital status: Married    Spouse name: Not on file   Number of children: Not on file   Years of education: Not on file   Highest education level: Not on file  Occupational History   Not on file  Tobacco Use   Smoking status: Never   Smokeless tobacco: Never  Vaping Use   Vaping status: Never Used  Substance and Sexual Activity   Alcohol use: No    Alcohol/week: 0.0 standard drinks of alcohol  Drug use: No   Sexual activity: Never  Other Topics Concern   Not on file  Social History Narrative   Not on file   Social Determinants of Health   Financial Resource Strain: Low Risk  (08/10/2023)   Received from [facility]   Overall Financial Resource Strain (CARDIA)    Difficulty of Paying Living Expenses: Not hard at all  Food Insecurity: No Food Insecurity (08/10/2023)   Received from [facility]   Hunger Vital Sign    Worried About Running Out of Food in the Last Year: Never true    Ran Out of Food in the Last Year: Never true  Transportation Needs: No Transportation Needs (08/10/2023)   Received from [facility] - Transportation    In the past 12 months, has lack of transportation kept you from medical appointments or from getting medications?: No    Lack of Transportation (Non-Medical): No  Physical Activity: Inactive (08/18/2019)   Exercise Vital Sign    Days of Exercise per Week: 0 days    Minutes of Exercise per Session: 0 min  Stress: No Stress Concern Present (08/18/2019)   [employer] of [employer] - Occupational Stress Questionnaire    Feeling of Stress : Not at all  Social  Connections: Not on file      Review of Systems: A 12 point ROS discussed and pertinent positives are indicated in the HPI above.  All other systems are negative.  Review of Systems  Vital Signs: There were no vitals taken for this visit.  Advance Care Plan: The advanced care plan/surrogate decision maker was discussed at the time of visit and documented in the medical record.    Physical Exam General: 77 yo female appearing stated age.  Well-developed, well-nourished.  Uncomfortable in wheelchair.  HEENT: Atraumatic, normocephalic.  Glasses. Conjugate gaze, extra-ocular motor intact. No scleral icterus or scleral injection. No lesions on external ears, nose, lips, or gums.  Oral mucosa moist, pink.  Neck: Symmetric with no goiter enlargement.  Chest/Lungs:  Symmetric chest with inspiration/expiration.  No labored breathing.  Clear to auscultation with no wheezes, rhonchi, or rales.  Heart:  RRR, with no third heart sounds appreciated. No JVD appreciated.  Abdomen:  Soft, NT/ND, with + bowel sounds.   Genito-urinary: Deferred Neurologic: Alert & Oriented to person, place, and time.   Normal affect and insight.  Appropriate questions.   Extremities: Right greater than left soft pitting edema. On the left just at foot ankle. On the right, extends above the ankle, but not calf.  MSK: TTP with reproducible pain at the mid lumbar spine.  Pulse Exam:  No bruit appreciated.       Mallampati Score:     Imaging: CT CHEST WO CONTRAST  Result Date: 08/08/2023 CLINICAL DATA:  Lung nodule. EXAM: CT CHEST WITHOUT CONTRAST TECHNIQUE: Multidetector CT imaging of the chest was performed following the standard protocol without IV contrast. RADIATION DOSE REDUCTION: This exam was performed according to the departmental dose-optimization program which includes automated exposure control, adjustment of the mA and/or kV according to patient size and/or use of iterative reconstruction technique.  COMPARISON:  Abdomen pelvis CT 09/11/2021. FINDINGS: Cardiovascular: On this non IV contrast exam the thoracic aorta has a normal caliber with scattered vascular calcifications. Tortuous course of the descending thoracic aorta. Coronary artery calcifications are seen. The heart is nonenlarged. No pericardial effusion. Mediastinum/Nodes: Preserved thyroid gland. Slightly patulous thoracic esophagus. On this  non IV contrast exam there is no specific abnormal lymph node enlargement identified in the axillary regions, hilum or mediastinum. Lungs/Pleura: There is some linear opacities identified likely scar or atelectasis. No consolidation, pneumothorax or effusion. There is a 8 mm nodule in the left costophrenic angle today on series 3, image 107. On the prior when measured in the same fashion is today this would have measured 7 mm. Is also a small nodule similar location on the right today measuring 3 mm on series 3, image 99 which previously with a semi-solid area measuring 3 mm. Upper Abdomen: The adrenal glands are grossly preserved. Stones in the nondilated gallbladder. Musculoskeletal: Osteopenia. Mild degenerative changes along the spine IMPRESSION: Essentially stable nodule left lower lobe when adjusting for technique measuring 7 mm. Slightly more confluence smaller 3 mm right lower lobe lung nodule. Recommend follow up 6 months. Gallstones Aortic Atherosclerosis (ICD10-I70.0). Electronically Signed   By: Karen Kays M.D.   On: 08/08/2023 16:33   MR LUMBAR SPINE WO CONTRAST  Result Date: 08/06/2023 CLINICAL DATA:  Closed compression fracture of the L3 lumbar vertebra, initial encounter. EXAM: MRI LUMBAR SPINE WITHOUT CONTRAST TECHNIQUE: Multiplanar, multisequence MR imaging of the lumbar spine was performed. No intravenous contrast was administered. COMPARISON:  MRI of the lumbar spine Jan 05, 2020. FINDINGS: Segmentation:  Standard. Alignment:  Physiologic. Vertebrae: Compression fracture of the L3  vertebral body resulting in approximately 40% vertebral body height loss with no significant retropulsion. Associated marrow edema suggests acute/subacute fracture. No evidence of discitis or bone lesion. Conus medullaris and cauda equina: Conus extends to the L1 level. Conus and cauda equina appear normal. Paraspinal and other soft tissues: Negative. Disc levels: T12-L1: No spinal canal or neural foraminal stenosis. L1-2: No spinal canal or neural foraminal stenosis. L2-3: Disc bulge and mild facet degenerative changes without significant spinal canal or neural foraminal stenosis. L3-4: Disc bulge, moderate facet degenerative changes and ligamentum flavum redundancy resulting in mild spinal canal stenosis with narrowing of the bilateral subarticular zones and mild bilateral neural foraminal narrowing, not significantly changed when compared to prior MRI. L4-5: Loss of disc height, disc bulge, mild facet hypertrophy and ligamentum flavum redundancy resulting in mild spinal canal stenosis with moderate narrowing of the bilateral subarticular zones and mild bilateral neural foraminal narrowing, unchanged. L5-S1: Shallow disc bulge with superimposed central disc protrusion moderate facet degenerative changes resulting in mild spinal canal stenosis with moderate narrowing of the bilateral subarticular zones, unchanged. No significant neural foraminal narrowing. IMPRESSION: 1. Acute/subacute compression fracture of the L3 vertebral body resulting in approximately 40% vertebral body height loss with no significant retropulsion. 2. Degenerative changes of the lumbar spine with mild spinal canal stenosis at L3-4, L4-5 and L5-S1 with moderate narrowing of the bilateral subarticular zones at these levels. 3. Mild bilateral neural foraminal narrowing at L3-4 and L4-5. Electronically Signed   By: Baldemar Lenis M.D.   On: 08/06/2023 16:02    Labs:  CBC: No results for input(s): "WBC", "HGB", "HCT", "PLT" in  the last 8760 hours.  COAGS: No results for input(s): "INR", "APTT" in the last 8760 hours.  BMP: No results for input(s): "NA", "K", "CL", "CO2", "GLUCOSE", "BUN", "CALCIUM", "CREATININE", "GFRNONAA", "GFRAA" in the last 8760 hours.  Invalid input(s): "CMP"  LIVER FUNCTION TESTS: No results for input(s): "BILITOT", "AST", "ALT", "ALKPHOS", "PROT", "ALBUMIN" in the last 8760 hours.  TUMOR MARKERS: No results for input(s): "AFPTM", "CEA", "CA199", "CHROMGRNA" in the last 8760 hours.  Assessment and Plan:  Ms Puma is a 77 yo female with history of osteoporosis-related L3 compression fracture, symptomatic, with life-style limiting symptoms of severe pain.    I had a discussion with her and her husband regarding the anatomy, pathology, pathophysiology, and treatment options for compression fracture. This might include analgesics, observation/conservative care, intervention with image guided vertebral augmentation.  Formal surgery is typically not offered for this type of surgery.   Regarding the risks of vertebral augmentation, specific risks include: bleeding, infection, local injury, cement embolization, cement migration, nerve injury, need for hospitalization, ongoing pain, need for further surgery/procedure, complications from anesthesia/sedation, cardiopulmonary collapse, death.   The goals of therapy are primarily pain relief, with secondary goals of decreasing the need for high doses of pain medication, as well as improving function for comfort of ADL's.  While this therapy is good for pain relief, I did let them know that a satisfactory treatment might not yield complete pain free.   After our discussion, she would like to proceed ASAP.    Though a previous DEXA shows she has osteoporosis (T score of -2.7), she has a repeat DEXA scheduled.   Plan: - plan for L3 vertebral augmentation/kyphoplasty ASAP - continue current care  Thank you for this interesting consult.  I greatly  enjoyed meeting  and look forward to participating in their care.  A copy of this report was sent to the requesting provider on this date.  Electronically Signed: Gilmer Mor 08/11/2023, 2:14 PM   I spent a total of  60 Minutes   in face to face in clinical consultation, greater than 50% of which was counseling/coordinating care for L3 osteoporotic compression fracture, possible vertebral augmentation/kyphoplasty

## 2023-08-13 ENCOUNTER — Other Ambulatory Visit: Payer: Self-pay | Admitting: Internal Medicine

## 2023-08-13 DIAGNOSIS — Z1231 Encounter for screening mammogram for malignant neoplasm of breast: Secondary | ICD-10-CM

## 2023-09-01 ENCOUNTER — Other Ambulatory Visit: Payer: Self-pay | Admitting: Internal Medicine

## 2023-09-01 DIAGNOSIS — S32010G Wedge compression fracture of first lumbar vertebra, subsequent encounter for fracture with delayed healing: Secondary | ICD-10-CM

## 2023-09-04 ENCOUNTER — Other Ambulatory Visit: Payer: Self-pay | Admitting: Diagnostic Radiology

## 2023-09-04 ENCOUNTER — Ambulatory Visit
Admission: RE | Admit: 2023-09-04 | Discharge: 2023-09-04 | Disposition: A | Discharge status: Home / Self Care | Payer: Medicare HMO | Source: Ambulatory Visit | Attending: Internal Medicine | Admitting: Internal Medicine

## 2023-09-04 DIAGNOSIS — S32010G Wedge compression fracture of first lumbar vertebra, subsequent encounter for fracture with delayed healing: Secondary | ICD-10-CM

## 2023-09-04 DIAGNOSIS — S32010A Wedge compression fracture of first lumbar vertebra, initial encounter for closed fracture: Secondary | ICD-10-CM

## 2023-09-04 HISTORY — PX: IR RADIOLOGIST EVAL & MGMT: IMG5224

## 2023-09-04 NOTE — Consult Note (Addendum)
 Chief Complaint: Patient was seen in consultation today for No chief complaint on file.  at the request of Cleotilde Oneil FALCON  Referring Physician(s): Miller,Mark F  History of Present Illness: Jody  [name] seen in consultation for possible L1 and L3 vertebral augmentation. She was initially seen by my colleague, Dr. Alona, on 08/11/2023 in consultation for possible L3 verterbal augmentation. She has since had a DEXA scan and lumbar spine radiographs at [facility]. The lumbar spine radiographs reportedly showed a new L1 vertebral compression fracture. The DEXA report was reviewed in Care Everywhere and demonstrates osteoporosis.  Since the initial consultation, her pain has increased considerably. She now reports 23/24 items on the [employer] Disability Questionnaire. She has tried back bracing, but this made the pain worse. She continues to take oral pain medications including oxycodone and Tylenol , but these have not provided her sufficient relief and she is unable to tolerate higher dosing due to GI and urinary side effects of the medication. She reports a maximum of 2 hrs of pain relief after taking medicine and has difficulty sleeping and eating due to severe pain and associated nausea. Both her referring physician and I think it is unlikely that she will be able to tolerate physical therapy due to severe pain and resultant immobility, but she will have a home health PT visit her tomorrow and attempt a session of PT.  Her pain continues to be located in the central lower back, though she reports it has spread slightly higher than at her previous visit. It occasionally radiates to the paraspinal region, but she denies radiation to the lower abdomen, groin, or either lower extremity.   Past Medical History:  Diagnosis Date   Allergy    allergic rhinitis   Asthma    Basal cell carcinoma 02/07/2020   R lower pretibia   DDD (degenerative disc disease)     low back pain deg disc dz with infections   Hearing loss    mild   Hyperlipidemia    Osteopenia    Osteoporosis    RAD (reactive airway disease), moderate persistent, uncomplicated    Squamous cell carcinoma of skin 08/10/2013   left dorsal hand/in situ   Tinnitus     Past Surgical History:  Procedure Laterality Date   ABDOMINAL HYSTERECTOMY  1989   total, fibroids   BACK SURGERY     COLONOSCOPY N/A 01/23/2022   Procedure: COLONOSCOPY;  Surgeon: Onita Elspeth Sharper, DO;  Location: [facility] ENDOSCOPY;  Service: Gastroenterology;  Laterality: N/A;   ESOPHAGOGASTRODUODENOSCOPY N/A 01/23/2022   Procedure: ESOPHAGOGASTRODUODENOSCOPY (EGD);  Surgeon: Onita Elspeth Sharper, DO;  Location: [facility] ENDOSCOPY;  Service: Gastroenterology;  Laterality: N/A;   EYE SURGERY     IR RADIOLOGIST EVAL & MGMT  08/11/2023   KNEE CARTILAGE SURGERY     torn meniscus  2007   left    TRIGGER FINGER RELEASE Right    right, middle and inder fingers    Allergies: Alendronate sodium, Ceftin [cefuroxime], Ciprofloxacin , Clarithromycin, Codeine, Diclofenac sodium, Fish oil, Flagyl  [metronidazole ], Meloxicam, Penicillins, Prolia [denosumab], Rabeprazole sodium, Raloxifene, Sulfonamide derivatives, Azithromycin , and Sulfa antibiotics  Medications: Prior to Admission medications   Medication Sig Start Date End Date Taking? Authorizing Provider  acetaminophen  (TYLENOL ) 500 MG tablet Take 500 mg by mouth every 8 (eight) hours as needed for moderate pain (pain score 4-6).   Yes [provider]  azelastine (ASTELIN) 0.1 % nasal spray Place into both nostrils 2 (two) times daily.  Use in each nostril as directed   Yes [provider]  bisoprolol (ZEBETA) 5 MG tablet Take 2.5 mg by mouth at bedtime.   Yes [provider]  cholecalciferol (VITAMIN D ) 1000 units tablet Take 4,000 Units by mouth daily.   Yes [provider]  hydrocortisone  2.5 % cream Apply to affected area rash once to  twice daily as directed. 06/03/23  Yes Jackquline Sawyer, MD  ketoconazole  (NIZORAL ) 2 % cream Apply to affected areas rash once to twice daily as directed. 06/03/23  Yes Jackquline Sawyer, MD  LUTEIN PO Take 1 tablet by mouth every other day.   Yes [provider]  oxyCODONE-acetaminophen  (PERCOCET/ROXICET) 5-325 MG tablet Take 1 tablet by mouth every 4 (four) hours as needed for severe pain (pain score 7-10).   Yes [provider]  polyethylene glycol powder (GLYCOLAX/MIRALAX) 17 GM/SCOOP powder Take 1 Container by mouth once.   Yes [provider]  vitamin B-12 (CYANOCOBALAMIN) 1000 MCG tablet Take 1,000 mcg by mouth once a week.   Yes [provider]     Family History  Problem Relation Age of Onset   Osteoporosis Mother    Cancer Mother        ? liver cancer ? primary   Heart disease Father        MI   Osteoporosis Sister    Diabetes Sister    Heart disease Sister        CHF   Heart disease Brother        CAD   Heart disease Brother        CAD   Osteoporosis Sister    Diabetes Sister    Breast cancer Neg Hx     Social History   Socioeconomic History   Marital status: Married    Spouse name: Not on file   Number of children: Not on file   Years of education: Not on file   Highest education level: Not on file  Occupational History   Not on file  Tobacco Use   Smoking status: Never   Smokeless tobacco: Never  Vaping Use   Vaping status: Never Used  Substance and Sexual Activity   Alcohol use: No    Alcohol/week: 0.0 standard drinks of alcohol   Drug use: No   Sexual activity: Never  Other Topics Concern   Not on file  Social History Narrative   Not on file   Social Drivers of Health   Financial Resource Strain: Low Risk  (08/10/2023)   Received from [facility]   Overall Financial Resource Strain (CARDIA)    Difficulty of Paying Living Expenses: Not hard at all  Food Insecurity: No Food Insecurity (08/10/2023)    Received from [facility]   Hunger Vital Sign    Worried About Running Out of Food in the Last Year: Never true    Ran Out of Food in the Last Year: Never true  Transportation Needs: No Transportation Needs (08/10/2023)   Received from [facility] - Transportation    In the past 12 months, has lack of transportation kept you from medical appointments or from getting medications?: No    Lack of Transportation (Non-Medical): No  Physical Activity: Inactive (08/18/2019)   Exercise Vital Sign    Days of Exercise per Week: 0 days    Minutes of Exercise per Session: 0 min  Stress: No Stress Concern Present (08/18/2019)   [employer] - Occupational Stress Questionnaire    Feeling of Stress : Not at all  Social Connections: Not on file    ECOG Status: 3 - Symptomatic, >50% confined to bed  Review of Systems: A 12 point ROS discussed and pertinent positives are indicated in the HPI above.  All other systems are negative.  Review of Systems  Vital Signs: BP (!) 185/127 (BP Location: Right Arm, Patient Position: Sitting, Cuff Size: Normal)   Pulse 92   Temp 98.3 F (36.8 C) (Oral)   SpO2 96%   Physical Exam  Mallampati Score: 3  General: Tearful, uncomfortable, but otherwise well appearing. Lungs: clear to auscultation. Heart: regular rate and rhythm. No murmur. Spine: focal tenderness to palpation over L1 and L3 spinous processes, reproducing severe pain.    Imaging: IR Radiologist Eval & Mgmt Result Date: 08/11/2023 EXAM: NEW PATIENT OFFICE VISIT CHIEF COMPLAINT: Electronic medical record HISTORY OF PRESENT ILLNESS: Electronic medical record REVIEW OF SYSTEMS: Electronic medical record PHYSICAL EXAMINATION: Electronic medical record ASSESSMENT AND PLAN: Electronic medical record Electronically Signed   By: Ami Bellman D.O.   On: 08/11/2023 14:51   MR LUMBAR SPINE WO CONTRAST Result Date:  08/06/2023 CLINICAL DATA:  Closed compression fracture of the L3 lumbar vertebra, initial encounter. EXAM: MRI LUMBAR SPINE WITHOUT CONTRAST TECHNIQUE: Multiplanar, multisequence MR imaging of the lumbar spine was performed. No intravenous contrast was administered. COMPARISON:  MRI of the lumbar spine Jan 05, 2020. FINDINGS: Segmentation:  Standard. Alignment:  Physiologic. Vertebrae: Compression fracture of the L3 vertebral body resulting in approximately 40% vertebral body height loss with no significant retropulsion. Associated marrow edema suggests acute/subacute fracture. No evidence of discitis or bone lesion. Conus medullaris and cauda equina: Conus extends to the L1 level. Conus and cauda equina appear normal. Paraspinal and other soft tissues: Negative. Disc levels: T12-L1: No spinal canal or neural foraminal stenosis. L1-2: No spinal canal or neural foraminal stenosis. L2-3: Disc bulge and mild facet degenerative changes without significant spinal canal or neural foraminal stenosis. L3-4: Disc bulge, moderate facet degenerative changes and ligamentum flavum redundancy resulting in mild spinal canal stenosis with narrowing of the bilateral subarticular zones and mild bilateral neural foraminal narrowing, not significantly changed when compared to prior MRI. L4-5: Loss of disc height, disc bulge, mild facet hypertrophy and ligamentum flavum redundancy resulting in mild spinal canal stenosis with moderate narrowing of the bilateral subarticular zones and mild bilateral neural foraminal narrowing, unchanged. L5-S1: Shallow disc bulge with superimposed central disc protrusion moderate facet degenerative changes resulting in mild spinal canal stenosis with moderate narrowing of the bilateral subarticular zones, unchanged. No significant neural foraminal narrowing. IMPRESSION: 1. Acute/subacute compression fracture of the L3 vertebral body resulting in approximately 40% vertebral body height loss with no  significant retropulsion. 2. Degenerative changes of the lumbar spine with mild spinal canal stenosis at L3-4, L4-5 and L5-S1 with moderate narrowing of the bilateral subarticular zones at these levels. 3. Mild bilateral neural foraminal narrowing at L3-4 and L4-5. Electronically Signed   By: Katyucia  de Macedo Rodrigues M.D.   On: 08/06/2023 16:02    Assessment and Plan:  Jody Avery has a symptomatic, osteoporosis-related L3 vertebral compression fracture. She likely also has a symptomatic L1 vertebral compression fracture, though I was unable to review her outside lumbar spine radiographs myself.   She will need repeat cross-sectional imaging for further characterization. We will order a CT lumbar spine without contrast to be performed ASAP as she is likely unable  to tolerate MRI given her current severity of pain.  Prior MRI demonstrates an acute-to-subacute appearing compression fracture of L3 with approximately 40% height loss. While the report mentions moderate narrowing of the subarticular zones at the L3-4 level, she has no symptoms of lumbar radiculopathy and only has mild spinal canal stenosis at this level without symptoms of neurogenic claudication. Therefore, I do not believe that she has symptomatic spinal stenosis at this time.  She has not received sufficient pain relief with medical pain management, in part due to her intolerance of side effects of the pain medication. TLSO back bracing exacerbated her pain. And I think it is unlikely that she will be able to participate in physical therapy due to the severity of her pain and her associated disability. However, she will have a home PT consultation tomorrow.  Given her recent DEXA results confirming osteoporosis, she is encouraged her to revisit and optimize medical management of osteoporosis with her primary care physician.  In summary, I think she remains a good candidate for L3 vertebral augmentation with balloon kyphoplasty and  would likely also benefit from L1 kyphoplasty depending on the results of her upcoming CT scan.   The risks and benefits of the procedure were previously discussed with Dr. Alona and reviewed again today with her and her husband. She would like to proceed ASAP with scheduling this procedure.  Thank you for this interesting consult.  I greatly enjoyed meeting Jody  Nandana Avery and look forward to participating in their care.  A copy of this report was sent to the requesting provider on this date.  Electronically Signed: Ryan PHEBE Chess 09/04/2023, 5:22 PM   I spent a total of 40 Minutes in face to face in clinical consultation, greater than 50% of which was counseling/coordinating care for possible L1 and L3 kyphoplasty.    ADDENDUM: I have reviewed the results of Jody Avery's CT lumbar spine without contrast dated 09/07/2023. The exam demonstrates an incomplete burst fracture of the L1 vertebral body with 80% height loss anteriorly. While the report describes 5-32mm posterior bowing of the posterosuperior margin of the vertebral body, it states that this only results in mild canal stenosis at the L1 level without definite neural compression. As she does not have any symptoms of lumbar radiculopathy or neurogenic claudication, I believe it appropriate to proceed with kyphoplasty at both the L1 and L3 vertebral levels.

## 2023-09-07 ENCOUNTER — Ambulatory Visit
Admission: RE | Admit: 2023-09-07 | Discharge: 2023-09-07 | Disposition: A | Discharge status: Home / Self Care | Payer: Medicare HMO | Source: Ambulatory Visit | Attending: Diagnostic Radiology | Admitting: Diagnostic Radiology

## 2023-09-07 DIAGNOSIS — S32010A Wedge compression fracture of first lumbar vertebra, initial encounter for closed fracture: Secondary | ICD-10-CM

## 2023-09-14 ENCOUNTER — Other Ambulatory Visit: Payer: Self-pay | Admitting: Interventional Radiology

## 2023-09-14 DIAGNOSIS — S32030A Wedge compression fracture of third lumbar vertebra, initial encounter for closed fracture: Secondary | ICD-10-CM

## 2023-09-14 DIAGNOSIS — S32010G Wedge compression fracture of first lumbar vertebra, subsequent encounter for fracture with delayed healing: Secondary | ICD-10-CM

## 2023-09-16 ENCOUNTER — Ambulatory Visit
Admission: RE | Admit: 2023-09-16 | Discharge: 2023-09-16 | Disposition: A | Discharge status: Home / Self Care | Payer: 59 | Source: Ambulatory Visit | Attending: Interventional Radiology | Admitting: Interventional Radiology

## 2023-09-16 ENCOUNTER — Ambulatory Visit
Admission: RE | Admit: 2023-09-16 | Discharge: 2023-09-16 | Disposition: A | Discharge status: Home / Self Care | Payer: Medicare HMO | Source: Ambulatory Visit | Attending: Interventional Radiology | Admitting: Interventional Radiology

## 2023-09-16 DIAGNOSIS — S32010G Wedge compression fracture of first lumbar vertebra, subsequent encounter for fracture with delayed healing: Secondary | ICD-10-CM

## 2023-09-16 DIAGNOSIS — S32030A Wedge compression fracture of third lumbar vertebra, initial encounter for closed fracture: Secondary | ICD-10-CM

## 2023-09-16 HISTORY — PX: IR KYPHO LUMBAR INC FX REDUCE BONE BX UNI/BIL CANNULATION INC/IMAGING: IMG5519

## 2023-09-16 HISTORY — PX: IR KYPHO EA ADDL LEVEL THORACIC OR LUMBAR: IMG5520

## 2023-09-16 MED ORDER — ONDANSETRON HCL 4 MG/2ML IJ SOLN: 4.0000 mg | Freq: Once | INTRAMUSCULAR | Status: DC

## 2023-09-16 MED ORDER — ONDANSETRON HCL 4 MG/2ML IJ SOLN
4.0000 mg | Freq: Once | INTRAMUSCULAR | Status: AC
  Administered 2023-09-16: 4 mg via INTRAVENOUS

## 2023-09-16 MED ORDER — FENTANYL CITRATE PF 50 MCG/ML IJ SOSY
25.0000 ug | PREFILLED_SYRINGE | INTRAMUSCULAR | Status: DC | PRN
  Administered 2023-09-16: 50 ug via INTRAVENOUS
  Administered 2023-09-16 (×2): 25 ug via INTRAVENOUS
  Administered 2023-09-16: 50 ug via INTRAVENOUS
  Administered 2023-09-16 (×3): 25 ug via INTRAVENOUS

## 2023-09-16 MED ORDER — VANCOMYCIN HCL IN DEXTROSE 1-5 GM/200ML-% IV SOLN
1000.0000 mg | INTRAVENOUS | Status: AC
  Administered 2023-09-16: 1000 mg via INTRAVENOUS

## 2023-09-16 MED ORDER — ACETAMINOPHEN 10 MG/ML IV SOLN
1000.0000 mg | Freq: Once | INTRAVENOUS | Status: AC
  Administered 2023-09-16: 1000 mg via INTRAVENOUS

## 2023-09-16 MED ORDER — MIDAZOLAM HCL 2 MG/2ML IJ SOLN
1.0000 mg | INTRAMUSCULAR | Status: DC | PRN
  Administered 2023-09-16: 0.5 mg via INTRAVENOUS
  Administered 2023-09-16 (×2): 1 mg via INTRAVENOUS
  Administered 2023-09-16: 0.5 mg via INTRAVENOUS
  Administered 2023-09-16 (×2): 1 mg via INTRAVENOUS

## 2023-09-16 MED ORDER — SODIUM CHLORIDE 0.9 % IV SOLN: INTRAVENOUS | Status: DC

## 2023-09-16 MED ORDER — VANCOMYCIN HCL 1.5 G IV SOLR: 1500.0000 mg | INTRAVENOUS | Status: DC

## 2023-09-16 NOTE — Progress Notes (Signed)
 Pt back in nursing recovery area. Pt still drowsy from procedure but will wake up when spoken to. Pt follows commands, talks in complete sentences and has no complaints at this time. Pt will remain in nurses station until discharged by Radiologist.

## 2023-09-16 NOTE — Discharge Instructions (Signed)
 Kyphoplasty Post Procedure Discharge Instructions ? ?May resume a regular diet and any medications that you routinely take (including pain medications). However, if you are taking Aspirin or an anticoagulant/blood thinner you will be told when you can resume taking these by the healthcare provider. ?No driving day of procedure. ?The day of your procedure take it easy. You may use an ice pack as needed to injection sites on back.  Ice to back 30 minutes on and 30 minutes off, as needed. ?May remove bandaids tomorrow after taking a shower. Replace daily with a clean bandaid until healed.  ?Do not lift anything heavier than a milk jug for 1-2 weeks or determined by your physician.  ?Follow up with your physician in 2 weeks. ? ? ? ?Please contact our office at [phone] for the following symptoms or if you have any questions: ? ?Fever greater than 100 degrees ?Increased swelling, pain, or redness at injection site. ?Increased back and/or leg pain ?New numbness or change in symptoms from before the procedure.  ? ? ?Thank you for visiting [facility].  ?

## 2023-09-22 ENCOUNTER — Other Ambulatory Visit: Payer: Self-pay | Admitting: Interventional Radiology

## 2023-09-22 ENCOUNTER — Inpatient Hospital Stay
Admission: RE | Admit: 2023-09-22 | Discharge: 2023-09-22 | Disposition: A | Discharge status: Home / Self Care | Payer: Self-pay | Source: Ambulatory Visit | Attending: Neurosurgery | Admitting: Neurosurgery

## 2023-09-22 ENCOUNTER — Other Ambulatory Visit: Payer: Self-pay

## 2023-09-22 DIAGNOSIS — G8929 Other chronic pain: Secondary | ICD-10-CM

## 2023-09-22 DIAGNOSIS — Z049 Encounter for examination and observation for unspecified reason: Secondary | ICD-10-CM

## 2023-09-23 ENCOUNTER — Other Ambulatory Visit: Payer: Self-pay | Admitting: Interventional Radiology

## 2023-09-23 DIAGNOSIS — G8929 Other chronic pain: Secondary | ICD-10-CM

## 2023-09-25 ENCOUNTER — Ambulatory Visit
Admission: RE | Admit: 2023-09-25 | Discharge: 2023-09-25 | Disposition: A | Discharge status: Home / Self Care | Payer: Medicare HMO | Source: Ambulatory Visit | Attending: Interventional Radiology | Admitting: Interventional Radiology

## 2023-09-25 ENCOUNTER — Other Ambulatory Visit: Payer: Self-pay | Admitting: Interventional Radiology

## 2023-09-25 DIAGNOSIS — M545 Low back pain, unspecified: Secondary | ICD-10-CM

## 2023-09-25 HISTORY — PX: IR RADIOLOGIST EVAL & MGMT: IMG5224

## 2023-09-25 NOTE — Progress Notes (Signed)
Chief Complaint: Patient was seen in consultation today for recurrent/persistent back pain at the request of Deland Slocumb K  Referring Physician(s): Satish Hammers K  History of Present Illness: Jody Avery is a 78 y.o. female With a history of progressive lumbar compression fractures.  She was first seen by my partner Dr. Loreta Ave on 08/11/2023 with an acute L3 compression fracture.  She developed progressive symptoms and was seen again by another of my partners, Dr. Ernestina Penna on 09/04/2023.  Imaging performed at that time demonstrates a new L1 compression fracture with severe height loss.  I became involved with her care on the day of her procedure and performed a successful L1 and L3 kyphoplasty on 09/16/2023.  The procedure went well and she was discharged home in good condition.  She called this week requesting to be seen for recurrent severe back pain.  She tells me that when she went home on the 15th she had significantly decreased pain and was feeling better.  However, over the following days she developed recurrent severe back pain as bad or worse than what she had prior to the kyphoplasty procedure.  Lumbar spine x-ray performed today in the office demonstrates new height loss at L1 concerning for a new compression fracture.  Additionally, on her exam she has significant tenderness to palpation at the L2 level which raises concern that she has an additional fracture at that site.  She is also tender to palpation along the 12th rib, particularly on the right.  In addition to her back pain she complains of fatigue, intermittent constipation and diarrhea.  She is currently taking one half of a 5/325 hydrocodone/acetaminophen medicine every 8 hours for pain supplemented by a 500 mg Tylenol a few times daily.  This helps her pain but does not resolve it completely.  She tried Lyrica but could not tolerate that medication at all.  Higher doses of hydrocodone tend to put her to  sleep.  Past Medical History:  Diagnosis Date   Allergy    allergic rhinitis   Asthma    Basal cell carcinoma 02/07/2020   R lower pretibia   DDD (degenerative disc disease)    low back pain deg disc dz with infections   Hearing loss    mild   Hyperlipidemia    Osteopenia    Osteoporosis    RAD (reactive airway disease), moderate persistent, uncomplicated    Squamous cell carcinoma of skin 08/10/2013   left dorsal hand/in situ   Tinnitus     Past Surgical History:  Procedure Laterality Date   ABDOMINAL HYSTERECTOMY  1989   total, fibroids   BACK SURGERY     COLONOSCOPY N/A 01/23/2022   Procedure: COLONOSCOPY;  Surgeon: [name], DO;  Location: [facility] ENDOSCOPY;  Service: Gastroenterology;  Laterality: N/A;   ESOPHAGOGASTRODUODENOSCOPY N/A 01/23/2022   Procedure: ESOPHAGOGASTRODUODENOSCOPY (EGD);  Surgeon: [name], DO;  Location: [facility] ENDOSCOPY;  Service: Gastroenterology;  Laterality: N/A;   EYE SURGERY     IR KYPHO EA ADDL LEVEL THORACIC OR LUMBAR  09/16/2023   IR KYPHO LUMBAR INC FX REDUCE BONE BX UNI/BIL CANNULATION INC/IMAGING  09/16/2023   IR RADIOLOGIST EVAL & MGMT  08/11/2023   IR RADIOLOGIST EVAL & MGMT  09/04/2023   IR RADIOLOGIST EVAL & MGMT  09/25/2023   KNEE CARTILAGE SURGERY     torn meniscus  2007   left    TRIGGER FINGER RELEASE Right    right, middle and inder fingers  Allergies: Alendronate sodium, Ceftin [cefuroxime], Ciprofloxacin, Clarithromycin, Codeine, Diclofenac sodium, Fish oil, Flagyl [metronidazole], Meloxicam, Penicillins, Prolia [denosumab], Rabeprazole sodium, Raloxifene, Sulfonamide derivatives, Azithromycin, and Sulfa antibiotics  Medications: Prior to Admission medications   Medication Sig Start Date End Date Taking? Authorizing Provider  acetaminophen (TYLENOL) 500 MG tablet Take 500 mg by mouth every 8 (eight) hours as needed for moderate pain (pain score 4-6).    [provider]  azelastine (ASTELIN)  0.1 % nasal spray Place into both nostrils 2 (two) times daily. Use in each nostril as directed    [provider]  bisoprolol (ZEBETA) 5 MG tablet Take 2.5 mg by mouth at bedtime.    [provider]  cholecalciferol (VITAMIN D) 1000 units tablet Take 4,000 Units by mouth daily.    [provider]  hydrocortisone 2.5 % cream Apply to affected area rash once to twice daily as directed. 06/03/23   Willeen Niece, MD  ketoconazole (NIZORAL) 2 % cream Apply to affected areas rash once to twice daily as directed. 06/03/23   Willeen Niece, MD  LUTEIN PO Take 1 tablet by mouth every other day.    [provider]  oxyCODONE-acetaminophen (PERCOCET/ROXICET) 5-325 MG tablet Take 1 tablet by mouth every 4 (four) hours as needed for severe pain (pain score 7-10).    [provider]  polyethylene glycol powder (GLYCOLAX/MIRALAX) 17 GM/SCOOP powder Take 1 Container by mouth once.    [provider]  vitamin B-12 (CYANOCOBALAMIN) 1000 MCG tablet Take 1,000 mcg by mouth once a week.    [provider]     Family History  Problem Relation Age of Onset   Osteoporosis Mother    Cancer Mother        ? liver cancer ? primary   Heart disease Father        MI   Osteoporosis Sister    Diabetes Sister    Heart disease Sister        CHF   Heart disease Brother        CAD   Heart disease Brother        CAD   Osteoporosis Sister    Diabetes Sister    Breast cancer Neg Hx     Social History   Socioeconomic History   Marital status: Married    Spouse name: Not on file   Number of children: Not on file   Years of education: Not on file   Highest education level: Not on file  Occupational History   Not on file  Tobacco Use   Smoking status: Never   Smokeless tobacco: Never  Vaping Use   Vaping status: Never Used  Substance and Sexual Activity   Alcohol use: No    Alcohol/week: 0.0 standard drinks of alcohol   Drug use: No   Sexual  activity: Never  Other Topics Concern   Not on file  Social History Narrative   Not on file   Social Drivers of Health   Financial Resource Strain: Low Risk  (09/07/2023)   Received [facility]   Overall Financial Resource Strain (CARDIA)    Difficulty of Paying Living Expenses: Not hard at all  Food Insecurity: No Food Insecurity (09/07/2023)   Received from [facility]   Hunger Vital Sign    Worried About Running Out of Food in the Last Year: Never true    Ran Out of Food in the Last Year: Never true  Transportation Needs:  No Transportation Needs (09/07/2023)   Received from [facility] - Transportation    In the past 12 months, has lack of transportation kept you from medical appointments or from getting medications?: No    Lack of Transportation (Non-Medical): No  Physical Activity: Inactive (08/18/2019)   Exercise Vital Sign    Days of Exercise per Week: 0 days    Minutes of Exercise per Session: 0 min  Stress: No Stress Concern Present (08/18/2019)   [employer] - Occupational Stress Questionnaire    Feeling of Stress : Not at all  Social Connections: Not on file    Review of Systems: A 12 point ROS discussed and pertinent positives are indicated in the HPI above.  All other systems are negative.  Review of Systems  Vital Signs: BP (!) 191/84 (BP Location: Right Arm)   Pulse 86   Resp 18   SpO2 99%    Physical Exam Constitutional:      General: She is in acute distress.     Appearance: Normal appearance.  HENT:     Head: Normocephalic and atraumatic.  Eyes:     General: No scleral icterus. Cardiovascular:     Rate and Rhythm: Tachycardia present.  Pulmonary:     Effort: Pulmonary effort is normal.  Abdominal:     General: There is no distension.     Tenderness: There is no abdominal tenderness.  Musculoskeletal:       Back:     Comments: Incision sites at L1  and L3 are well healed.  No erythema, induration or drainage.  Positive for TTP at T12 and L2  Skin:    General: Skin is warm and dry.  Neurological:     Mental Status: She is alert and oriented to person, place, and time.  Psychiatric:        Mood and Affect: Mood normal.        Behavior: Behavior normal.       Imaging: DG Lumbar Spine 2-3 Views Result Date: 09/25/2023 CLINICAL DATA:  78 year old female with recurrent severe back pain following L1 and L3 cement augmentation. Clinical concern for new fracture. EXAM: LUMBAR SPINE - 2-3 VIEW COMPARISON:  CT scan of the lumbar spine 09/07/2023 FINDINGS: New onset height loss at T12 measuring approximately 20%. Surgical changes of cement augmentation visualized at L1 and L3. No obvious fracture or height loss at L2, L4 or L5. Lower lumbar degenerative disc disease and facet arthropathy. IMPRESSION: 1. New height loss at T12 of approximately 12% concerning for and acute compression fracture. 2. Changes of cement augmentation at L1 and L3 without evidence of complication. Electronically Signed   By: Malachy Moan M.D.   On: 09/25/2023 15:02   IR Radiologist Eval & Mgmt Result Date: 09/25/2023 EXAM: ESTABLISHED PATIENT OFFICE VISIT CHIEF COMPLAINT: SEE NOTE IN EPIC HISTORY OF PRESENT ILLNESS: SEE NOTE IN EPIC REVIEW OF SYSTEMS: SEE NOTE IN EPIC PHYSICAL EXAMINATION: SEE NOTE IN EPIC ASSESSMENT AND PLAN: SEE NOTE IN EPIC Electronically Signed   By: Malachy Moan M.D.   On: 09/25/2023 14:06   IR KYPHO LUMBAR INC FX REDUCE BONE BX UNI/BIL CANNULATION INC/IMAGING Result Date: 09/16/2023 CLINICAL DATA:  78 year old female with highly symptomatic osteoporotic compression fractures of the L1 and L3 vertebral bodies. She presents for tube level cement augmentation with kyphoplasty. EXAM: FLUOROSCOPIC GUIDED KYPHOPLASTY OF THE L1 VERTEBRAL BODY FLUOROSCOPIC GUIDED KYPHOPLASTY OF THE L3 VERTEBRAL BODY COMPARISON:  None Available.  MEDICATIONS: As  antibiotic prophylaxis, 1 g vancomycin was ordered pre-procedure and administered intravenously within 1 hour of incision. ANESTHESIA/SEDATION: Moderate (conscious) sedation was employed during this procedure. A total of Versed 5 mg and Fentanyl 225 mcg was administered intravenously. Moderate Sedation Time: 74 minutes. The patient's level of consciousness and vital signs were monitored continuously by radiology nursing throughout the procedure under my direct supervision. FLUOROSCOPY TIME:  Radiation exposure index: 119.7 mGy reference air kerma COMPLICATIONS: None immediate. PROCEDURE: The procedure, risks (including but not limited to bleeding, infection, organ damage), benefits, and alternatives were explained to the patient. Questions regarding the procedure were encouraged and answered. The patient understands and consents to the procedure. The patient has suffered a fracture of the L1 and L3 vertebral bodies. It is recommended that patients aged 48 years or older be evaluated for possible testing or treatment of osteoporosis. A copy of this procedure report is sent to the patient's referring physician The patient was placed prone on the fluoroscopic table. The skin overlying the upper thoracic region was then prepped and draped in the usual sterile fashion. Maximal barrier sterile technique was utilized including caps, mask, sterile gowns, sterile gloves, sterile drape, hand hygiene and skin antiseptic. Intravenous Fentanyl and Versed were administered as conscious sedation during continuous cardiorespiratory monitoring by the radiology RN. The left pedicle at L1 was then infiltrated with 1% lidocaine followed by the advancement of a Kyphon trocar needle through the left pedicle into the posterior one-third of the vertebral body. Subsequently, the osteo drill was advanced to the anterior third of the vertebral body. The osteo drill was retracted. Through the working cannula, a Kyphon inflatable bone tamp 15 x  2.5 was advanced and positioned with the distal marker approximately 5 mm from the anterior aspect of the cortex. Appropriate positioning was confirmed on the AP projection. At this time, the balloon was expanded using contrast via a Kyphon inflation syringe device via micro tubing. In similar fashion, the right L1 pedicle was infiltrated with 1% lidocaine followed by the advancement of a second Kyphon trocar needle through the right pedicle into the posterior third of the vertebral body. Subsequently, the osteo drill was coaxially advanced to the anterior right third. The osteo drill was exchanged for a Kyphon inflatable bone tamp 15 x 2.5, advanced to the 5 mm of the anterior aspect of the cortex. The balloon was then expanded using contrast as above. Inflations were continued until there was near apposition with the superior end plate. Attention was next turned to the L3 level. The left pedicle at L3 was then infiltrated with 1% lidocaine followed by the advancement of a Kyphon trocar needle through the left pedicle into the posterior one-third of the vertebral body. Subsequently, the osteo drill was advanced to the anterior third of the vertebral body. The osteo drill was retracted. Through the working cannula, a Kyphon inflatable bone tamp 15 x 2.5 was advanced and positioned with the distal marker approximately 5 mm from the anterior aspect of the cortex. Appropriate positioning was confirmed on the AP projection. At this time, the balloon was expanded using contrast via a Kyphon inflation syringe device via micro tubing. In similar fashion, the right L3 pedicle was infiltrated with 1% lidocaine followed by the advancement of a second Kyphon trocar needle through the right pedicle into the posterior third of the vertebral body. Subsequently, the osteo drill was coaxially advanced to the anterior right third. The osteo drill was exchanged for a Kyphon inflatable bone tamp  15 x 2.5, advanced to the 5 mm of the  anterior aspect of the cortex. The balloon was then expanded using contrast as above. At this time, methylmethacrylate mixture was reconstituted in the Kyphon bone mixing device system. This was then loaded into the delivery mechanism, attached to Kyphon bone fillers. The balloons were deflated and removed followed by the instillation of methylmethacrylate mixture with excellent filling in the AP and lateral projections. Installation of methylmethacrylate was performed first at L3, and then at L1. No extravasation was noted in the disk spaces or posteriorly into the spinal canal. No epidural venous contamination was seen. The working cannulae and the bone filler were then retrieved and removed. Hemostasis was achieved with manual compression. The patient tolerated the procedure well without immediate postprocedural complication. IMPRESSION: 1. Technically successful L1 vertebral body augmentation using balloon kyphoplasty. 2. Technically successful L3 vertebral body augmentation using balloon kyphoplasty. 3. Per CMS PQRS reporting requirements (PQRS Measure 24): Given the patient's age of greater than 50 and the fracture site (hip, distal radius, or spine), the patient should be tested for osteoporosis using DXA, and the appropriate treatment considered based on the DXA results. Electronically Signed   By: Malachy Moan M.D.   On: 09/16/2023 11:48   IR KYPHO EA ADDL LEVEL THORACIC OR LUMBAR Result Date: 09/16/2023 CLINICAL DATA:  78 year old female with highly symptomatic osteoporotic compression fractures of the L1 and L3 vertebral bodies. She presents for tube level cement augmentation with kyphoplasty. EXAM: FLUOROSCOPIC GUIDED KYPHOPLASTY OF THE L1 VERTEBRAL BODY FLUOROSCOPIC GUIDED KYPHOPLASTY OF THE L3 VERTEBRAL BODY COMPARISON:  None Available. MEDICATIONS: As antibiotic prophylaxis, 1 g vancomycin was ordered pre-procedure and administered intravenously within 1 hour of incision. ANESTHESIA/SEDATION:  Moderate (conscious) sedation was employed during this procedure. A total of Versed 5 mg and Fentanyl 225 mcg was administered intravenously. Moderate Sedation Time: 74 minutes. The patient's level of consciousness and vital signs were monitored continuously by radiology nursing throughout the procedure under my direct supervision. FLUOROSCOPY TIME:  Radiation exposure index: 119.7 mGy reference air kerma COMPLICATIONS: None immediate. PROCEDURE: The procedure, risks (including but not limited to bleeding, infection, organ damage), benefits, and alternatives were explained to the patient. Questions regarding the procedure were encouraged and answered. The patient understands and consents to the procedure. The patient has suffered a fracture of the L1 and L3 vertebral bodies. It is recommended that patients aged 30 years or older be evaluated for possible testing or treatment of osteoporosis. A copy of this procedure report is sent to the patient's referring physician The patient was placed prone on the fluoroscopic table. The skin overlying the upper thoracic region was then prepped and draped in the usual sterile fashion. Maximal barrier sterile technique was utilized including caps, mask, sterile gowns, sterile gloves, sterile drape, hand hygiene and skin antiseptic. Intravenous Fentanyl and Versed were administered as conscious sedation during continuous cardiorespiratory monitoring by the radiology RN. The left pedicle at L1 was then infiltrated with 1% lidocaine followed by the advancement of a Kyphon trocar needle through the left pedicle into the posterior one-third of the vertebral body. Subsequently, the osteo drill was advanced to the anterior third of the vertebral body. The osteo drill was retracted. Through the working cannula, a Kyphon inflatable bone tamp 15 x 2.5 was advanced and positioned with the distal marker approximately 5 mm from the anterior aspect of the cortex. Appropriate positioning was  confirmed on the AP projection. At this time, the balloon was expanded using contrast  via a Kyphon inflation syringe device via micro tubing. In similar fashion, the right L1 pedicle was infiltrated with 1% lidocaine followed by the advancement of a second Kyphon trocar needle through the right pedicle into the posterior third of the vertebral body. Subsequently, the osteo drill was coaxially advanced to the anterior right third. The osteo drill was exchanged for a Kyphon inflatable bone tamp 15 x 2.5, advanced to the 5 mm of the anterior aspect of the cortex. The balloon was then expanded using contrast as above. Inflations were continued until there was near apposition with the superior end plate. Attention was next turned to the L3 level. The left pedicle at L3 was then infiltrated with 1% lidocaine followed by the advancement of a Kyphon trocar needle through the left pedicle into the posterior one-third of the vertebral body. Subsequently, the osteo drill was advanced to the anterior third of the vertebral body. The osteo drill was retracted. Through the working cannula, a Kyphon inflatable bone tamp 15 x 2.5 was advanced and positioned with the distal marker approximately 5 mm from the anterior aspect of the cortex. Appropriate positioning was confirmed on the AP projection. At this time, the balloon was expanded using contrast via a Kyphon inflation syringe device via micro tubing. In similar fashion, the right L3 pedicle was infiltrated with 1% lidocaine followed by the advancement of a second Kyphon trocar needle through the right pedicle into the posterior third of the vertebral body. Subsequently, the osteo drill was coaxially advanced to the anterior right third. The osteo drill was exchanged for a Kyphon inflatable bone tamp 15 x 2.5, advanced to the 5 mm of the anterior aspect of the cortex. The balloon was then expanded using contrast as above. At this time, methylmethacrylate mixture was  reconstituted in the Kyphon bone mixing device system. This was then loaded into the delivery mechanism, attached to Kyphon bone fillers. The balloons were deflated and removed followed by the instillation of methylmethacrylate mixture with excellent filling in the AP and lateral projections. Installation of methylmethacrylate was performed first at L3, and then at L1. No extravasation was noted in the disk spaces or posteriorly into the spinal canal. No epidural venous contamination was seen. The working cannulae and the bone filler were then retrieved and removed. Hemostasis was achieved with manual compression. The patient tolerated the procedure well without immediate postprocedural complication. IMPRESSION: 1. Technically successful L1 vertebral body augmentation using balloon kyphoplasty. 2. Technically successful L3 vertebral body augmentation using balloon kyphoplasty. 3. Per CMS PQRS reporting requirements (PQRS Measure 24): Given the patient's age of greater than 50 and the fracture site (hip, distal radius, or spine), the patient should be tested for osteoporosis using DXA, and the appropriate treatment considered based on the DXA results. Electronically Signed   By: Malachy Moan M.D.   On: 09/16/2023 11:48   CT LUMBAR SPINE WO CONTRAST Result Date: 09/07/2023 CLINICAL DATA:  L1 and L3 fractures. Possible vertebral augmentation. EXAM: CT LUMBAR SPINE WITHOUT CONTRAST TECHNIQUE: Multidetector CT imaging of the lumbar spine was performed without intravenous contrast administration. Multiplanar CT image reconstructions were also generated. RADIATION DOSE REDUCTION: This exam was performed according to the departmental dose-optimization program which includes automated exposure control, adjustment of the mA and/or kV according to patient size and/or use of iterative reconstruction technique. COMPARISON:  MRI 08/06/2023 FINDINGS: Segmentation: 5 lumbar type vertebral bodies. Alignment: Some kyphotic  curvature at the L1 level because of the newly seen fracture. Vertebrae: Newly seen burst  compression fracture of the L1 vertebral body with loss of height anteriorly of 80% and posteriorly of 30%. Posterior bowing of the posterosuperior margin of the vertebral body by 5-6 mm. AO spine classification A3. Paraspinal and other soft tissues: No significant finding otherwise. Disc levels: Mild canal stenosis at the L1 level but without definite compression of the neural structures. L2-3 disc bulge without compressive stenosis. L3-4 disc bulge without compressive stenosis. L4-5 disc degeneration with loss of disc height. Disc bulge. No compressive stenosis. L5-S1 disc bulge and mild facet hypertrophy without compressive stenosis. IMPRESSION: 1. Newly seen burst compression fracture of the L1 vertebral body with loss of height anteriorly of 80% and posteriorly of 30%. Posterior bowing of the posterosuperior margin of the vertebral body by 5-6 mm. AO spine classification A3. Mild canal stenosis at the L1 level but without definite compression of the neural structures. 2. Minimal further progressive collapse at L3 since the MRI of 08/06/2023. 3. Degenerative disc disease at L2-3, L3-4, L4-5 and L5-S1 without compressive stenosis. Electronically Signed   By: Paulina Fusi M.D.   On: 09/07/2023 14:11   IR Radiologist Eval & Mgmt Result Date: 09/04/2023 EXAM: ESTABLISHED PATIENT OFFICE VISIT CHIEF COMPLAINT: SEE NOTE IN EMR. HISTORY OF PRESENT ILLNESS: SEE NOTE IN EMR. REVIEW OF SYSTEMS: SEE NOTE IN EMR. PHYSICAL EXAMINATION: SEE NOTE IN EMR. ASSESSMENT AND PLAN: SEE NOTE IN EMR. Electronically Signed   By: Orvan Falconer M.D.   On: 09/04/2023 17:36    Labs:  CBC: No results for input(s): "WBC", "HGB", "HCT", "PLT" in the last 8760 hours.  COAGS: No results for input(s): "INR", "APTT" in the last 8760 hours.  BMP: No results for input(s): "NA", "K", "CL", "CO2", "GLUCOSE", "BUN", "CALCIUM", "CREATININE",  "GFRNONAA", "GFRAA" in the last 8760 hours.  Invalid input(s): "CMP"  LIVER FUNCTION TESTS: No results for input(s): "BILITOT", "AST", "ALT", "ALKPHOS", "PROT", "ALBUMIN" in the last 8760 hours.  TUMOR MARKERS: No results for input(s): "AFPTM", "CEA", "CA199", "CHROMGRNA" in the last 8760 hours.  Assessment and Plan:  Unfortunate 78 year old female with recurrent severe back pain.  She underwent L1 and L3 kyphoplasty on 09/16/2023.  She felt significant improvement that day, but the following several days developed recurrent back pain.  Initially, she thought this was simply pain related to the procedure but her pain has increased and become more significant.  On physical exam, she has focal tenderness to palpation at the T12 vertebral body as well as between the previously treated levels at L2.  A conventional x-ray shows new height loss at T12 highly concerning for a new fracture.  Additionally, her clinical exam at L2 raises concern for another fracture.  We need to pursue dedicated cross-sectional imaging, preferably MRI although a CT scan will be acceptable if her insurance company gives her a hard time.  Assuming that she has new areas of fracture, she would likely benefit from further cement augmentation.  1.) MRI lumbar spine (include T12) or CT lumbar spine (include T12) 2.) F/U call next week to discuss 3.) Add Aleve 500 mg BID to pain regimen  Electronically Signed: Kandis Cocking Marcianna Daily 09/25/2023, 3:15 PM   I spent a total of 15 Minutes in face to face in clinical consultation, greater than 50% of which was counseling/coordinating care for back pain

## 2023-09-28 ENCOUNTER — Other Ambulatory Visit: Payer: Self-pay | Admitting: Interventional Radiology

## 2023-09-28 DIAGNOSIS — S22080A Wedge compression fracture of T11-T12 vertebra, initial encounter for closed fracture: Secondary | ICD-10-CM

## 2023-09-28 DIAGNOSIS — S32010A Wedge compression fracture of first lumbar vertebra, initial encounter for closed fracture: Secondary | ICD-10-CM

## 2023-09-29 ENCOUNTER — Ambulatory Visit
Admission: RE | Admit: 2023-09-29 | Discharge: 2023-09-29 | Disposition: A | Discharge status: Home / Self Care | Payer: Medicare HMO | Source: Ambulatory Visit | Attending: Interventional Radiology | Admitting: Interventional Radiology

## 2023-09-29 ENCOUNTER — Ambulatory Visit: Payer: Medicare HMO

## 2023-09-29 DIAGNOSIS — S22080A Wedge compression fracture of T11-T12 vertebra, initial encounter for closed fracture: Secondary | ICD-10-CM

## 2023-09-29 DIAGNOSIS — S32010A Wedge compression fracture of first lumbar vertebra, initial encounter for closed fracture: Secondary | ICD-10-CM

## 2023-09-29 HISTORY — PX: IR RADIOLOGIST EVAL & MGMT: IMG5224

## 2023-09-29 NOTE — Progress Notes (Signed)
Chief Complaint: Patient was consulted remotely today (TeleHealth) for new osteoporotic fracture of T12 at the request of [name].    Referring Physician(s): Candies Palm K  History of Present Illness:  Follow-up phone call after our clinic visit this past Friday.  I spoke to Jody Avery husband this morning.  She was trying to rest due to her pain and discomfort.  I explained that the CT scan does demonstrate a new fracture at T12 as well as a fracture of the neck of the right 12th rib.  No fracture evident at L2 which is good news.  She continues to have severe pain.  The addition of Aleve to her pain regimen has helped somewhat but is not sufficient.  They have and continue to be involved with physical therapy which has not been helpful for her.  Additionally, she has a TLSO brace but every time she wears this it makes her pain worse.  Past Medical History:  Diagnosis Date   Allergy    allergic rhinitis   Asthma    Basal cell carcinoma 02/07/2020   R lower pretibia   DDD (degenerative disc disease)    low back pain deg disc dz with infections   Hearing loss    mild   Hyperlipidemia    Osteopenia    Osteoporosis    RAD (reactive airway disease), moderate persistent, uncomplicated    Squamous cell carcinoma of skin 08/10/2013   left dorsal hand/in situ   Tinnitus     Past Surgical History:  Procedure Laterality Date   ABDOMINAL HYSTERECTOMY  1989   total, fibroids   BACK SURGERY     COLONOSCOPY N/A 01/23/2022   Procedure: COLONOSCOPY;  Surgeon: [name], DO;  Location: [facility] ENDOSCOPY;  Service: Gastroenterology;  Laterality: N/A;   ESOPHAGOGASTRODUODENOSCOPY N/A 01/23/2022   Procedure: ESOPHAGOGASTRODUODENOSCOPY (EGD);  Surgeon: [name], DO;  Location: [facility] ENDOSCOPY;  Service: Gastroenterology;  Laterality: N/A;   EYE SURGERY     IR KYPHO EA ADDL LEVEL THORACIC OR LUMBAR  09/16/2023   IR KYPHO LUMBAR INC FX REDUCE BONE BX  UNI/BIL CANNULATION INC/IMAGING  09/16/2023   IR RADIOLOGIST EVAL & MGMT  08/11/2023   IR RADIOLOGIST EVAL & MGMT  09/04/2023   IR RADIOLOGIST EVAL & MGMT  09/25/2023   IR RADIOLOGIST EVAL & MGMT  09/29/2023   KNEE CARTILAGE SURGERY     torn meniscus  2007   left    TRIGGER FINGER RELEASE Right    right, middle and inder fingers    Allergies: Alendronate sodium, Ceftin [cefuroxime], Ciprofloxacin, Clarithromycin, Codeine, Diclofenac sodium, Fish oil, Flagyl [metronidazole], Meloxicam, Penicillins, Prolia [denosumab], Rabeprazole sodium, Raloxifene, Sulfonamide derivatives, Azithromycin, and Sulfa antibiotics  Medications: Prior to Admission medications   Medication Sig Start Date End Date Taking? Authorizing Provider  acetaminophen (TYLENOL) 500 MG tablet Take 500 mg by mouth every 8 (eight) hours as needed for moderate pain (pain score 4-6).    [provider]  azelastine (ASTELIN) 0.1 % nasal spray Place into both nostrils 2 (two) times daily. Use in each nostril as directed    [provider]  bisoprolol (ZEBETA) 5 MG tablet Take 2.5 mg by mouth at bedtime.    [provider]  cholecalciferol (VITAMIN D) 1000 units tablet Take 4,000 Units by mouth daily.    [provider]  hydrocortisone 2.5 % cream Apply to affected area rash once to twice daily as directed. 06/03/23   Willeen Niece, MD  ketoconazole (  NIZORAL) 2 % cream Apply to affected areas rash once to twice daily as directed. 06/03/23   Willeen Niece, MD  LUTEIN PO Take 1 tablet by mouth every other day.    [provider]  oxyCODONE-acetaminophen (PERCOCET/ROXICET) 5-325 MG tablet Take 1 tablet by mouth every 4 (four) hours as needed for severe pain (pain score 7-10).    [provider]  polyethylene glycol powder (GLYCOLAX/MIRALAX) 17 GM/SCOOP powder Take 1 Container by mouth once.    [provider]  vitamin B-12 (CYANOCOBALAMIN) 1000 MCG tablet Take 1,000 mcg by mouth  once a week.    [provider]     Family History  Problem Relation Age of Onset   Osteoporosis Mother    Cancer Mother        ? liver cancer ? primary   Heart disease Father        MI   Osteoporosis Sister    Diabetes Sister    Heart disease Sister        CHF   Heart disease Brother        CAD   Heart disease Brother        CAD   Osteoporosis Sister    Diabetes Sister    Breast cancer Neg Hx     Social History   Socioeconomic History   Marital status: Married    Spouse name: Not on file   Number of children: Not on file   Years of education: Not on file   Highest education level: Not on file  Occupational History   Not on file  Tobacco Use   Smoking status: Never   Smokeless tobacco: Never  Vaping Use   Vaping status: Never Used  Substance and Sexual Activity   Alcohol use: No    Alcohol/week: 0.0 standard drinks of alcohol   Drug use: No   Sexual activity: Never  Other Topics Concern   Not on file  Social History Narrative   Not on file   Social Drivers of Health   Financial Resource Strain: Low Risk  (09/07/2023)   Received from [facility]   Overall Financial Resource Strain (CARDIA)    Difficulty of Paying Living Expenses: Not hard at all  Food Insecurity: No Food Insecurity (09/07/2023)   Received from [facility]   Hunger Vital Sign    Worried About Running Out of Food in the Last Year: Never true    Ran Out of Food in the Last Year: Never true  Transportation Needs: No Transportation Needs (09/07/2023)   Received from [facility] - Transportation    In the past 12 months, has lack of transportation kept you from medical appointments or from getting medications?: No    Lack of Transportation (Non-Medical): No  Physical Activity: Inactive (08/18/2019)   Exercise Vital Sign    Days of Exercise per Week: 0 days    Minutes of Exercise per Session: 0 min  Stress: No Stress Concern  Present (08/18/2019)   [employer] - Occupational Stress Questionnaire    Feeling of Stress : Not at all  Social Connections: Not on file   Review of Systems  Review of Systems: A 12 point ROS discussed and pertinent positives are indicated in the HPI above.  All other systems are negative.   Physical Exam No direct physical exam was performed (except for noted visual exam findings with Video Visits).  Vital Signs: There were no vitals taken for this visit.  Imaging:  CT LUMBAR SPINE 09/25/23  IMPRESSION: 1. New superior endplate fracture at T12 with 30% loss of height superiorly. 2. Interval spinal augmentation at the L1 and L3 fractures. No significant cement extrusion is present. 3. Focal kyphosis centered at the L1 vertebral plana compression fracture in the new T12 fracture. 4. Mild foraminal narrowing bilaterally at L4-5, right greater than left. 5. Cholelithiasis. 6.  Aortic Atherosclerosis (ICD10-I70.0). ADDENDUM: Focal angulation in the proximal right twelfth rib is consistent with an acute rib fracture as well.   Assessment and Plan:  78 year old female with confirmed new spontaneous osteoporotic fracture of T12 with an additional fracture of the neck of the right 12th rib.  Patient remains in significant pain.  The combination of hydrocodone, Tylenol and Aleve is insufficient to relieve her pain.  Physical therapy has been ongoing and unhelpful.  Every time she tries to wear her back brace, it exacerbates her pain.  She is extremely interested in pursuing additional cement augmentation with kyphoplasty at T12 in an effort to relieve her pain.  1.)  Please schedule for T12 cement augmentation with balloon kyphoplasty as soon as feasible.      Electronically Signed: Kandis Cocking Elzie Knisley 09/29/2023, 11:57 AM

## 2023-10-01 ENCOUNTER — Other Ambulatory Visit: Payer: Self-pay | Admitting: Interventional Radiology

## 2023-10-01 ENCOUNTER — Ambulatory Visit
Admission: RE | Admit: 2023-10-01 | Discharge: 2023-10-01 | Disposition: A | Discharge status: Home / Self Care | Payer: Medicare HMO | Source: Ambulatory Visit | Attending: Interventional Radiology | Admitting: Interventional Radiology

## 2023-10-01 DIAGNOSIS — M4854XG Collapsed vertebra, not elsewhere classified, thoracic region, subsequent encounter for fracture with delayed healing: Secondary | ICD-10-CM

## 2023-10-01 DIAGNOSIS — M8008XA Age-related osteoporosis with current pathological fracture, vertebra(e), initial encounter for fracture: Secondary | ICD-10-CM

## 2023-10-08 ENCOUNTER — Other Ambulatory Visit: Payer: Self-pay | Admitting: Interventional Radiology

## 2023-10-08 DIAGNOSIS — S22080A Wedge compression fracture of T11-T12 vertebra, initial encounter for closed fracture: Secondary | ICD-10-CM

## 2023-10-12 NOTE — Discharge Instructions (Addendum)
 Kyphoplasty Post Procedure Discharge Instructions  May resume a regular diet and any medications that you routinely take (including pain medications). However, if you are taking Aspirin or an anticoagulant/blood thinner you will be told when you can resume taking these by the healthcare provider. No driving day of procedure. The day of your procedure take it easy. You may use an ice pack as needed to injection sites on back.  Ice to back 30 minutes on and 30 minutes off, as needed. May remove bandaids tomorrow after taking a shower. Replace daily with a clean bandaid until healed.  Do not lift anything heavier than a milk jug for 1-2 weeks or determined by your physician.  Follow up with your physician in 2 weeks.    Please contact our office at [phone] for the following symptoms or if you have any questions:  Fever greater than 100 degrees Increased swelling, pain, or redness at injection site. Increased back and/or leg pain New numbness or change in symptoms from before the procedure.   If you need to speak to someone after hours please call the on call IR MD at [phone]. Tell them you are a patient of Dr. Marne Sings and you had a KP today and any issues you are having.                                                                              Thank you for visiting [facility]

## 2023-10-13 ENCOUNTER — Ambulatory Visit
Admission: RE | Admit: 2023-10-13 | Discharge: 2023-10-13 | Disposition: A | Discharge status: Home / Self Care | Payer: Medicare HMO | Source: Ambulatory Visit | Attending: Interventional Radiology | Admitting: Interventional Radiology

## 2023-10-13 DIAGNOSIS — S22080A Wedge compression fracture of T11-T12 vertebra, initial encounter for closed fracture: Secondary | ICD-10-CM

## 2023-10-13 HISTORY — PX: IR KYPHO THORACIC WITH BONE BIOPSY: IMG5518

## 2023-10-13 MED ORDER — ACETAMINOPHEN 10 MG/ML IV SOLN
1000.0000 mg | Freq: Once | INTRAVENOUS | Status: AC
  Administered 2023-10-13: 1000 mg via INTRAVENOUS

## 2023-10-13 MED ORDER — FENTANYL CITRATE PF 50 MCG/ML IJ SOSY: 25.0000 ug | PREFILLED_SYRINGE | INTRAMUSCULAR | Status: DC | PRN

## 2023-10-13 MED ORDER — SODIUM CHLORIDE 0.9 % IV SOLN: INTRAVENOUS | Status: DC

## 2023-10-13 MED ORDER — MIDAZOLAM HCL 2 MG/2ML IJ SOLN: 1.0000 mg | INTRAMUSCULAR | Status: DC | PRN

## 2023-10-13 MED ORDER — FENTANYL CITRATE (PF) 100 MCG/2ML IJ SOLN
INTRAMUSCULAR | Status: AC | PRN
  Administered 2023-10-13 (×2): 50 ug via INTRAVENOUS

## 2023-10-13 MED ORDER — VANCOMYCIN HCL IN DEXTROSE 1-5 GM/200ML-% IV SOLN
1000.0000 mg | INTRAVENOUS | Status: AC
  Administered 2023-10-13: 1000 mg via INTRAVENOUS

## 2023-10-13 MED ORDER — MIDAZOLAM HCL 2 MG/2ML IJ SOLN
INTRAMUSCULAR | Status: AC | PRN
  Administered 2023-10-13 (×2): 1 mg via INTRAVENOUS
  Administered 2023-10-13: 2 mg via INTRAVENOUS
  Administered 2023-10-13 (×2): 1 mg via INTRAVENOUS

## 2023-10-15 ENCOUNTER — Telehealth: Payer: Self-pay

## 2023-10-20 ENCOUNTER — Other Ambulatory Visit: Payer: Self-pay | Admitting: Interventional Radiology

## 2023-10-20 ENCOUNTER — Telehealth: Payer: Self-pay

## 2023-10-20 ENCOUNTER — Encounter: Payer: Self-pay | Admitting: Podiatry

## 2023-10-20 DIAGNOSIS — G8918 Other acute postprocedural pain: Secondary | ICD-10-CM

## 2023-10-20 DIAGNOSIS — M4854XG Collapsed vertebra, not elsewhere classified, thoracic region, subsequent encounter for fracture with delayed healing: Secondary | ICD-10-CM

## 2023-10-20 NOTE — Telephone Encounter (Signed)
 Pt had KP of T12 10/13/23 and KP of L3  region 09/16/23. Pt still having dull pain below the L3 region. Pt reports 5/10 pain and taking oxycodone to help with the pain. Dr. Archer Asa notified, MRI of lumbar spine without contrast ordered.

## 2023-10-21 ENCOUNTER — Other Ambulatory Visit: Payer: Medicare HMO

## 2023-10-21 ENCOUNTER — Other Ambulatory Visit: Payer: Self-pay | Admitting: Interventional Radiology

## 2023-10-21 DIAGNOSIS — G8918 Other acute postprocedural pain: Secondary | ICD-10-CM

## 2023-10-21 DIAGNOSIS — M4854XG Collapsed vertebra, not elsewhere classified, thoracic region, subsequent encounter for fracture with delayed healing: Secondary | ICD-10-CM

## 2023-10-24 ENCOUNTER — Ambulatory Visit
Admission: RE | Admit: 2023-10-24 | Discharge: 2023-10-24 | Disposition: A | Discharge status: Home / Self Care | Payer: Medicare HMO | Source: Ambulatory Visit | Attending: Interventional Radiology | Admitting: Interventional Radiology

## 2023-10-24 DIAGNOSIS — M4854XG Collapsed vertebra, not elsewhere classified, thoracic region, subsequent encounter for fracture with delayed healing: Secondary | ICD-10-CM

## 2023-10-24 DIAGNOSIS — G8918 Other acute postprocedural pain: Secondary | ICD-10-CM

## 2023-10-27 ENCOUNTER — Ambulatory Visit: Payer: Medicare HMO

## 2023-10-27 ENCOUNTER — Ambulatory Visit
Admission: RE | Admit: 2023-10-27 | Discharge: 2023-10-27 | Disposition: A | Discharge status: Home / Self Care | Payer: Medicare HMO | Source: Ambulatory Visit | Attending: Interventional Radiology | Admitting: Interventional Radiology

## 2023-10-27 DIAGNOSIS — M4854XG Collapsed vertebra, not elsewhere classified, thoracic region, subsequent encounter for fracture with delayed healing: Secondary | ICD-10-CM

## 2023-10-27 DIAGNOSIS — G8918 Other acute postprocedural pain: Secondary | ICD-10-CM

## 2023-10-27 HISTORY — PX: IR RADIOLOGIST EVAL & MGMT: IMG5224

## 2023-10-27 NOTE — Progress Notes (Signed)
 Chief Complaint: Patient was seen in consultation today for osteoporotic compression fractures of T12, L1 and L1 post KP with persistent back pain at the request of Toshiro Hanken K  Referring Physician(s): Katty Fretwell K  History of Present Illness: Jody Avery is a 78 y.o. female with a history of progressive lumbar compression fractures.  She was first seen by my partner Dr. Loreta Ave on 08/11/2023 with an acute L3 compression fracture.  She developed progressive symptoms and was seen again by another of my partners, Dr. Ernestina Penna on 09/04/2023.  Imaging performed at that time demonstrates a new L1 compression fracture with severe height loss.   I became involved with her care on the day of her procedure and performed a successful L1 and L3 kyphoplasty on 09/16/2023.  The procedure went well and she was discharged home in good condition.   Unfortunately, she subsequently developed a new acute T12 compression fracture which was severely symptomatic and required additional kyphoplasty on 10/13/2023.  We met today for her 2-week follow-up evaluation.  She is accompanied by her husband.  She is pleased to report that the severe fracture related pain has resolved.  She is now able to get into the bed and able to walk throughout the house utilizing her walker.  However, she continues to have symptoms of intermittent and migratory back pain.  She has back pain occasionally in the right lower back that radiates into the right hip and thigh.  Sometimes her pain is on the left.  Additionally, she has intermittent bilateral rib pain and occasionally pain underneath her left breast.  She has an order for home physical therapy which has been on hold until we determine if her fractures were healed.  Her most recent MRI demonstrates no new acute fracture.  She should be cleared to return to physical therapy at this point.  She continues to take one half of a 5/325 hydrocodone/acetaminophen every  4 hours for pain.  She has been taking this since before Thanksgiving.  She is likely developing some physical dependence on the medication and may be having some rebound pain.  She reports that she wakes up every 4 hours at night hurting ready for her next pain pill.  She does not want to continue the hydrocodone but is afraid she will have too severe pain if she stops.  Past Medical History:  Diagnosis Date   Allergy    allergic rhinitis   Asthma    Basal cell carcinoma 02/07/2020   R lower pretibia   DDD (degenerative disc disease)    low back pain deg disc dz with infections   Hearing loss    mild   Hyperlipidemia    Osteopenia    Osteoporosis    RAD (reactive airway disease), moderate persistent, uncomplicated    Squamous cell carcinoma of skin 08/10/2013   left dorsal hand/in situ   Tinnitus     Past Surgical History:  Procedure Laterality Date   ABDOMINAL HYSTERECTOMY  1989   total, fibroids   BACK SURGERY     COLONOSCOPY N/A 01/23/2022   Procedure: COLONOSCOPY;  Surgeon: [name], DO;  Location: [facility] ENDOSCOPY;  Service: Gastroenterology;  Laterality: N/A;   ESOPHAGOGASTRODUODENOSCOPY N/A 01/23/2022   Procedure: ESOPHAGOGASTRODUODENOSCOPY (EGD);  Surgeon: [name], DO;  Location: [facility] ENDOSCOPY;  Service: Gastroenterology;  Laterality: N/A;   EYE SURGERY     IR KYPHO EA ADDL LEVEL THORACIC OR LUMBAR  09/16/2023   IR KYPHO LUMBAR INC FX REDUCE  BONE BX UNI/BIL CANNULATION INC/IMAGING  09/16/2023   IR KYPHO THORACIC WITH BONE BIOPSY  10/13/2023   IR RADIOLOGIST EVAL & MGMT  08/11/2023   IR RADIOLOGIST EVAL & MGMT  09/04/2023   IR RADIOLOGIST EVAL & MGMT  09/25/2023   IR RADIOLOGIST EVAL & MGMT  09/29/2023   IR RADIOLOGIST EVAL & MGMT  10/27/2023   KNEE CARTILAGE SURGERY     torn meniscus  2007   left    TRIGGER FINGER RELEASE Right    right, middle and inder fingers    Allergies: Alendronate sodium, Codeine, Ceftin [cefuroxime], Meloxicam,  Penicillins, Prolia [denosumab], Raloxifene, Azithromycin, Ciprofloxacin, Clarithromycin, Diclofenac sodium, Fish oil, Flagyl [metronidazole], Rabeprazole sodium, Sulfa antibiotics, and Sulfonamide derivatives  Medications: Prior to Admission medications   Medication Sig Start Date End Date Taking? Authorizing Provider  acetaminophen (TYLENOL) 500 MG tablet Take 500 mg by mouth every 8 (eight) hours as needed for moderate pain (pain score 4-6).    [provider]  azelastine (ASTELIN) 0.1 % nasal spray Place into both nostrils 2 (two) times daily. Use in each nostril as directed    [provider]  bisoprolol (ZEBETA) 5 MG tablet Take 2.5 mg by mouth at bedtime.    [provider]  cholecalciferol (VITAMIN D) 1000 units tablet Take 4,000 Units by mouth daily.    [provider]  hydrocortisone 2.5 % cream Apply to affected area rash once to twice daily as directed. 06/03/23   Willeen Niece, MD  ketoconazole (NIZORAL) 2 % cream Apply to affected areas rash once to twice daily as directed. 06/03/23   Willeen Niece, MD  LUTEIN PO Take 1 tablet by mouth every other day.    [provider]  oxyCODONE-acetaminophen (PERCOCET/ROXICET) 5-325 MG tablet Take 1 tablet by mouth every 4 (four) hours as needed for severe pain (pain score 7-10).    [provider]  polyethylene glycol powder (GLYCOLAX/MIRALAX) 17 GM/SCOOP powder Take 1 Container by mouth once.    [provider]  vitamin B-12 (CYANOCOBALAMIN) 1000 MCG tablet Take 1,000 mcg by mouth once a week.    [provider]     Family History  Problem Relation Age of Onset   Osteoporosis Mother    Cancer Mother        ? liver cancer ? primary   Heart disease Father        MI   Osteoporosis Sister    Diabetes Sister    Heart disease Sister        CHF   Heart disease Brother        CAD   Heart disease Brother        CAD   Osteoporosis Sister    Diabetes Sister    Breast  cancer Neg Hx     Social History   Socioeconomic History   Marital status: Married    Spouse name: Not on file   Number of children: Not on file   Years of education: Not on file   Highest education level: Not on file  Occupational History   Not on file  Tobacco Use   Smoking status: Never   Smokeless tobacco: Never  Vaping Use   Vaping status: Never Used  Substance and Sexual Activity   Alcohol use: No    Alcohol/week: 0.0 standard drinks of alcohol   Drug use: No   Sexual activity: Never  Other Topics Concern   Not on file  Social History Narrative  Not on file   Social Drivers of Health   Financial Resource Strain: Low Risk  (09/07/2023)   Received from [facility]   Overall Financial Resource Strain (CARDIA)    Difficulty of Paying Living Expenses: Not hard at all  Food Insecurity: No Food Insecurity (09/07/2023)   Received from [facility]   Hunger Vital Sign    Worried About Running Out of Food in the Last Year: Never true    Ran Out of Food in the Last Year: Never true  Transportation Needs: No Transportation Needs (09/07/2023)   Received from [facility] - Transportation    In the past 12 months, has lack of transportation kept you from medical appointments or from getting medications?: No    Lack of Transportation (Non-Medical): No  Physical Activity: Inactive (08/18/2019)   Exercise Vital Sign    Days of Exercise per Week: 0 days    Minutes of Exercise per Session: 0 min  Stress: No Stress Concern Present (08/18/2019)   [employer] - Occupational Stress Questionnaire    Feeling of Stress : Not at all  Social Connections: Not on file    Review of Systems: A 12 point ROS discussed and pertinent positives are indicated in the HPI above.  All other systems are negative.  Review of Systems  Vital Signs: BP (!) 161/78   Pulse 88   Temp (!) 97 F (36.1 C) (Oral)    Resp 19   Advance Care Plan: The advanced care plan/surrogate decision maker was discussed at the time of visit and the patient did not wish to discuss or was not able to name a surrogate decision maker or provide an advance care plan.    Physical Exam Constitutional:      General: She is not in acute distress.    Appearance: Normal appearance. She is obese.  HENT:     Head: Normocephalic and atraumatic.  Eyes:     General: No scleral icterus. Cardiovascular:     Rate and Rhythm: Normal rate.  Pulmonary:     Effort: Pulmonary effort is normal.  Abdominal:     General: There is no distension.     Tenderness: There is no abdominal tenderness.  Musculoskeletal:     Comments: Scattered mild TTP along the right 12th rib, the right paraspinal muscles in the lower back and the left lateral ribs.    Skin:    General: Skin is warm and dry.  Neurological:     Mental Status: She is alert and oriented to person, place, and time.  Psychiatric:        Mood and Affect: Mood normal.        Behavior: Behavior normal.       Imaging: IR Radiologist Eval & Mgmt Result Date: 10/27/2023 EXAM: ESTABLISHED PATIENT OFFICE VISIT CHIEF COMPLAINT: SEE NOTE IN EPIC HISTORY OF PRESENT ILLNESS: SEE NOTE IN EPIC REVIEW OF SYSTEMS: SEE NOTE IN EPIC PHYSICAL EXAMINATION: SEE NOTE IN EPIC ASSESSMENT AND PLAN: SEE NOTE IN EPIC Electronically Signed   By: Malachy Moan M.D.   On: 10/27/2023 15:09   MR LUMBAR SPINE WO CONTRAST Result Date: 10/27/2023 CLINICAL DATA:  Recent kyphoplasty with persistent pain. EXAM: MRI LUMBAR SPINE WITHOUT CONTRAST TECHNIQUE: Multiplanar, multisequence MR imaging of the lumbar spine was performed. No intravenous contrast was administered. COMPARISON:  10/01/2023 FINDINGS: Segmentation:  Standard. Alignment:  Unremarkable. Vertebrae: Marrow edema at T12 with interval  changes of cement augmentation. Moderate height loss and mild retropulsion is not significantly changed; expected  distribution of cement. More remote compression fractures at L1 and L3 with pre-existing cement augmentation and lesser marrow edema. No new fracture. No evidence of bone lesion. Conus medullaris and cauda equina: Conus extends to the T12-L1 level. Conus and cauda equina appear normal. Paraspinal and other soft tissues: Cholelithiasis. No perispinal hematoma. Disc levels: T12- L1: Posttraumatic disc distortion with mild left foraminal narrowing. L1-L2: Unremarkable. L2-L3: Mild disc bulging.  No neural compression L3-L4: Disc bulging greatest at the foramina. Degenerative facet spurring on both sides. Patent canal and foramina L4-L5: Disc collapse and endplate degeneration with mild bulging and facet spurring. Mild triangular narrowing of the thecal sac crowding both subarticular recesses, stable. L5-S1:Mild disc bulging and facet spurring. Central disc material contacts the right S1 nerve root without static compression. IMPRESSION: Interval T12 kyphoplasty without unexpected or progressive finding. Stable more remote and treated L1 and L3 compression fractures. No acute fracture; no new cause for pain. Cholelithiasis. Electronically Signed   By: Tiburcio Pea M.D.   On: 10/27/2023 08:48   IR KYPHO THORACIC WITH BONE BIOPSY Result Date: 10/13/2023 CLINICAL DATA:  78 year old female with highly symptomatic osteoporotic compression fracture of T12. She presents for cement augmentation with balloon kyphoplasty. EXAM: FLUOROSCOPIC GUIDED KYPHOPLASTY OF THE T12 VERTEBRAL BODY COMPARISON:  None Available. MEDICATIONS: As antibiotic prophylaxis, 1 g vancomycin was ordered pre-procedure and administered intravenously within 1 hour of incision. ANESTHESIA/SEDATION: Moderate (conscious) sedation was employed during this procedure. A total of Versed 6 mg and Fentanyl 100 mcg was administered intravenously by the Radiology nurse. Moderate Sedation Time: 30 minutes. The patient's level of consciousness and vital signs were  monitored continuously by radiology nursing throughout the procedure under my direct supervision. FLUOROSCOPY TIME:  Radiation exposure index: 50.6 mGy reference air kerma COMPLICATIONS: None immediate. PROCEDURE: The procedure, risks (including but not limited to bleeding, infection, organ damage), benefits, and alternatives were explained to the patient. Questions regarding the procedure were encouraged and answered. The patient understands and consents to the procedure. The patient was placed prone on the fluoroscopic table. The skin overlying the thoracic region was then prepped and draped in the usual sterile fashion. Maximal barrier sterile technique was utilized including caps, mask, sterile gowns, sterile gloves, sterile drape, hand hygiene and skin antiseptic. Intravenous Fentanyl and Versed were administered as conscious sedation during continuous cardiorespiratory monitoring by the radiology RN. The left pedicle at T12 was then infiltrated with 1% lidocaine followed by the advancement of a Kyphon trocar needle through the left pedicle into the posterior one-third of the vertebral body. Subsequently, the osteo drill was advanced to the anterior third of the vertebral body. The osteo drill was retracted. Through the working cannula, a Kyphon inflatable bone tamp 15 x 2.5 was advanced and positioned with the distal marker approximately 5 mm from the anterior aspect of the cortex. Appropriate positioning was confirmed on the AP projection. At this time, the balloon was expanded using contrast via a Kyphon inflation syringe device via micro tubing. In similar fashion, the right T12 pedicle was infiltrated with 1% lidocaine followed by the advancement of a second Kyphon trocar needle through the right pedicle into the posterior third of the vertebral body. Subsequently, the osteo drill was coaxially advanced to the anterior right third. The osteo drill was exchanged for a Kyphon inflatable bone tamp 15 x 2.5,  advanced to the 5 mm of the anterior aspect of the  cortex. The balloon was then expanded using contrast as above. Inflations were continued until there was near apposition with the superior end plate. At this time, methylmethacrylate mixture was reconstituted in the Kyphon bone mixing device system. This was then loaded into the delivery mechanism, attached to the cement delivery system. The balloons were deflated and removed followed by the instillation of methylmethacrylate mixture with excellent filling in the AP and lateral projections. No extravasation was noted in the disk spaces or posteriorly into the spinal canal. No epidural venous contamination was seen. The working cannulae and the bone filler were then retrieved and removed. Hemostasis was achieved with manual compression. The patient tolerated the procedure well without immediate postprocedural complication. IMPRESSION: 1. Technically successful T12 vertebral body augmentation using balloon kyphoplasty. 2. Per CMS PQRS reporting requirements (PQRS Measure 24): Given the patient's age of greater than 50 and the fracture site (hip, distal radius, or spine), the patient should be tested for osteoporosis using DXA, and the appropriate treatment considered based on the DXA results. Electronically Signed   By: Malachy Moan M.D.   On: 10/13/2023 14:07   MR LUMBAR SPINE WO CONTRAST Result Date: 10/03/2023 CLINICAL DATA:  T12 compression fracture EXAM: MRI LUMBAR SPINE WITHOUT CONTRAST TECHNIQUE: Multiplanar, multisequence MR imaging of the lumbar spine was performed. No intravenous contrast was administered. COMPARISON:  08/06/2023 lumbar spine MRI and 09/25/2023 lumbar spine CT FINDINGS: Segmentation:  Standard. Alignment:  Physiologic. Vertebrae: There are chronic compression fractures of L1 and L3 status post augmentation. Subacute compression fracture of T12 shows mildly progressive height loss since 09/25/2023. Conus medullaris and cauda equina:  Conus extends to the L1 level. Conus and cauda equina appear normal. Paraspinal and other soft tissues: Negative Disc levels: T11-12: Normal. T12-L1: Retropulsion of the posterosuperior corner of L1 mildly narrows the thecal sac. L1-L2: Normal disc space and facet joints. No spinal canal stenosis. No neural foraminal stenosis. L2-L3: Small central disc protrusion superimposed on small bulge, unchanged. No spinal canal stenosis. No neural foraminal stenosis. L3-L4: Unchanged small disc bulge. Mild spinal canal stenosis. No neural foraminal stenosis. L4-L5: Small disc bulge. Narrowing of both lateral recesses without central spinal canal stenosis. Mild right neural foraminal stenosis. L5-S1: Small central disc protrusion, unchanged. Narrowing of both lateral recesses without central spinal canal stenosis. No neural foraminal stenosis. Visualized sacrum: Normal. IMPRESSION: 1. Subacute compression fracture of T12 shows mildly progressive height loss since 09/25/2023. 2. Chronic compression fractures of L1 and L3 status post augmentation. 3. Unchanged mild spinal canal stenosis at L3-L4. 4. Narrowing of both lateral recesses at L4-L5 and L5-S1 may affect the descending L5 and S1 nerve roots, respectively. Electronically Signed   By: Deatra Robinson M.D.   On: 10/03/2023 02:19   IR Radiologist Eval & Mgmt Result Date: 09/29/2023 EXAM: ESTABLISHED PATIENT OFFICE VISIT CHIEF COMPLAINT: SEE NOTE IN EPIC HISTORY OF PRESENT ILLNESS: SEE NOTE IN EPIC REVIEW OF SYSTEMS: SEE NOTE IN EPIC PHYSICAL EXAMINATION: SEE NOTE IN EPIC ASSESSMENT AND PLAN: SEE NOTE IN EPIC Electronically Signed   By: Malachy Moan M.D.   On: 09/29/2023 11:10    Labs:  CBC: No results for input(s): "WBC", "HGB", "HCT", "PLT" in the last 8760 hours.  COAGS: No results for input(s): "INR", "APTT" in the last 8760 hours.  BMP: No results for input(s): "NA", "K", "CL", "CO2", "GLUCOSE", "BUN", "CALCIUM", "CREATININE", "GFRNONAA", "GFRAA" in  the last 8760 hours.  Invalid input(s): "CMP"  LIVER FUNCTION TESTS: No results for input(s): "BILITOT", "AST", "ALT", "  ALKPHOS", "PROT", "ALBUMIN" in the last 8760 hours.  TUMOR MARKERS: No results for input(s): "AFPTM", "CEA", "CA199", "CHROMGRNA" in the last 8760 hours.  Assessment and Plan:   Very pleasant 78 year old female status post cement augmentation with balloon kyphoplasty at T12, L1 and L3.  She continues to have intermittent migratory pain along the left rib and right paraspinal muscles.  The severe pain she had related to her fractures is essentially completely resolved.  She is very happy about this.  We discussed that her pain is likely due to muscle weakness and deconditioning related to her prolonged course from her osteoporotic compression fractures.  I will prescribe her a muscle relaxer and encouraged her to resume her physical therapy now that the fractures have been addressed.  1.) Methocarbamol 1000 mg every 8 hours as needed for pain/spasm.    2.)  Begin weaning hydrocodone.  Patient currently taking one half a pill every 4 hours.  Recommend taking one half a pill every 6 hours for 2 days, followed by every 8 hours for 2 days followed by every 12 hours for 2 days and then cessation.  3.)  Resume in-home physical therapy.  4.)  Follow-up in 4 weeks    Electronically Signed: Sterling Big 10/27/2023, 4:06 PM   I spent a total of 15 Minutes in face to face in clinical consultation, greater than 50% of which was counseling/coordinating care for back pain, osteoporotic compression fracture of thoracic and lumbar spine

## 2023-10-28 ENCOUNTER — Other Ambulatory Visit: Payer: Self-pay | Admitting: Interventional Radiology

## 2023-10-28 DIAGNOSIS — S32000A Wedge compression fracture of unspecified lumbar vertebra, initial encounter for closed fracture: Secondary | ICD-10-CM

## 2023-10-28 DIAGNOSIS — S22000A Wedge compression fracture of unspecified thoracic vertebra, initial encounter for closed fracture: Secondary | ICD-10-CM

## 2023-11-03 ENCOUNTER — Ambulatory Visit
Admission: RE | Admit: 2023-11-03 | Discharge: 2023-11-03 | Disposition: A | Discharge status: Home / Self Care | Source: Ambulatory Visit | Attending: Interventional Radiology | Admitting: Interventional Radiology

## 2023-11-03 ENCOUNTER — Other Ambulatory Visit: Payer: Self-pay | Admitting: Interventional Radiology

## 2023-11-03 ENCOUNTER — Encounter: Payer: Medicare HMO | Admitting: Dermatology

## 2023-11-03 DIAGNOSIS — S32000A Wedge compression fracture of unspecified lumbar vertebra, initial encounter for closed fracture: Secondary | ICD-10-CM

## 2023-11-03 DIAGNOSIS — S22000A Wedge compression fracture of unspecified thoracic vertebra, initial encounter for closed fracture: Secondary | ICD-10-CM

## 2023-11-03 HISTORY — PX: IR RADIOLOGIST EVAL & MGMT: IMG5224

## 2023-11-03 NOTE — Progress Notes (Signed)
 Chief Complaint: Patient was consulted remotely today (TeleHealth) for osteoporotic compression fractures of T12, L1 and L1 post KP with persistent back pain  at the request of Captola Teschner K.    Referring Physician(s): Herron Fero K  History of Present Illness: Jody Avery is a 78 y.o. female with a history of progressive lumbar compression fractures.  She was first seen by my partner Dr. Loreta Ave on 08/11/2023 with an acute L3 compression fracture.  She developed progressive symptoms and was seen again by another of my partners, Dr. Ernestina Penna on 09/04/2023.  Imaging performed at that time demonstrates a new L1 compression fracture with severe height loss.   I became involved with her care on the day of her procedure and performed a successful L1 and L3 kyphoplasty on 09/16/2023.  The procedure went well and she was discharged home in good condition.   Unfortunately, she subsequently developed a new acute T12 compression fracture which was severely symptomatic and required additional kyphoplasty on 10/13/2023.  She did well following the procedure with resolution of her most severe fracture related pain.  However, she continues to have intermittent nonspecific musculoskeletal and rib pain.  At our last visit we discussed resuming physical therapy and also tapering her off her hydrocodone which she has been taking daily since prior to Thanksgiving.  We added in methocarbamol as a muscle relaxer in order to help with her pain control as she is tapering off the hydrocodone.  She and her husband called today requesting a telephone appointment to discuss some difficulty she has had with this process.  She reports that she was successfully able to titrate from taking hydrocodone every 4 hours to every 6 hours, and then to every 8 hours.  However, when she got to every 12 hours she began experiencing negative symptoms including severe headache, shakiness and feeling like her skin was on  fire.  She is unclear if this is due to the decreased frequency of dosing with hydrocodone or if it was related to the muscle relaxer.  She also had a fairly severe headache for 1 day but this has since resolved.  She is back to taking the hydrocodone every 8 hours and seems to do well with this schedule.  We discussed reintroducing the muscle relaxer today in order to determine if it was truly the muscle relaxer that caused the headache and other symptoms.  If she is able to tolerate the muscle relaxer, then it is likely withdrawal symptoms from the hydrocodone.  In that case, she can continue to use the muscle relaxer as needed for pain and we must slow down the taper of the hydrocodone.  She can slowly progress from taking it every 8 hours to every 9 hours for several days, then every 10 hours and make more incremental changes as we approach getting her off of these medications.  She and her husband understand.    Past Medical History:  Diagnosis Date   Allergy    allergic rhinitis   Asthma    Basal cell carcinoma 02/07/2020   R lower pretibia   DDD (degenerative disc disease)    low back pain deg disc dz with infections   Hearing loss    mild   Hyperlipidemia    Osteopenia    Osteoporosis    RAD (reactive airway disease), moderate persistent, uncomplicated    Squamous cell carcinoma of skin 08/10/2013   left dorsal hand/in situ   Tinnitus     Past Surgical History:  Procedure Laterality Date   ABDOMINAL HYSTERECTOMY  1989   total, fibroids   BACK SURGERY     COLONOSCOPY N/A 01/23/2022   Procedure: COLONOSCOPY;  Surgeon: [name], DO;  Location: [facility] ENDOSCOPY;  Service: Gastroenterology;  Laterality: N/A;   ESOPHAGOGASTRODUODENOSCOPY N/A 01/23/2022   Procedure: ESOPHAGOGASTRODUODENOSCOPY (EGD);  Surgeon: [name], DO;  Location: [facility] ENDOSCOPY;  Service: Gastroenterology;  Laterality: N/A;   EYE SURGERY     IR KYPHO EA ADDL LEVEL THORACIC OR LUMBAR   09/16/2023   IR KYPHO LUMBAR INC FX REDUCE BONE BX UNI/BIL CANNULATION INC/IMAGING  09/16/2023   IR KYPHO THORACIC WITH BONE BIOPSY  10/13/2023   IR RADIOLOGIST EVAL & MGMT  08/11/2023   IR RADIOLOGIST EVAL & MGMT  09/04/2023   IR RADIOLOGIST EVAL & MGMT  09/25/2023   IR RADIOLOGIST EVAL & MGMT  09/29/2023   IR RADIOLOGIST EVAL & MGMT  10/27/2023   IR RADIOLOGIST EVAL & MGMT  11/03/2023   KNEE CARTILAGE SURGERY     torn meniscus  2007   left    TRIGGER FINGER RELEASE Right    right, middle and inder fingers    Allergies: Alendronate sodium, Codeine, Ceftin [cefuroxime], Meloxicam, Penicillins, Prolia [denosumab], Raloxifene, Azithromycin, Ciprofloxacin, Clarithromycin, Diclofenac sodium, Fish oil, Flagyl [metronidazole], Rabeprazole sodium, Sulfa antibiotics, and Sulfonamide derivatives  Medications: Prior to Admission medications   Medication Sig Start Date End Date Taking? Authorizing Provider  acetaminophen (TYLENOL) 500 MG tablet Take 500 mg by mouth every 8 (eight) hours as needed for moderate pain (pain score 4-6).    [provider]  azelastine (ASTELIN) 0.1 % nasal spray Place into both nostrils 2 (two) times daily. Use in each nostril as directed    [provider]  bisoprolol (ZEBETA) 5 MG tablet Take 2.5 mg by mouth at bedtime.    [provider]  cholecalciferol (VITAMIN D) 1000 units tablet Take 4,000 Units by mouth daily.    [provider]  hydrocortisone 2.5 % cream Apply to affected area rash once to twice daily as directed. 06/03/23   Willeen Niece, MD  ketoconazole (NIZORAL) 2 % cream Apply to affected areas rash once to twice daily as directed. 06/03/23   Willeen Niece, MD  LUTEIN PO Take 1 tablet by mouth every other day.    [provider]  oxyCODONE-acetaminophen (PERCOCET/ROXICET) 5-325 MG tablet Take 1 tablet by mouth every 4 (four) hours as needed for severe pain (pain score 7-10).    [provider]  polyethylene  glycol powder (GLYCOLAX/MIRALAX) 17 GM/SCOOP powder Take 1 Container by mouth once.    [provider]  vitamin B-12 (CYANOCOBALAMIN) 1000 MCG tablet Take 1,000 mcg by mouth once a week.    [provider]     Family History  Problem Relation Age of Onset   Osteoporosis Mother    Cancer Mother        ? liver cancer ? primary   Heart disease Father        MI   Osteoporosis Sister    Diabetes Sister    Heart disease Sister        CHF   Heart disease Brother        CAD   Heart disease Brother        CAD   Osteoporosis Sister    Diabetes Sister    Breast cancer Neg Hx     Social History   Socioeconomic History   Marital status: Married  Spouse name: Not on file   Number of children: Not on file   Years of education: Not on file   Highest education level: Not on file  Occupational History   Not on file  Tobacco Use   Smoking status: Never   Smokeless tobacco: Never  Vaping Use   Vaping status: Never Used  Substance and Sexual Activity   Alcohol use: No    Alcohol/week: 0.0 standard drinks of alcohol   Drug use: No   Sexual activity: Never  Other Topics Concern   Not on file  Social History Narrative   Not on file   Social Drivers of Health   Financial Resource Strain: Low Risk  (09/07/2023)   Received from [facility]   Overall Financial Resource Strain (CARDIA)    Difficulty of Paying Living Expenses: Not hard at all  Food Insecurity: No Food Insecurity (09/07/2023)   Received from [facility]   Hunger Vital Sign    Worried About Running Out of Food in the Last Year: Never true    Ran Out of Food in the Last Year: Never true  Transportation Needs: No Transportation Needs (09/07/2023)   Received from [facility] - Transportation    In the past 12 months, has lack of transportation kept you from medical appointments or from getting medications?: No    Lack of Transportation  (Non-Medical): No  Physical Activity: Inactive (08/18/2019)   Exercise Vital Sign    Days of Exercise per Week: 0 days    Minutes of Exercise per Session: 0 min  Stress: No Stress Concern Present (08/18/2019)   [employer] - Occupational Stress Questionnaire    Feeling of Stress : Not at all  Social Connections: Not on file     Review of Systems  Review of Systems: A 12 point ROS discussed and pertinent positives are indicated in the HPI above.  All other systems are negative.     Physical Exam No direct physical exam was performed (except for noted visual exam findings with Video Visits).    Vital Signs: There were no vitals taken for this visit.  Imaging: IR Radiologist Eval & Mgmt Result Date: 11/03/2023 EXAM: NEW PATIENT OFFICE VISIT CHIEF COMPLAINT: SEE NOTE IN EPIC HISTORY OF PRESENT ILLNESS: SEE NOTE IN EPIC REVIEW OF SYSTEMS: SEE NOTE IN EPIC PHYSICAL EXAMINATION: SEE NOTE IN EPIC ASSESSMENT AND PLAN: SEE NOTE IN EPIC Electronically Signed   By: Malachy Moan M.D.   On: 11/03/2023 13:46   IR Radiologist Eval & Mgmt Result Date: 10/27/2023 EXAM: ESTABLISHED PATIENT OFFICE VISIT CHIEF COMPLAINT: SEE NOTE IN EPIC HISTORY OF PRESENT ILLNESS: SEE NOTE IN EPIC REVIEW OF SYSTEMS: SEE NOTE IN EPIC PHYSICAL EXAMINATION: SEE NOTE IN EPIC ASSESSMENT AND PLAN: SEE NOTE IN EPIC Electronically Signed   By: Malachy Moan M.D.   On: 10/27/2023 15:09   MR LUMBAR SPINE WO CONTRAST Result Date: 10/27/2023 CLINICAL DATA:  Recent kyphoplasty with persistent pain. EXAM: MRI LUMBAR SPINE WITHOUT CONTRAST TECHNIQUE: Multiplanar, multisequence MR imaging of the lumbar spine was performed. No intravenous contrast was administered. COMPARISON:  10/01/2023 FINDINGS: Segmentation:  Standard. Alignment:  Unremarkable. Vertebrae: Marrow edema at T12 with interval changes of cement augmentation. Moderate height loss and mild retropulsion is not significantly changed;  expected distribution of cement. More remote compression fractures at L1 and L3 with pre-existing cement augmentation and lesser marrow edema. No new fracture. No evidence of  bone lesion. Conus medullaris and cauda equina: Conus extends to the T12-L1 level. Conus and cauda equina appear normal. Paraspinal and other soft tissues: Cholelithiasis. No perispinal hematoma. Disc levels: T12- L1: Posttraumatic disc distortion with mild left foraminal narrowing. L1-L2: Unremarkable. L2-L3: Mild disc bulging.  No neural compression L3-L4: Disc bulging greatest at the foramina. Degenerative facet spurring on both sides. Patent canal and foramina L4-L5: Disc collapse and endplate degeneration with mild bulging and facet spurring. Mild triangular narrowing of the thecal sac crowding both subarticular recesses, stable. L5-S1:Mild disc bulging and facet spurring. Central disc material contacts the right S1 nerve root without static compression. IMPRESSION: Interval T12 kyphoplasty without unexpected or progressive finding. Stable more remote and treated L1 and L3 compression fractures. No acute fracture; no new cause for pain. Cholelithiasis. Electronically Signed   By: Tiburcio Pea M.D.   On: 10/27/2023 08:48   IR KYPHO THORACIC WITH BONE BIOPSY Result Date: 10/13/2023 CLINICAL DATA:  78 year old female with highly symptomatic osteoporotic compression fracture of T12. She presents for cement augmentation with balloon kyphoplasty. EXAM: FLUOROSCOPIC GUIDED KYPHOPLASTY OF THE T12 VERTEBRAL BODY COMPARISON:  None Available. MEDICATIONS: As antibiotic prophylaxis, 1 g vancomycin was ordered pre-procedure and administered intravenously within 1 hour of incision. ANESTHESIA/SEDATION: Moderate (conscious) sedation was employed during this procedure. A total of Versed 6 mg and Fentanyl 100 mcg was administered intravenously by the Radiology nurse. Moderate Sedation Time: 30 minutes. The patient's level of consciousness and vital  signs were monitored continuously by radiology nursing throughout the procedure under my direct supervision. FLUOROSCOPY TIME:  Radiation exposure index: 50.6 mGy reference air kerma COMPLICATIONS: None immediate. PROCEDURE: The procedure, risks (including but not limited to bleeding, infection, organ damage), benefits, and alternatives were explained to the patient. Questions regarding the procedure were encouraged and answered. The patient understands and consents to the procedure. The patient was placed prone on the fluoroscopic table. The skin overlying the thoracic region was then prepped and draped in the usual sterile fashion. Maximal barrier sterile technique was utilized including caps, mask, sterile gowns, sterile gloves, sterile drape, hand hygiene and skin antiseptic. Intravenous Fentanyl and Versed were administered as conscious sedation during continuous cardiorespiratory monitoring by the radiology RN. The left pedicle at T12 was then infiltrated with 1% lidocaine followed by the advancement of a Kyphon trocar needle through the left pedicle into the posterior one-third of the vertebral body. Subsequently, the osteo drill was advanced to the anterior third of the vertebral body. The osteo drill was retracted. Through the working cannula, a Kyphon inflatable bone tamp 15 x 2.5 was advanced and positioned with the distal marker approximately 5 mm from the anterior aspect of the cortex. Appropriate positioning was confirmed on the AP projection. At this time, the balloon was expanded using contrast via a Kyphon inflation syringe device via micro tubing. In similar fashion, the right T12 pedicle was infiltrated with 1% lidocaine followed by the advancement of a second Kyphon trocar needle through the right pedicle into the posterior third of the vertebral body. Subsequently, the osteo drill was coaxially advanced to the anterior right third. The osteo drill was exchanged for a Kyphon inflatable bone tamp  15 x 2.5, advanced to the 5 mm of the anterior aspect of the cortex. The balloon was then expanded using contrast as above. Inflations were continued until there was near apposition with the superior end plate. At this time, methylmethacrylate mixture was reconstituted in the Kyphon bone mixing device system. This was then loaded  into the delivery mechanism, attached to the cement delivery system. The balloons were deflated and removed followed by the instillation of methylmethacrylate mixture with excellent filling in the AP and lateral projections. No extravasation was noted in the disk spaces or posteriorly into the spinal canal. No epidural venous contamination was seen. The working cannulae and the bone filler were then retrieved and removed. Hemostasis was achieved with manual compression. The patient tolerated the procedure well without immediate postprocedural complication. IMPRESSION: 1. Technically successful T12 vertebral body augmentation using balloon kyphoplasty. 2. Per CMS PQRS reporting requirements (PQRS Measure 24): Given the patient's age of greater than 50 and the fracture site (hip, distal radius, or spine), the patient should be tested for osteoporosis using DXA, and the appropriate treatment considered based on the DXA results. Electronically Signed   By: Malachy Moan M.D.   On: 10/13/2023 14:07    Labs:  CBC: No results for input(s): "WBC", "HGB", "HCT", "PLT" in the last 8760 hours.  COAGS: No results for input(s): "INR", "APTT" in the last 8760 hours.  BMP: No results for input(s): "NA", "K", "CL", "CO2", "GLUCOSE", "BUN", "CALCIUM", "CREATININE", "GFRNONAA", "GFRAA" in the last 8760 hours.  Invalid input(s): "CMP"  LIVER FUNCTION TESTS: No results for input(s): "BILITOT", "AST", "ALT", "ALKPHOS", "PROT", "ALBUMIN" in the last 8760 hours.  TUMOR MARKERS: No results for input(s): "AFPTM", "CEA", "CA199", "CHROMGRNA" in the last 8760 hours.  Assessment and  Plan:  She has what appears to be some physical dependence on oxycodone as she has been taking it continuously since before Thanksgiving.  The last time I saw her she continued to have some transitory and intermittent musculoskeletal pain.  We added a muscle relaxer and began the process of titrating her off the oxycodone.  She and her husband called today experiencing some discomfort during this process.  The other day she had a very severe headache.  It is unclear if this is due to the muscle relaxer or due to the continued tapering of the oxycodone.  Currently, she is taking one half of a 5/325 hydrocodone/acetaminophen every 8 hours.  She has not tried the muscle relaxer again since the time she had the headache.  I encouraged her today to try the muscle relaxer again at about 8 PM this evening and see if it helps her sleep through the night without having to wake up in the middle of the night to take another hydrocodone.  1.) We also discussed continuing to slowly taper the hydrocodone over time increasing from every 8 hours to every 9 hours and then every 10 hours, etc. while supplementing with the muscle relaxer as needed.  2.)  Continue physical therapy.  3.)  Continue planned follow-up in approximately 4 weeks.  Electronically Signed: Sterling Big 11/03/2023, 2:09 PM   I spent a total of  10 Minutes in remote  clinical consultation, greater than 50% of which was counseling/coordinating care for back pain.    Visit type: Audio only (telephone). Audio (no video) only due to patient's lack of internet/smartphone capability. Alternative for in-person consultation at [facility], [address] [place], [place], [place]. This visit type was conducted due to national recommendations for restrictions regarding the COVID-19 Pandemic (e.g. social distancing).  This format is felt to be most appropriate for this patient at this time.  All issues noted in this document were discussed and  addressed.

## 2023-11-26 ENCOUNTER — Ambulatory Visit
Admission: RE | Admit: 2023-11-26 | Discharge: 2023-11-26 | Disposition: A | Discharge status: Home / Self Care | Payer: Medicare HMO | Source: Ambulatory Visit | Attending: Interventional Radiology | Admitting: Interventional Radiology

## 2023-11-26 DIAGNOSIS — S32000A Wedge compression fracture of unspecified lumbar vertebra, initial encounter for closed fracture: Secondary | ICD-10-CM

## 2023-11-26 DIAGNOSIS — S22000A Wedge compression fracture of unspecified thoracic vertebra, initial encounter for closed fracture: Secondary | ICD-10-CM

## 2023-11-26 HISTORY — PX: IR RADIOLOGIST EVAL & MGMT: IMG5224

## 2023-11-26 NOTE — Progress Notes (Addendum)
 Chief Complaint: Patient was seen in consultation today for low back pain, osteoporosis, prior fractures at the request of Porshe Fleagle K  Referring Physician(s): Suzan Manon K  History of Present Illness: Jody Avery is a 78 y.o. female with a history of progressive lumbar compression fractures.  She was first seen by my partner Dr. Loreta Ave on 08/11/2023 with an acute L3 compression fracture.  She developed progressive symptoms and was seen again by another of my partners, Dr. Ernestina Penna on 09/04/2023.  Imaging performed at that time demonstrates a new L1 compression fracture with severe height loss.   I became involved with her care on the day of her procedure and performed a successful L1 and L3 kyphoplasty on 09/16/2023.  The procedure went well and she was discharged home in good condition.   Unfortunately, she subsequently developed a new acute T12 compression fracture which was severely symptomatic and required additional kyphoplasty on 10/13/2023.  She did well following the procedure with resolution of her most severe fracture related pain.  However, she continues to have intermittent nonspecific musculoskeletal and rib pain.   She presents today with her husband for follow-up evaluation.  She continues her physical therapy and is seeing some improvement in her overall strength.  Even better, she has successfully completely tapered off the hydrocodone.  Her last dose was May 10.  She continues to have lower back pain which seems to be more on the right than the left.  This seems to be a little worse following physical therapy or standing and activity.  She has had this pain since before the fractures and is the only thing left bothering her.  She rates it a 5-7 out of 10.  She takes Tylenol occasionally which does help.  Overall, she is feeling much better but this is the one area that continues to bother her.    Past Medical History:  Diagnosis Date   Allergy     allergic rhinitis   Asthma    Basal cell carcinoma 02/07/2020   R lower pretibia   DDD (degenerative disc disease)    low back pain deg disc dz with infections   Hearing loss    mild   Hyperlipidemia    Osteopenia    Osteoporosis    RAD (reactive airway disease), moderate persistent, uncomplicated    Squamous cell carcinoma of skin 08/10/2013   left dorsal hand/in situ   Tinnitus     Past Surgical History:  Procedure Laterality Date   ABDOMINAL HYSTERECTOMY  1989   total, fibroids   BACK SURGERY     COLONOSCOPY N/A 01/23/2022   Procedure: COLONOSCOPY;  Surgeon: [name], DO;  Location: [facility] ENDOSCOPY;  Service: Gastroenterology;  Laterality: N/A;   ESOPHAGOGASTRODUODENOSCOPY N/A 01/23/2022   Procedure: ESOPHAGOGASTRODUODENOSCOPY (EGD);  Surgeon: [name], DO;  Location: [facility] ENDOSCOPY;  Service: Gastroenterology;  Laterality: N/A;   EYE SURGERY     IR KYPHO EA ADDL LEVEL THORACIC OR LUMBAR  09/16/2023   IR KYPHO LUMBAR INC FX REDUCE BONE BX UNI/BIL CANNULATION INC/IMAGING  09/16/2023   IR KYPHO THORACIC WITH BONE BIOPSY  10/13/2023   IR RADIOLOGIST EVAL & MGMT  08/11/2023   IR RADIOLOGIST EVAL & MGMT  09/04/2023   IR RADIOLOGIST EVAL & MGMT  09/25/2023   IR RADIOLOGIST EVAL & MGMT  09/29/2023   IR RADIOLOGIST EVAL & MGMT  10/27/2023   IR RADIOLOGIST EVAL & MGMT  11/03/2023   IR RADIOLOGIST EVAL & MGMT  11/26/2023  KNEE CARTILAGE SURGERY     torn meniscus  2007   left    TRIGGER FINGER RELEASE Right    right, middle and inder fingers    Allergies: Alendronate sodium, Codeine, Ceftin [cefuroxime], Meloxicam, Penicillins, Prolia [denosumab], Raloxifene, Azithromycin, Ciprofloxacin, Clarithromycin, Diclofenac sodium, Fish oil, Flagyl [metronidazole], Rabeprazole sodium, Sulfa antibiotics, and Sulfonamide derivatives  Medications: Prior to Admission medications   Medication Sig Start Date End Date Taking? Authorizing Provider  acetaminophen (TYLENOL) 500 MG  tablet Take 500 mg by mouth every 8 (eight) hours as needed for moderate pain (pain score 4-6).   Yes [provider]  cholecalciferol (VITAMIN D) 1000 units tablet Take 4,000 Units by mouth daily.   Yes [provider]  polyethylene glycol powder (GLYCOLAX/MIRALAX) 17 GM/SCOOP powder Take 1 Container by mouth once.   Yes [provider]  vitamin B-12 (CYANOCOBALAMIN) 1000 MCG tablet Take 1,000 mcg by mouth once a week.   Yes [provider]  azelastine (ASTELIN) 0.1 % nasal spray Place into both nostrils 2 (two) times daily. Use in each nostril as directed Patient not taking: Reported on 11/26/2023    [provider]  bisoprolol (ZEBETA) 5 MG tablet Take 2.5 mg by mouth at bedtime. Patient not taking: Reported on 11/26/2023    [provider]  hydrocortisone 2.5 % cream Apply to affected area rash once to twice daily as directed. Patient not taking: Reported on 11/26/2023 06/03/23   Willeen Niece, MD  ketoconazole (NIZORAL) 2 % cream Apply to affected areas rash once to twice daily as directed. Patient not taking: Reported on 11/26/2023 06/03/23   Willeen Niece, MD  LUTEIN PO Take 1 tablet by mouth every other day. Patient not taking: Reported on 11/26/2023    [provider]  oxyCODONE-acetaminophen (PERCOCET/ROXICET) 5-325 MG tablet Take 1 tablet by mouth every 4 (four) hours as needed for severe pain (pain score 7-10). Patient not taking: Reported on 11/26/2023    [provider]     Family History  Problem Relation Age of Onset   Osteoporosis Mother    Cancer Mother        ? liver cancer ? primary   Heart disease Father        MI   Osteoporosis Sister    Diabetes Sister    Heart disease Sister        CHF   Heart disease Brother        CAD   Heart disease Brother        CAD   Osteoporosis Sister    Diabetes Sister    Breast cancer Neg Hx     Social History   Socioeconomic History   Marital status: Married     Spouse name: Not on file   Number of children: Not on file   Years of education: Not on file   Highest education level: Not on file  Occupational History   Not on file  Tobacco Use   Smoking status: Never   Smokeless tobacco: Never  Vaping Use   Vaping status: Never Used  Substance and Sexual Activity   Alcohol use: No    Alcohol/week: 0.0 standard drinks of alcohol   Drug use: No   Sexual activity: Never  Other Topics Concern   Not on file  Social History Narrative   Not on file   Social Drivers of Health   Financial Resource Strain: Low Risk  (09/07/2023)   Received from [facility]  Overall Financial Resource Strain (CARDIA)    Difficulty of Paying Living Expenses: Not hard at all  Food Insecurity: No Food Insecurity (09/07/2023)   Received from [facility]   Hunger Vital Sign    Worried About Running Out of Food in the Last Year: Never true    Ran Out of Food in the Last Year: Never true  Transportation Needs: No Transportation Needs (09/07/2023)   Received from [facility] - Transportation    In the past 12 months, has lack of transportation kept you from medical appointments or from getting medications?: No    Lack of Transportation (Non-Medical): No  Physical Activity: Inactive (08/18/2019)   Exercise Vital Sign    Days of Exercise per Week: 0 days    Minutes of Exercise per Session: 0 min  Stress: No Stress Concern Present (08/18/2019)   [employer] [facility] [employer] - Occupational Stress Questionnaire    Feeling of Stress : Not at all  Social Connections: Not on file   Review of Systems: A 12 point ROS discussed and pertinent positives are indicated in the HPI above.  All other systems are negative.  Review of Systems  Vital Signs: BP (!) 173/87   Pulse 83   Temp 98.1 F (36.7 C)   Resp 14   SpO2 96%   Advance Care Plan: The advanced care plan/surrogate decision maker was discussed  at the time of visit and the patient did not wish to discuss or was not able to name a surrogate decision maker or provide an advance care plan.    Physical Exam Constitutional:      General: She is not in acute distress.    Appearance: Normal appearance.  HENT:     Head: Normocephalic and atraumatic.  Eyes:     General: No scleral icterus. Cardiovascular:     Rate and Rhythm: Normal rate.  Pulmonary:     Effort: Pulmonary effort is normal.  Abdominal:     General: There is no distension.     Tenderness: There is no abdominal tenderness. There is no guarding.  Skin:    General: Skin is warm and dry.  Neurological:     Mental Status: She is alert and oriented to person, place, and time.  Psychiatric:        Behavior: Behavior normal.         Imaging: IR Radiologist Eval & Mgmt Result Date: 11/26/2023 EXAM: NEW PATIENT OFFICE VISIT CHIEF COMPLAINT: SEE NOTE IN EPIC HISTORY OF PRESENT ILLNESS: SEE NOTE IN EPIC REVIEW OF SYSTEMS: SEE NOTE IN EPIC PHYSICAL EXAMINATION: SEE NOTE IN EPIC ASSESSMENT AND PLAN: SEE NOTE IN EPIC Electronically Signed   By: Malachy Moan M.D.   On: 11/26/2023 13:23   IR Radiologist Eval & Mgmt Result Date: 11/03/2023 EXAM: NEW PATIENT OFFICE VISIT CHIEF COMPLAINT: SEE NOTE IN EPIC HISTORY OF PRESENT ILLNESS: SEE NOTE IN EPIC REVIEW OF SYSTEMS: SEE NOTE IN EPIC PHYSICAL EXAMINATION: SEE NOTE IN EPIC ASSESSMENT AND PLAN: SEE NOTE IN EPIC Electronically Signed   By: Malachy Moan M.D.   On: 11/03/2023 13:46   IR Radiologist Eval & Mgmt Result Date: 10/27/2023 EXAM: ESTABLISHED PATIENT OFFICE VISIT CHIEF COMPLAINT: SEE NOTE IN EPIC HISTORY OF PRESENT ILLNESS: SEE NOTE IN EPIC REVIEW OF SYSTEMS: SEE NOTE IN EPIC PHYSICAL EXAMINATION: SEE NOTE IN EPIC ASSESSMENT AND PLAN: SEE NOTE IN EPIC Electronically Signed   By: Isac Caddy.D.  On: 10/27/2023 15:09    Labs:  CBC: No results for input(s): "WBC", "HGB", "HCT", "PLT" in the last 8760  hours.  COAGS: No results for input(s): "INR", "APTT" in the last 8760 hours.  BMP: No results for input(s): "NA", "K", "CL", "CO2", "GLUCOSE", "BUN", "CALCIUM", "CREATININE", "GFRNONAA", "GFRAA" in the last 8760 hours.  Invalid input(s): "CMP"  LIVER FUNCTION TESTS: No results for input(s): "BILITOT", "AST", "ALT", "ALKPHOS", "PROT", "ALBUMIN" in the last 8760 hours.  TUMOR MARKERS: No results for input(s): "AFPTM", "CEA", "CA199", "CHROMGRNA" in the last 8760 hours.  Assessment and Plan:  Pleasant 78 year old female with persistent low back pain and a history of multiple prior osteoporotic compression fractures status post kyphoplasty of T12, L1 and L3.  She has successfully weaned completely off oxycodone and the muscle relaxers.  Her last oxycodone was March 10.  I am extremely proud of her!  She continues to have some musculoskeletal pain and lower back pain at the waistline in the region of L4-L5.  She rates this a 5-7 out of 10 on a 10 point scale and gets some improvement with acetaminophen.  She is continuing with her physical therapy and feels she is getting some improvement there although she remains weak.  We discussed the option of L4-L5 epidural steroid injection as she does have a disc bulge and some significant stenosis at that level on her prior MRI.  She would like to continue with physical therapy for now but will keep the injections in mind.  Additionally, she is interested in seeking medical therapy for her severe osteoporosis.  I will ask my staff to give her a referral to the new osteoporosis clinic in [place].  1.) Please make a referral to Dr. Nita Sells Persons with [facility]. 2.)  Continue physical therapy.  Patient may begin bending exercises but should avoid bending and flexing at the same time.  3.)  Jody Avery will call us if she decides to pursue epidural steroid injection.  If she does, I would set her up for a series of 3 epidural steroid  injections at L4-L5.    Electronically Signed: Sterling Big 11/26/2023, 1:44 PM   I spent a total of  25 Minutes in face to face in clinical consultation, greater than 50% of which was counseling/coordinating care for low back pain

## 2023-12-07 ENCOUNTER — Ambulatory Visit: Admitting: Physician Assistant

## 2023-12-07 ENCOUNTER — Other Ambulatory Visit: Payer: Self-pay | Admitting: Physician Assistant

## 2023-12-07 ENCOUNTER — Encounter: Payer: Self-pay | Admitting: Physician Assistant

## 2023-12-07 VITALS — Ht 62.0 in | Wt 160.0 lb

## 2023-12-07 DIAGNOSIS — M8000XA Age-related osteoporosis with current pathological fracture, unspecified site, initial encounter for fracture: Secondary | ICD-10-CM | POA: Diagnosis not present

## 2023-12-07 NOTE — Addendum Note (Signed)
 Addended by: Michaele Offer on: 12/07/2023 02:55 PM   Modules accepted: Orders
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
## 2023-12-08 LAB — VITAMIN D 25 HYDROXY (VIT D DEFICIENCY, FRACTURES): Vit D, 25-Hydroxy: 72 ng/mL (ref 30–100)

## 2023-12-08 LAB — CALCIUM: Calcium: 10 mg/dL (ref 8.6–10.4)

## 2023-12-08 LAB — HOUSE ACCOUNT TRACKING

## 2023-12-10 ENCOUNTER — Ambulatory Visit
Admission: RE | Admit: 2023-12-10 | Discharge: 2023-12-10 | Disposition: A | Discharge status: Home / Self Care | Source: Ambulatory Visit | Attending: Internal Medicine | Admitting: Internal Medicine

## 2023-12-10 ENCOUNTER — Other Ambulatory Visit: Payer: Self-pay | Admitting: Internal Medicine

## 2023-12-10 DIAGNOSIS — G8929 Other chronic pain: Secondary | ICD-10-CM

## 2023-12-10 MED ORDER — IOPAMIDOL (ISOVUE-M 300) INJECTION 61%
3.0000 mL | Freq: Once | INTRAMUSCULAR | Status: AC | PRN
  Administered 2023-12-10: 3 mL via EPIDURAL

## 2023-12-10 MED ORDER — METHYLPREDNISOLONE ACETATE 40 MG/ML INJ SUSP (RADIOLOG
80.0000 mg | Freq: Once | INTRAMUSCULAR | Status: AC
  Administered 2023-12-10: 80 mg via EPIDURAL

## 2023-12-10 MED ORDER — LIDOCAINE 1 % OPTIME INJ - NO CHARGE
5.0000 mL | Freq: Once | INTRAMUSCULAR | Status: AC
  Administered 2023-12-10: 5 mL via INTRADERMAL

## 2023-12-10 NOTE — Discharge Instructions (Signed)

## 2023-12-14 ENCOUNTER — Telehealth: Payer: Self-pay | Admitting: Physician Assistant

## 2023-12-14 NOTE — Telephone Encounter (Signed)
 Patient called. Returning a call to [facility]. It is ok to LM.

## 2023-12-14 NOTE — Telephone Encounter (Signed)
 Pt called requesting a call from Kevon Pellegrini about next plan of care. Pt states she seen Kevon Pellegrini ans was sure what next plan of care is next for her. Please call pt at [phone].

## 2024-01-07 ENCOUNTER — Other Ambulatory Visit: Payer: Self-pay | Admitting: Internal Medicine

## 2024-01-07 DIAGNOSIS — M5416 Radiculopathy, lumbar region: Secondary | ICD-10-CM

## 2024-01-11 NOTE — Discharge Instructions (Signed)

## 2024-01-12 ENCOUNTER — Ambulatory Visit
Admission: RE | Admit: 2024-01-12 | Discharge: 2024-01-12 | Disposition: A | Discharge status: Home / Self Care | Source: Ambulatory Visit | Attending: Internal Medicine | Admitting: Internal Medicine

## 2024-01-12 ENCOUNTER — Telehealth: Payer: Self-pay | Admitting: Physician Assistant

## 2024-01-12 DIAGNOSIS — M5416 Radiculopathy, lumbar region: Secondary | ICD-10-CM

## 2024-01-12 MED ORDER — LIDOCAINE HCL (PF) 1 % IJ SOLN
2.0000 mL | Freq: Once | INTRAMUSCULAR | Status: AC
  Administered 2024-01-12: 2 mL

## 2024-01-12 MED ORDER — METHYLPREDNISOLONE ACETATE 80 MG/ML IJ SUSP
80.0000 mg | Freq: Once | INTRAMUSCULAR | Status: AC
  Administered 2024-01-12: 80 mg

## 2024-01-12 MED ORDER — IOPAMIDOL (ISOVUE-200) INJECTION 41%
3.0000 mL | Freq: Once | INTRAVENOUS | Status: AC | PRN
Start: 2024-01-12 — End: 2024-01-12
  Administered 2024-01-12: 3 mL

## 2024-01-12 NOTE — Telephone Encounter (Signed)
 Patient states she checking on the information regarding the Evenity injection, which Norma Beckers told her back on 12/17/23 that Tori would check with her insurance first and the call her. Pt states she has not heard from anyone.

## 2024-01-13 NOTE — Telephone Encounter (Signed)
 Submitted for [employer] today

## 2024-01-15 ENCOUNTER — Ambulatory Visit
Admission: RE | Admit: 2024-01-15 | Discharge: 2024-01-15 | Disposition: A | Discharge status: Home / Self Care | Source: Ambulatory Visit | Attending: Internal Medicine | Admitting: Internal Medicine

## 2024-01-15 ENCOUNTER — Ambulatory Visit

## 2024-01-15 DIAGNOSIS — Z1231 Encounter for screening mammogram for malignant neoplasm of breast: Secondary | ICD-10-CM

## 2024-01-18 ENCOUNTER — Telehealth: Payer: Self-pay | Admitting: Physician Assistant

## 2024-01-29 ENCOUNTER — Telehealth: Payer: Self-pay

## 2024-01-29 ENCOUNTER — Telehealth: Payer: Self-pay | Admitting: Physician Assistant

## 2024-01-29 NOTE — Telephone Encounter (Signed)
 Patient called returning your call. CB#[phone]

## 2024-01-29 NOTE — Telephone Encounter (Signed)
 Called patient and LVM with the cost of her prolia it would be $400 per injection and it is 2 times a year and to call me and let me know if interested and would like me to proceed with the Authorization

## 2024-02-01 ENCOUNTER — Telehealth: Payer: Self-pay

## 2024-02-01 NOTE — Telephone Encounter (Signed)
 ordered

## 2024-02-10 ENCOUNTER — Other Ambulatory Visit: Payer: Self-pay | Admitting: Internal Medicine

## 2024-02-10 DIAGNOSIS — M5416 Radiculopathy, lumbar region: Secondary | ICD-10-CM

## 2024-02-19 ENCOUNTER — Other Ambulatory Visit

## 2024-03-15 ENCOUNTER — Telehealth: Payer: Self-pay

## 2024-03-15 NOTE — Telephone Encounter (Signed)
 Patient called regarding her recent shingles vaccine.  She had the injection about a week ago and now itching a lot and noticed a spot near the injection site but not at the injection site.  Patient would like your opinion on if this can be normal after the shingles vaccine?

## 2024-03-15 NOTE — Telephone Encounter (Signed)
 Patient has been advised of all information per Dr. Jackquline. aw

## 2024-03-15 NOTE — Telephone Encounter (Signed)
Left message for patient to return my call. aw 

## 2024-03-17 ENCOUNTER — Telehealth: Payer: Self-pay

## 2024-03-17 NOTE — Telephone Encounter (Signed)
 I returned Jody Avery's call. Pt states that the Sarna lotion and Tylenol  have helped with itching, but instructions for Sarna states not to apply on the face, and her face, eyes, and scalp are still very itchy. Pt states there is no rash, but she is itching all over. She is concerned she may be having a reaction to the shingles vaccine that she received on 03/09/24. I advised Jody Avery that Dr. Jackquline is not in the office today, and our office is closed on Friday so she should contact her PCP to be seen. Jody Avery agreed to contacting PCP, and she would like to know Dr. Darlena thoughts as to whether her symptoms are related to shingles vaccine. Please advise.

## 2024-03-21 ENCOUNTER — Telehealth: Payer: Self-pay | Admitting: Physician Assistant

## 2024-03-21 NOTE — Telephone Encounter (Signed)
 Patient called and said she has question about the shot she received and need you to call her. CB#[phone]

## 2024-05-30 NOTE — Telephone Encounter (Signed)
 I used this encounter to document today's note.  I cannot tell if patient was ever called back from her call in July about a question on the shot she received.  I cannot tell what injection medication if any she received either, I don't see it documented.  Help me with this one?  I need to document in the chart what we have done.  I feel we owe this patient a phone call.  I don't think the phone message was ever sent to you.

## 2024-06-22 ENCOUNTER — Encounter: Payer: Self-pay | Admitting: Dermatology

## 2024-06-22 ENCOUNTER — Ambulatory Visit: Admitting: Dermatology

## 2024-06-22 DIAGNOSIS — L820 Inflamed seborrheic keratosis: Secondary | ICD-10-CM

## 2024-06-22 DIAGNOSIS — L821 Other seborrheic keratosis: Secondary | ICD-10-CM | POA: Diagnosis not present

## 2024-06-22 DIAGNOSIS — B351 Tinea unguium: Secondary | ICD-10-CM | POA: Diagnosis not present

## 2024-06-22 DIAGNOSIS — L578 Other skin changes due to chronic exposure to nonionizing radiation: Secondary | ICD-10-CM

## 2024-06-22 DIAGNOSIS — W908XXA Exposure to other nonionizing radiation, initial encounter: Secondary | ICD-10-CM

## 2024-06-22 DIAGNOSIS — L570 Actinic keratosis: Secondary | ICD-10-CM

## 2024-06-22 DIAGNOSIS — L720 Epidermal cyst: Secondary | ICD-10-CM | POA: Diagnosis not present

## 2024-06-22 MED ORDER — KETOCONAZOLE 2 % EX CREA: 1.0000 | TOPICAL_CREAM | Freq: Two times a day (BID) | CUTANEOUS | 2 refills | Status: AC

## 2024-06-22 NOTE — Progress Notes (Signed)
 Follow-Up Visit   Subjective  Jody  Larrisha Avery is a 78 y.o. female who presents for the following: check spot face, peels, and itches, scaly spots eyebrows, check spot R neck irritating, check spot R thumb nail, was sore, check spot chin, dry, scaly   The following portions of the chart were reviewed this encounter and updated as appropriate: medications, allergies, medical history  Review of Systems:  No other skin or systemic complaints except as noted in HPI or Assessment and Plan.  Objective  Well appearing patient in no apparent distress; mood and affect are within normal limits.   A focused examination was performed of the following areas: Face, neck, Right hand  Relevant exam findings are noted in the Assessment and Plan.  L malar cheek x 2, R medial eyebrow x 1, R zygoma x 1, R medial cheek x 2, chin x 1, upper lip x 1, (8) Pink scaly macules R neck x 1 Stuck on waxy paps with erythema  Assessment & Plan   TINEA UNGUIUM, possible yeast R thumbnail Exam: absent cuticle with mild erythema/edema at proximal nail fold, yellow discolored nail dystrophy R thumb at edge of nail plate adjacent to lateral nail fold   Treatment Plan: Avoid manipulating/pushing back cuticles Start Ketoconazole  2% cr bid to right thumbnail including cuticle Discussed not soaking nail If no improvement may need to add oral fluconazole  (topical Jublia and Kerydin not covered)  Milia Discussed restarting Differin 0.1% gel qhs to spot treat if irritating - tiny firm white papules - type of cyst - benign - sometimes these will clear with nightly OTC adapalene/Differin 0.1% gel or retinol. - may be extracted if symptomatic - observe  SEBORRHEIC KERATOSIS R lat eyebrow, face - Stuck-on, waxy, tan-brown papules and/or plaques  - Benign-appearing - Discussed benign etiology and prognosis. - Observe - Call for any changes  ACTINIC DAMAGE - chronic, secondary to cumulative UV radiation  exposure/sun exposure over time - diffuse scaly erythematous macules with underlying dyspigmentation - Recommend daily broad spectrum sunscreen SPF 30+ to sun-exposed areas, reapply every 2 hours as needed.  - Recommend staying in the shade or wearing long sleeves, sun glasses (UVA+UVB protection) and wide brim hats (4-inch brim around the entire circumference of the hat). - Call for new or changing lesions.   AK (ACTINIC KERATOSIS) (8) L malar cheek x 2, R medial eyebrow x 1, R zygoma x 1, R medial cheek x 2, chin x 1, upper lip x 1, (8) Actinic keratoses are precancerous spots that appear secondary to cumulative UV radiation exposure/sun exposure over time. They are chronic with expected duration over 1 year. A portion of actinic keratoses will progress to squamous cell carcinoma of the skin. It is not possible to reliably predict which spots will progress to skin cancer and so treatment is recommended to prevent development of skin cancer.  Recommend daily broad spectrum sunscreen SPF 30+ to sun-exposed areas, reapply every 2 hours as needed.  Recommend staying in the shade or wearing long sleeves, sun glasses (UVA+UVB protection) and wide brim hats (4-inch brim around the entire circumference of the hat). Call for new or changing lesions. Destruction of lesion - L malar cheek x 2, R medial eyebrow x 1, R zygoma x 1, R medial cheek x 2, chin x 1, upper lip x 1, (8)  Destruction method: cryotherapy   Informed consent: discussed and consent obtained   Lesion destroyed using liquid nitrogen: Yes   Region frozen until ice ball  extended beyond lesion: Yes   Outcome: patient tolerated procedure well with no complications   Post-procedure details: wound care instructions given   Additional details:  Prior to procedure, discussed risks of blister formation, small wound, skin dyspigmentation, or rare scar following cryotherapy. Recommend Vaseline ointment to treated areas while healing.   INFLAMED  SEBORRHEIC KERATOSIS R neck x 1 Symptomatic, irritating, patient would like treated. Destruction of lesion - R neck x 1  Destruction method: cryotherapy   Informed consent: discussed and consent obtained   Lesion destroyed using liquid nitrogen: Yes   Region frozen until ice ball extended beyond lesion: Yes   Outcome: patient tolerated procedure well with no complications   Post-procedure details: wound care instructions given   Additional details:  Prior to procedure, discussed risks of blister formation, small wound, skin dyspigmentation, or rare scar following cryotherapy. Recommend Vaseline ointment to treated areas while healing.    Return for as scheduled for TBSE, Hx of BCC, Hx of AKs, Hx of SCC IS.  I, Sonya Hupman, RMA, am acting as scribe for Rexene Rattler, MD .   Documentation: I have reviewed the above documentation for accuracy and completeness, and I agree with the above.  Rexene Rattler, MD

## 2024-06-22 NOTE — Patient Instructions (Addendum)
 Cryotherapy Aftercare  Wash gently with soap and water everyday.   Apply Vaseline and Band-Aid daily until healed.   For Right thumbnail Start Ketoconazole  2% cream twice a day to fingernail  Due to recent changes in healthcare laws, you may see results of your pathology and/or laboratory studies on MyChart before the doctors have had a chance to review them. We understand that in some cases there may be results that are confusing or concerning to you. Please understand that not all results are received at the same time and often the doctors may need to interpret multiple results in order to provide you with the best plan of care or course of treatment. Therefore, we ask that you please give us  2 business days to thoroughly review all your results before contacting the office for clarification. Should we see a critical lab result, you will be contacted sooner.   If You Need Anything After Your Visit  If you have any questions or concerns for your doctor, please call our main line at [phone] and press option 4 to reach your doctor's medical assistant. If no one answers, please leave a voicemail as directed and we will return your call as soon as possible. Messages left after 4 pm will be answered the following business day.   You may also send us  a message via MyChart. We typically respond to MyChart messages within 1-2 business days.  For prescription refills, please ask your pharmacy to contact our office. Our fax number is [phone].  If you have an urgent issue when the clinic is closed that cannot wait until the next business day, you can page your doctor at the number below.    Please note that while we do our best to be available for urgent issues outside of office hours, we are not available 24/7.   If you have an urgent issue and are unable to reach us , you may choose to seek medical care at your doctor's office, retail clinic, urgent care center, or emergency room.  If you  have a medical emergency, please immediately call 911 or go to the emergency department.  Pager Numbers  - Dr. Hester: [phone]  - Dr. Jackquline: [phone]  - Dr. Claudene: [phone]   - Dr. Raymund: [phone]  In the event of inclement weather, please call our main line at [phone] for an update on the status of any delays or closures.  Dermatology Medication Tips: Please keep the boxes that topical medications come in in order to help keep track of the instructions about where and how to use these. Pharmacies typically print the medication instructions only on the boxes and not directly on the medication tubes.   If your medication is too expensive, please contact our office at [phone] option 4 or send us  a message through MyChart.   We are unable to tell what your co-pay for medications will be in advance as this is different depending on your insurance coverage. However, we may be able to find a substitute medication at lower cost or fill out paperwork to get insurance to cover a needed medication.   If a prior authorization is required to get your medication covered by your insurance company, please allow us  1-2 business days to complete this process.  Drug prices often vary depending on where the prescription is filled and some pharmacies may offer cheaper prices.  The website www.goodrx.com contains coupons for medications through different pharmacies. The prices here do not account for what the cost  may be with help from insurance (it may be cheaper with your insurance), but the website can give you the price if you did not use any insurance.  - You can print the associated coupon and take it with your prescription to the pharmacy.  - You may also stop by our office during regular business hours and pick up a GoodRx coupon card.  - If you need your prescription sent electronically to a different pharmacy, notify our office through [facility] or by phone  at [phone] option 4.     Si Usted Necesita Algo Despus de Su Visita  Tambin puede enviarnos un mensaje a travs de Clinical cytogeneticist. Por lo general respondemos a los mensajes de MyChart en el transcurso de 1 a 2 das hbiles.  Para renovar recetas, por favor pida a su farmacia que se ponga en contacto con nuestra oficina. Randi lakes de fax es [place] [phone].  Si tiene un asunto urgente cuando la clnica est cerrada y que no puede esperar hasta el siguiente da hbil, puede llamar/localizar a su doctor(a) al nmero que aparece a continuacin.   Por favor, tenga en cuenta que aunque hacemos todo lo posible para estar disponibles para asuntos urgentes fuera del horario de [place], no estamos disponibles las 24 horas del da, los 7 [address]  [address] de la [place].   Si tiene un problema urgente y no puede comunicarse con nosotros, puede optar por buscar atencin mdica  en el consultorio de su doctor(a), en una clnica privada, en un centro de atencin urgente o en una sala de emergencias.  Si tiene Engineer, drilling, por favor llame inmediatamente al 911 o vaya a la sala de emergencias.  Nmeros de bper  - Dr. Hester: [phone]  - Dra. Jackquline: [phone]  - Dr. Claudene: [phone]  - Dra. Kitts: [phone]  En caso de inclemencias del [place], por favor llame a nuestra lnea principal al [phone] para una actualizacin sobre el estado de cualquier retraso o cierre.  Consejos para la medicacin en dermatologa: Por favor, guarde las cajas en las que vienen los medicamentos de uso tpico para ayudarle a seguir las instrucciones sobre dnde y cmo usarlos. Las farmacias generalmente imprimen las instrucciones del medicamento slo en las cajas y no directamente en los tubos del [place].   Si su medicamento es muy caro, por favor, pngase en contacto con landry rieger llamando al [phone] y presione la opcin 4 o envenos un mensaje a travs de Clinical cytogeneticist.   No podemos  decirle cul ser su copago por los medicamentos por adelantado ya que esto es diferente dependiendo de la cobertura de su seguro. Sin embargo, es posible que podamos encontrar un medicamento sustituto a Audiological scientist un formulario para que el seguro cubra el medicamento que se considera necesario.   Si se requiere una autorizacin previa para que su compaa de seguros malta su medicamento, por favor permtanos de 1 a 2 das hbiles para completar este proceso.  Los precios de los medicamentos varan con frecuencia dependiendo del Environmental consultant de dnde se surte la receta y alguna farmacias pueden ofrecer precios ms baratos.  El sitio web www.goodrx.com tiene cupones para medicamentos de Health and safety inspector. Los precios aqu no tienen en cuenta lo que podra costar con la ayuda del seguro (puede ser ms barato con su seguro), pero el sitio web puede darle el precio si no utiliz Tourist information centre manager.  - Puede imprimir el cupn correspondiente y llevarlo con su receta a la farmacia.  -  Tambin puede pasar por nuestra oficina durante el horario de atencin regular y Education officer, museum una tarjeta de cupones de GoodRx.  - Si necesita que su receta se enve electrnicamente a una farmacia diferente, informe a nuestra oficina a travs de MyChart de [place] o por telfono llamando al [phone] y presione la opcin 4.

## 2024-07-04 ENCOUNTER — Encounter: Payer: Self-pay | Admitting: Radiology

## 2024-08-01 ENCOUNTER — Other Ambulatory Visit: Payer: Self-pay | Admitting: Dermatology

## 2024-08-01 ENCOUNTER — Ambulatory Visit: Admitting: Dermatology

## 2024-08-01 DIAGNOSIS — L720 Epidermal cyst: Secondary | ICD-10-CM | POA: Diagnosis not present

## 2024-08-01 DIAGNOSIS — L821 Other seborrheic keratosis: Secondary | ICD-10-CM

## 2024-08-01 DIAGNOSIS — Z1283 Encounter for screening for malignant neoplasm of skin: Secondary | ICD-10-CM

## 2024-08-01 DIAGNOSIS — D2271 Melanocytic nevi of right lower limb, including hip: Secondary | ICD-10-CM

## 2024-08-01 DIAGNOSIS — B351 Tinea unguium: Secondary | ICD-10-CM

## 2024-08-01 DIAGNOSIS — L814 Other melanin hyperpigmentation: Secondary | ICD-10-CM | POA: Diagnosis not present

## 2024-08-01 DIAGNOSIS — L578 Other skin changes due to chronic exposure to nonionizing radiation: Secondary | ICD-10-CM

## 2024-08-01 DIAGNOSIS — Z85828 Personal history of other malignant neoplasm of skin: Secondary | ICD-10-CM

## 2024-08-01 DIAGNOSIS — Z86007 Personal history of in-situ neoplasm of skin: Secondary | ICD-10-CM

## 2024-08-01 DIAGNOSIS — H01136 Eczematous dermatitis of left eye, unspecified eyelid: Secondary | ICD-10-CM

## 2024-08-01 DIAGNOSIS — D1801 Hemangioma of skin and subcutaneous tissue: Secondary | ICD-10-CM

## 2024-08-01 DIAGNOSIS — D229 Melanocytic nevi, unspecified: Secondary | ICD-10-CM

## 2024-08-01 DIAGNOSIS — W908XXA Exposure to other nonionizing radiation, initial encounter: Secondary | ICD-10-CM

## 2024-08-01 DIAGNOSIS — L738 Other specified follicular disorders: Secondary | ICD-10-CM

## 2024-08-01 DIAGNOSIS — Z872 Personal history of diseases of the skin and subcutaneous tissue: Secondary | ICD-10-CM

## 2024-08-01 DIAGNOSIS — H019 Unspecified inflammation of eyelid: Secondary | ICD-10-CM

## 2024-08-01 MED ORDER — PIMECROLIMUS 1 % EX CREA: TOPICAL_CREAM | Freq: Two times a day (BID) | CUTANEOUS | 2 refills | Status: DC

## 2024-08-01 MED ORDER — TAVABOROLE 5 % EX SOLN: 1.0000 | Freq: Every evening | CUTANEOUS | 3 refills | Status: AC

## 2024-08-01 NOTE — Patient Instructions (Addendum)
 For Milia on face You can restart Differin 0.1% gel nightly to white bumps on face  For eyelid dermatitis Start Elidel cream twice a day as needed for flares  For yeast on right thumbnail Start Kerydin solution nightly to thumbnail Continue Ketoconazole  2% cream in the morning  Continue Amlactin lotion to arms, legs as moisturizer  Due to recent changes in healthcare laws, you may see results of your pathology and/or laboratory studies on MyChart before the doctors have had a chance to review them. We understand that in some cases there may be results that are confusing or concerning to you. Please understand that not all results are received at the same time and often the doctors may need to interpret multiple results in order to provide you with the best plan of care or course of treatment. Therefore, we ask that you please give us  2 business days to thoroughly review all your results before contacting the office for clarification. Should we see a critical lab result, you will be contacted sooner.   If You Need Anything After Your Visit  If you have any questions or concerns for your doctor, please call our main line at [phone] and press option 4 to reach your doctor's medical assistant. If no one answers, please leave a voicemail as directed and we will return your call as soon as possible. Messages left after 4 pm will be answered the following business day.   You may also send us  a message via MyChart. We typically respond to MyChart messages within 1-2 business days.  For prescription refills, please ask your pharmacy to contact our office. Our fax number is [phone].  If you have an urgent issue when the clinic is closed that cannot wait until the next business day, you can page your doctor at the number below.    Please note that while we do our best to be available for urgent issues outside of office hours, we are not available 24/7.   If you have an urgent issue and are  unable to reach us , you may choose to seek medical care at your doctor's office, retail clinic, urgent care center, or emergency room.  If you have a medical emergency, please immediately call 911 or go to the emergency department.  Pager Numbers  - Dr. Hester: [phone]  - Dr. Jackquline: [phone]  - Dr. Claudene: [phone]   - Dr. Raymund: [phone]  In the event of inclement weather, please call our main line at [phone] for an update on the status of any delays or closures.  Dermatology Medication Tips: Please keep the boxes that topical medications come in in order to help keep track of the instructions about where and how to use these. Pharmacies typically print the medication instructions only on the boxes and not directly on the medication tubes.   If your medication is too expensive, please contact our office at [phone] option 4 or send us  a message through MyChart.   We are unable to tell what your co-pay for medications will be in advance as this is different depending on your insurance coverage. However, we may be able to find a substitute medication at lower cost or fill out paperwork to get insurance to cover a needed medication.   If a prior authorization is required to get your medication covered by your insurance company, please allow us  1-2 business days to complete this process.  Drug prices often vary depending on where the prescription is filled and some pharmacies  may offer cheaper prices.  The website www.goodrx.com contains coupons for medications through different pharmacies. The prices here do not account for what the cost may be with help from insurance (it may be cheaper with your insurance), but the website can give you the price if you did not use any insurance.  - You can print the associated coupon and take it with your prescription to the pharmacy.  - You may also stop by our office during regular business hours and pick up a GoodRx coupon  card.  - If you need your prescription sent electronically to a different pharmacy, notify our office through [facility] or by phone at [phone] option 4.     Si Usted Necesita Algo Despus de Su Visita  Tambin puede enviarnos un mensaje a travs de Clinical Cytogeneticist. Por lo general respondemos a los mensajes de MyChart en el transcurso de 1 a 2 das hbiles.  Para renovar recetas, por favor pida a su farmacia que se ponga en contacto con nuestra oficina. Randi lakes de fax es [place] [phone].  Si tiene un asunto urgente cuando la clnica est cerrada y que no puede esperar hasta el siguiente da hbil, puede llamar/localizar a su doctor(a) al nmero que aparece a continuacin.   Por favor, tenga en cuenta que aunque hacemos todo lo posible para estar disponibles para asuntos urgentes fuera del horario de [place], no estamos disponibles las 24 horas del da, los 7 [address]  [address] de la [place].   Si tiene un problema urgente y no puede comunicarse con nosotros, puede optar por buscar atencin mdica  en el consultorio de su doctor(a), en una clnica privada, en un centro de atencin urgente o en una sala de emergencias.  Si tiene engineer, drilling, por favor llame inmediatamente al 911 o vaya a la sala de emergencias.  Nmeros de bper  - Dr. Hester: [phone]  - Dra. Jackquline: [phone]  - Dr. Claudene: [phone]  - Dra. Kitts: [phone]  En caso de inclemencias del [place], por favor llame a nuestra lnea principal al [phone] para una actualizacin sobre el estado de cualquier retraso o cierre.  Consejos para la medicacin en dermatologa: Por favor, guarde las cajas en las que vienen los medicamentos de uso tpico para ayudarle a seguir las instrucciones sobre dnde y cmo usarlos. Las farmacias generalmente imprimen las instrucciones del medicamento slo en las cajas y no directamente en los tubos del [place].   Si su medicamento es muy caro, por favor, pngase  en contacto con landry rieger llamando al [phone] y presione la opcin 4 o envenos un mensaje a travs de Clinical Cytogeneticist.   No podemos decirle cul ser su copago por los medicamentos por adelantado ya que esto es diferente dependiendo de la cobertura de su seguro. Sin embargo, es posible que podamos encontrar un medicamento sustituto a audiological scientist un formulario para que el seguro cubra el medicamento que se considera necesario.   Si se requiere una autorizacin previa para que su compaa de seguros cubra su medicamento, por favor permtanos de 1 a 2 das hbiles para completar este proceso.  Los precios de los medicamentos varan con frecuencia dependiendo del environmental consultant de dnde se surte la receta y alguna farmacias pueden ofrecer precios ms baratos.  El sitio web www.goodrx.com tiene cupones para medicamentos de health and safety inspector. Los precios aqu no tienen en cuenta lo que podra costar con la ayuda del seguro (puede ser ms barato con su seguro), pero el sitio web puede  darle el precio si no utiliz [employer].  - Puede imprimir el cupn correspondiente y llevarlo con su receta a la farmacia.  - Tambin puede pasar por nuestra oficina durante el horario de atencin regular y education officer, museum una tarjeta de cupones de GoodRx.  - Si necesita que su receta se enve electrnicamente a una farmacia diferente, informe a nuestra oficina a travs de MyChart de [place] o por telfono llamando al [phone] y presione la opcin 4.

## 2024-08-01 NOTE — Progress Notes (Signed)
 Follow-Up Visit   Subjective  Jody  [name] is a 78 y.o. female who presents for the following: Skin Cancer Screening and Full Body Skin Exam hx of BCC, SCC IS, Aks, Tinea Unguium, possible yeast, R thumbnail, Ketoconazole  2% cr bid, Milia face, pt used Differin 0.1% gel, pt d/c because it was bringing the white bumps to surface, a few of them are sore and irritating, check L upper eyelid, >10m burns, itching, used Erythromycin oint  The patient presents for Total-Body Skin Exam (TBSE) for skin cancer screening and mole check. The patient has spots, moles and lesions to be evaluated, some may be new or changing and the patient may have concern these could be cancer.    The following portions of the chart were reviewed this encounter and updated as appropriate: medications, allergies, medical history  Review of Systems:  No other skin or systemic complaints except as noted in HPI or Assessment and Plan.  Objective  Well appearing patient in no apparent distress; mood and affect are within normal limits.  A full examination was performed including scalp, head, eyes, ears, nose, lips, neck, chest, axillae, abdomen, back, buttocks, bilateral upper extremities, bilateral lower extremities, hands, feet, fingers, toes, fingernails, and toenails. All findings within normal limits unless otherwise noted below.   Relevant physical exam findings are noted in the Assessment and Plan.  R thumbnail  perioral chin Firm white papules perioral  Assessment & Plan   SKIN CANCER SCREENING PERFORMED TODAY.  ACTINIC DAMAGE - Chronic condition, secondary to cumulative UV/sun exposure - diffuse scaly erythematous macules with underlying dyspigmentation - Recommend daily broad spectrum sunscreen SPF 30+ to sun-exposed areas, reapply every 2 hours as needed.  - Staying in the shade or wearing long sleeves, sun glasses (UVA+UVB protection) and wide brim hats (4-inch brim around the entire  circumference of the hat) are also recommended for sun protection.  - Call for new or changing lesions.  LENTIGINES, SEBORRHEIC KERATOSES, HEMANGIOMAS - Benign normal skin lesions - Benign-appearing - Call for any changes - Sks inframammary  MELANOCYTIC NEVI - Tan-brown and/or pink-flesh-colored symmetric macules and papules - R 3rd dorsal toe 4 mm med brown macule - Benign appearing on exam today - Observation - Call clinic for new or changing moles - Recommend daily use of broad spectrum spf 30+ sunscreen to sun-exposed areas.    HISTORY OF BASAL CELL CARCINOMA OF THE SKIN - No evidence of recurrence today- R lower pretibia - Recommend regular full body skin exams - Recommend daily broad spectrum sunscreen SPF 30+ to sun-exposed areas, reapply every 2 hours as needed.  - Call if any new or changing lesions are noted between office visits    HISTORY OF SQUAMOUS CELL CARCINOMA IN SITU OF THE SKIN - No evidence of recurrence today- L dorsal hand - Recommend regular full body skin exams - Recommend daily broad spectrum sunscreen SPF 30+ to sun-exposed areas, reapply every 2 hours as needed.  - Call if any new or changing lesions are noted between office visits    HISTORY OF PRECANCEROUS ACTINIC KERATOSIS - site(s) of PreCancerous Actinic Keratosis clear today. - these may recur and new lesions may form requiring treatment to prevent transformation into skin cancer - observe for new or changing spots and contact [facility] for appointment if occur - photoprotection with sun protective clothing; sunglasses and broad spectrum sunscreen with SPF of at least 30 + and frequent self skin exams recommended - yearly exams by a dermatologist  recommended for persons with history of PreCancerous Actinic Keratoses   Milia face - tiny firm white papules - type of cyst - benign - sometimes these will clear with nightly OTC adapalene/Differin 0.1% gel or retinol. - may be  extracted if symptomatic - observe  Sebaceous Hyperplasia face - Small yellow papules with a central dell - Benign-appearing - Observe. Call for changes.   EYELID DERMATITIS L upper eyelid Exam: Mild erythema and scale  Chronic and persistent condition with duration or expected duration over one year. Condition is symptomatic/ bothersome to patient. Not currently at goal.   Atopic dermatitis (eczema) is a chronic, relapsing, pruritic condition that can significantly affect quality of life. It is often associated with allergic rhinitis and/or asthma and can require treatment with topical medications, phototherapy, or in severe cases biologic injectable medication (Dupixent, Adbry, Ebglyss) or oral JAK inhibitors (Rinvoq, Cibinqo).    Treatment Plan:  Start Elidel cr bid aa L upper eyelid until rash cleared and prn flares  TINEA UNGUIUM, possible yeast R thumbnail Exam: lat onycholysis absent cuticle with darkening at lat nail fold  Chronic and persistent condition with duration or expected duration over one year. Condition is symptomatic/ bothersome to patient. Not currently at goal.  Treatment Plan: Start Kerydin sol qhs to R thumbnail Cont Ketoconazole  2% cr qam to R thumb   MILIA perioral chin Symptomatic, irritating, patient would like treated.  Acne/Milia surgery - perioral chin Benign Destruction procedure for symptomatic milia/cysts: Procedure risks and benefits were discussed with the patient (bleeding, bruising, small scar) and verbal consent was obtained. Following alcohol prep of the skin, the R chin, L chin was anesthetized with 1% lido/epi injection. Incision performed with 11 blade and lesion removed and destroyed with pressure and curretage.  Total # lesions destroyed, 3, R chin x 1, L chin x 2.  Vaseline ointment was applied to each site. The patient tolerated the procedure well.  Return in about 1 year (around 08/01/2025) for TBSE, Hx of BCC, Hx of SCC IS, Hx of  AKs.  I, Grayce Saunas, RMA, am acting as scribe for Rexene Rattler, MD .   Documentation: I have reviewed the above documentation for accuracy and completeness, and I agree with the above.  Rexene Rattler, MD

## 2024-08-21 ENCOUNTER — Other Ambulatory Visit: Payer: Self-pay

## 2024-08-21 ENCOUNTER — Emergency Department

## 2024-08-21 ENCOUNTER — Emergency Department
Admission: EM | Admit: 2024-08-21 | Discharge: 2024-08-21 | Disposition: A | Discharge status: Home / Self Care | Attending: Emergency Medicine | Admitting: Emergency Medicine

## 2024-08-21 DIAGNOSIS — S32020A Wedge compression fracture of second lumbar vertebra, initial encounter for closed fracture: Secondary | ICD-10-CM | POA: Diagnosis not present

## 2024-08-21 DIAGNOSIS — S3992XA Unspecified injury of lower back, initial encounter: Secondary | ICD-10-CM | POA: Diagnosis present

## 2024-08-21 DIAGNOSIS — J45909 Unspecified asthma, uncomplicated: Secondary | ICD-10-CM | POA: Insufficient documentation

## 2024-08-21 DIAGNOSIS — X58XXXA Exposure to other specified factors, initial encounter: Secondary | ICD-10-CM | POA: Insufficient documentation

## 2024-08-21 LAB — URINALYSIS, ROUTINE W REFLEX MICROSCOPIC
Bilirubin Urine: NEGATIVE
Glucose, UA: NEGATIVE mg/dL
Hgb urine dipstick: NEGATIVE
Ketones, ur: NEGATIVE mg/dL
Leukocytes,Ua: NEGATIVE
Nitrite: NEGATIVE
Protein, ur: NEGATIVE mg/dL
Specific Gravity, Urine: 1.006 (ref 1.005–1.030)
pH: 7 (ref 5.0–8.0)

## 2024-08-21 MED ORDER — OXYCODONE HCL 5 MG PO TABS: 5.0000 mg | ORAL_TABLET | Freq: Four times a day (QID) | ORAL | 0 refills | Status: AC | PRN

## 2024-08-21 MED ORDER — ONDANSETRON 4 MG PO TBDP
4.0000 mg | ORAL_TABLET | Freq: Once | ORAL | Status: AC
  Administered 2024-08-21: 4 mg via ORAL
  Filled 2024-08-21: qty 1

## 2024-08-21 MED ORDER — LIDOCAINE 5 % EX PTCH: 1.0000 | MEDICATED_PATCH | Freq: Two times a day (BID) | CUTANEOUS | 0 refills | Status: AC

## 2024-08-21 MED ORDER — OXYCODONE HCL 5 MG PO TABS
5.0000 mg | ORAL_TABLET | Freq: Once | ORAL | Status: AC
  Administered 2024-08-21: 5 mg via ORAL
  Filled 2024-08-21: qty 1

## 2024-08-21 MED ORDER — ACETAMINOPHEN 325 MG PO TABS: 650.0000 mg | ORAL_TABLET | Freq: Four times a day (QID) | ORAL | Status: DC | PRN

## 2024-08-21 MED ORDER — LIDOCAINE 5 % EX PTCH
1.0000 | MEDICATED_PATCH | CUTANEOUS | Status: DC
  Administered 2024-08-21: 1 via TRANSDERMAL
  Filled 2024-08-21: qty 1

## 2024-08-21 NOTE — ED Triage Notes (Signed)
 Pt arrives via pov accompanied by s/o. Reports concern for lower back pain worsening over the past week. Sts she has chronic back pain typically higher in locations s/t compression fx. No new fall/injury. No leg weakness or numbness. Reports concern for increased urinary frequency. No fevers/chills. No abd discomfort. Has been taking tylenol  with some improvement. Last taken at 10 pm yesterday.

## 2024-08-21 NOTE — ED Provider Notes (Signed)
 "  [facility] Provider Note    Event Date/Time   First MD Initiated Contact with Patient 08/21/24 [phone]     (approximate)   History   Chief Complaint Back Pain   HPI  Jody Avery is a 78 y.o. female with past medical history of hyperlipidemia, asthma, and lumbar disc disease who presents to the ED complaining of back pain.  Patient reports that she has had frequent cough this week which she attributes to a viral illness, subsequently developed increasing pain in the middle of her lower back over the past 2 days.  She denies any falls or other trauma, has not had any numbness or weakness in her lower extremities.  She also denies any saddle anesthesia or bowel/bladder incontinence.  She describes symptoms as similar to prior compression fractures that she had higher up in her back.     Physical Exam   Triage Vital Signs: ED Triage Vitals  Encounter Vitals Group     BP 08/21/24 0436 (!) 157/100     Girls Systolic BP Percentile --      Girls Diastolic BP Percentile --      Boys Systolic BP Percentile --      Boys Diastolic BP Percentile --      Pulse Rate 08/21/24 0436 91     Resp 08/21/24 0436 19     Temp 08/21/24 0436 99 F (37.2 C)     Temp src --      SpO2 08/21/24 0436 95 %     Weight 08/21/24 0437 170 lb (77.1 kg)     Height 08/21/24 0437 5' (1.524 m)     Head Circumference --      Peak Flow --      Pain Score 08/21/24 0440 10     Pain Loc --      Pain Education --      Exclude from Growth Chart --     Most recent vital signs: Vitals:   08/21/24 0436  BP: (!) 157/100  Pulse: 91  Resp: 19  Temp: 99 F (37.2 C)  SpO2: 95%    Constitutional: Alert and oriented. Eyes: Conjunctivae are normal. Head: Atraumatic. Nose: No congestion/rhinnorhea. Mouth/Throat: Mucous membranes are moist.  Cardiovascular: Normal rate, regular rhythm. Grossly normal heart sounds.  2+ radial and DP pulses bilaterally. Respiratory: Normal  respiratory effort.  No retractions. Lungs CTAB. Gastrointestinal: Soft and nontender. No distention. Musculoskeletal: No lower extremity tenderness nor edema.  Midline lumbar spinal tenderness to palpation. Neurologic:  Normal speech and language. No gross focal neurologic deficits are appreciated.    ED Results / Procedures / Treatments   Labs (all labs ordered are listed, but only abnormal results are displayed) Labs Reviewed  URINALYSIS, ROUTINE W REFLEX MICROSCOPIC - Abnormal; Notable for the following components:      Result Value   Color, Urine STRAW (*)    APPearance CLEAR (*)    All other components within normal limits    RADIOLOGY CT lumbar spine reviewed and interpreted by me with compression fracture noted without retropulsion.  PROCEDURES:  Critical Care performed: No  Procedures   MEDICATIONS ORDERED IN ED: Medications  acetaminophen  (TYLENOL ) tablet 650 mg (has no administration in time range)  lidocaine  (LIDODERM ) 5 % 1 patch (1 patch Transdermal Patch Applied 08/21/24 0605)  ondansetron  (ZOFRAN -ODT) disintegrating tablet 4 mg (4 mg Oral Given 08/21/24 0532)  oxyCODONE  (Oxy IR/ROXICODONE ) immediate release tablet 5 mg (5 mg Oral Given 08/21/24 0604)  IMPRESSION / MDM / ASSESSMENT AND PLAN / ED COURSE  I reviewed the triage vital signs and the nursing notes.                              78 y.o. female with past medical history of hyperlipidemia, asthma, and lumbar disc disease who presents to the ED complaining of 2 days of acute back pain after frequent cough earlier in the week.  Patient's presentation is most consistent with acute complicated illness / injury requiring diagnostic workup.  Differential diagnosis includes, but is not limited to, compression fracture, strain, UTI, radiculopathy, cauda equina.  Patient well-appearing and in no acute distress, vital signs are unremarkable.  She is neurovascular intact to her bilateral lower  extremities with no features concerning for cauda equina.  CT imaging performed and remarkable for age-indeterminate L2 compression fracture without retropulsion.  Pain significantly improved following dose of oxycodone  and Lidoderm  patch, patient currently ambulatory with minimal difficulty.  She was offered LSO placement here in the ED, but states that she has a brace at home from recent compression fractures higher up in her spine.  She is appropriate for outpatient follow-up with neurosurgery, was counseled to wear a brace and we will prescribe pain medication.  She was counseled to return to the ED for new or worsening symptoms, patient agrees with plan.      FINAL CLINICAL IMPRESSION(S) / ED DIAGNOSES   Final diagnoses:  Compression fracture of L2 vertebra, initial encounter (HCC)     Rx / DC Orders   ED Discharge Orders          Ordered    oxyCODONE  (ROXICODONE ) 5 MG immediate release tablet  Every 6 hours PRN        08/21/24 0736    lidocaine  (LIDODERM ) 5 %  Every 12 hours        08/21/24 0736             Note:  This document was prepared using Dragon voice recognition software and may include unintentional dictation errors.   Willo Dunnings, MD 08/21/24 [phone]  "

## 2024-08-21 NOTE — ED Notes (Signed)
 Pt reports increased lower back pain since coughing from flu. Declines to sit on bed, states feels better when sitting up in chair

## 2024-08-21 NOTE — ED Notes (Signed)
 Pt assisted to restroom.

## 2024-08-23 NOTE — Progress Notes (Signed)
 Chief Complaint  Patient presents with   Hospital Follow Up    Compression fracture of L2 vertebra, initial encounter [facility])    Patient is agreeable to [employer] scribe.   History of Present Illness Jody  LELON Avery is an 78 year old female with a history of spinal fractures who presents with new onset lower back pain following a respiratory infection.  She recently visited the ER on August 21, 2024, for new onset lower back pain that began after a severe cough associated with a respiratory infection, diagnosed as the flu. She has not had a fever for several days, but the cough persists.  In the ER, she was found to have an L2 compression fracture. She was prescribed oxycodone  and lidoderm  patches, which provided some improvement. She has an appointment with a neurosurgeon on September 21, 2024.  She describes the pain as 'the worst pain on earth' and similar to previous experiences with spinal issues. She has a history of surgeries on three discs. The current pain is managed with oxycodone , which she took at 7:35 AM, and Tylenol . She notes that the current prescription is plain oxycodone , unlike a previous prescription that included acetaminophen .  She reports issues with urinary incontinence, which worsened after the flu and the back fracture. She changes clothes multiple times at night due to this issue. No burning sensation or symptoms of a urinary tract infection.  She has a history of osteoporosis and has previously experienced fractures at L3, L1, and T12. She mentions difficulty with insurance approval for kyphoplasty surgery in the past, which took about a month.  She is currently using a brace. She reports that it helps hold her together, although it was uncomfortable during previous fractures.    ROS  Review of systems is unremarkable for any active cardiac, respiratory, GI, GU, hematologic, neurologic, dermatologic, HEENT, or psychiatric symptoms except as noted above.  No  fevers, chills, or constitutional symptoms.   Current Outpatient Medications  Medication Sig Dispense Refill   acetaminophen  (TYLENOL ) 500 MG tablet Take 500 mg by mouth     cholecalciferol (VITAMIN D3) 1000 unit capsule Take 1,000 Units by mouth 2 (two) times daily Takes 4,000 u daily     cyanocobalamin (VITAMIN B12) 1000 MCG tablet Take 1 tablet (1,000 mcg total) by mouth once a week     famotidine (PEPCID) 40 MG tablet Take 1 tablet (40 mg total) by mouth at bedtime 90 tablet 3   omeprazole (PRILOSEC) 40 MG DR capsule Take 1 capsule (40 mg total) by mouth once daily 90 capsule 3   oxyCODONE  (ROXICODONE ) 5 MG immediate release tablet Take 1 tablet (5 mg total) by mouth 3 (three) times daily as needed for Pain for up to 5 days 15 tablet 0   polyethylene glycol (MIRALAX) packet Take 17 g by mouth once daily Mix in 4-8ounces of fluid prior to taking.     predniSONE  (DELTASONE ) 10 MG tablet Take 1 tablet (10 mg total) by mouth once daily for 7 days 7 tablet 0   TORsemide (DEMADEX) 5 MG tablet Take 1 tablet (5 mg total) by mouth once daily as needed (edema) 30 tablet 11   albuterol  MDI, PROVENTIL , VENTOLIN , PROAIR , HFA 90 mcg/actuation inhaler Inhale 2 inhalations into the lungs every 4 (four) hours as needed (Patient not taking: Reported on 08/23/2024) 1 each 5   calcitonin, salmon, (MIACALCIN) 200 unit/actuation nasal spray Place 1 spray into the left nostril once daily 3.7 mL 11   DENTA 5000  PLUS 1.1 % 2 (two) times daily (Patient not taking: Reported on 08/23/2024)     lidocaine  (LIDODERM ) 5 % patch Place 1 patch onto the skin daily for 30 days Apply patch to the most painful area for up to 12 hours in a 24 hour period. 30 patch 0   No current facility-administered medications for this visit.    Allergies as of 08/23/2024 - Reviewed 08/23/2024  Allergen Reaction Noted   Alendronate sodium Diarrhea 08/11/2014   Azithromycin  Diarrhea 08/11/2014   Ceftin [cefuroxime axetil]  Other (See Comments) 11/04/2021   Cefuroxime Unknown 01/22/2022   Clarithromycin Diarrhea 08/11/2014   Codeine Nausea 08/11/2014   Diclofenac sodium Itching 08/11/2014   Fish oil Other (See Comments) 08/11/2014   Meloxicam Itching 08/11/2014   Nsaids (non-steroidal anti-inflammatory drug) Other (See Comments) 12/30/2022   Penicillins Swelling and Other (See Comments) 08/11/2014   Prolia [denosumab] Other (See Comments) 11/28/2020   Rabeprazole sodium Diarrhea 08/11/2014   Raloxifene Muscle Pain and Other (See Comments) 08/11/2014   Metronidazole  Unknown and Nausea 04/20/2020   Rabeprazole Nausea and Other (See Comments) 04/06/2007   Sulfa (sulfonamide antibiotics) Rash and Other (See Comments) 04/06/2007    Patient Active Problem List  Diagnosis   Lumbar disc disease   Hyperlipidemia, mixed   Medicare annual wellness visit, initial   Adult idiopathic generalized osteoporosis   RAD (reactive airway disease), moderate persistent, uncomplicated ([facility]-HCC)   B12 deficiency   Irritable bowel syndrome with constipation   Serrated adenoma of colon   Symptomatic PVCs   Pulmonary nodule   Closed compression fracture of L3 lumbar vertebra with delayed healing, subsequent encounter   Laryngopharyngeal reflux (LPR)    Past Medical History:  Diagnosis Date   Allergy    Arrhythmia ???? pvc   Arthritis    Asthma without status asthmaticus ([facility]HS-HCC)    Chicken pox    GERD (gastroesophageal reflux disease)    Hyperlipidemia    Osteoporosis    RAD (reactive airway disease), moderate persistent, uncomplicated ([facility]HS-HCC) 11/28/2020    Past Surgical History:  Procedure Laterality Date   Right knee arthroscopy and meniscus repair Right 10/08/2011   Dr. Mardee   Right ring, middle, and index fingers trigger release Right 02/13/2012   BACK SURGERY  2016   Ruptured disk repair   COLONOSCOPY  01/23/2022   Sessile Serrated Adenoma/No repeat due to  age/SMR   EGD  01/23/2022   Fundic gland polyp/No repeat/SMR   CATARACT EXTRACTION  2020   HYSTERECTOMY  1989   KNEE ARTHROSCOPY  2013   root canal     01/2024 x 2    Vitals:   08/23/24 1017  BP: 126/82  Pulse: 77  SpO2: 97%  Weight: 79.5 kg (175 lb 3.2 oz)  Height: 152.4 cm (5')  PainSc:   8  PainLoc: Back   Body mass index is 34.22 kg/m.  Exam BP 126/82 (BP Location: Left upper arm, Patient Position: Sitting, BP Cuff Size: Adult)   Pulse 77   Ht 152.4 cm (5')   Wt 79.5 kg (175 lb 3.2 oz)   SpO2 97%   BMI 34.22 kg/m   General. Well appearing; NAD; VS reviewed     Oropharynx. No suspicious lesions Neck. Supple. No swelling, masses, thyroid  normal size, no masses palpated.   Lungs. Respirations unlabored; clear to auscultation bilaterally Cardiovascular. Heart regular rate and rhythm without murmurs, gallops, or rubs Skin. Normal color and turgor  Assessment & Plan  L2 vertebral compression fracture New L2  compression fracture due to severe coughing. Pain managed with oxycodone  and lidocaine  patches. Awaiting neurosurgical evaluation for potential kyphoplasty. Discussed nasal spray for bone healing, pending insurance. - Prescribed lidocaine  patches 5% for pain. - Continue oxycodone , consider 30-day supply after discussion with Doctor Cleotilde. - Have sent in calcitonin nasal spray to help with pain. - Follow up with neurosurgeon on January 21st, 2026.  Osteoporosis Contributing to vertebral fractures. Discussed nasal spray for bone density improvement. - Consider nasal spray for osteoporosis, pending insurance approval.  Urinary incontinence Exacerbated by respiratory infection. No spinal cord impingement. Symptoms include urgency and morning incontinence. Fluid retention may contribute. - Consider referral to urologist for further evaluation.    F/U: Patient follow-up as needed.  JASON HESTLE WHITAKER, PA  This note has been created using automated  tools and reviewed for accuracy by JASON HESTLE WHITAKER.   Note: This dictation was prepared with Dragon dictation along with smaller phrase technology. Any transcriptional errors that result from this process are unintentional.

## 2024-09-07 ENCOUNTER — Ambulatory Visit: Admitting: Physician Assistant

## 2024-09-07 ENCOUNTER — Telehealth: Payer: Self-pay

## 2024-09-07 ENCOUNTER — Other Ambulatory Visit: Payer: Self-pay | Admitting: Physician Assistant

## 2024-09-07 ENCOUNTER — Encounter: Payer: Self-pay | Admitting: Physician Assistant

## 2024-09-07 ENCOUNTER — Other Ambulatory Visit

## 2024-09-07 VITALS — BP 128/80 | Ht 59.84 in | Wt 168.6 lb

## 2024-09-07 DIAGNOSIS — M8000XA Age-related osteoporosis with current pathological fracture, unspecified site, initial encounter for fracture: Secondary | ICD-10-CM

## 2024-09-07 DIAGNOSIS — S32020A Wedge compression fracture of second lumbar vertebra, initial encounter for closed fracture: Secondary | ICD-10-CM | POA: Diagnosis not present

## 2024-09-07 DIAGNOSIS — S32000S Wedge compression fracture of unspecified lumbar vertebra, sequela: Secondary | ICD-10-CM

## 2024-09-07 DIAGNOSIS — M8008XA Age-related osteoporosis with current pathological fracture, vertebra(e), initial encounter for fracture: Secondary | ICD-10-CM | POA: Diagnosis not present

## 2024-09-07 MED ORDER — ACETAMINOPHEN 500 MG PO TABS: 1000.0000 mg | ORAL_TABLET | Freq: Four times a day (QID) | ORAL | 2 refills | Status: AC | PRN

## 2024-09-07 MED ORDER — OXYCODONE HCL 5 MG PO TABS: 5.0000 mg | ORAL_TABLET | Freq: Four times a day (QID) | ORAL | 0 refills | Status: DC | PRN

## 2024-09-07 NOTE — Progress Notes (Signed)
 "  Referring Physician:  Cleotilde Oneil FALCON, MD [address] [facility] [address] [facility] [place],  [place] [address]  Primary Physician:  Cleotilde Oneil FALCON, MD  History of Present Illness: 09/07/2024 [name] is here today with known osteoporosis and previous T12, L1, and L3 compression fractures that were treated with kyphoplasty.  Unfortunately approximately 2 weeks ago she was sick and had a frequent cough and subsequently developed increasing back pain.  She was found to have a new L2 compression fracture.  She is in a lot of pain.  She has been wearing a back brace periodically.  She has been using Tylenol  and oxycodone  for her pain.  She has not noticed any new weakness.  She finds it very difficult to become comfortable.      Conservative measures:   Multimodal medical therapy including regular antiinflammatories:  oxycodone  and Lidoderm  patch    The symptoms are causing a significant impact on the patient's life.   Review of Systems:  A 10 point review of systems is negative, except for the pertinent positives and negatives detailed in the HPI.  Past Medical History: Past Medical History:  Diagnosis Date   Actinic keratosis    Allergy    allergic rhinitis   Asthma    Basal cell carcinoma 02/07/2020   R lower pretibia   DDD (degenerative disc disease)    low back pain deg disc dz with infections   Hearing loss    mild   Hyperlipidemia    Osteopenia    Osteoporosis    RAD (reactive airway disease), moderate persistent, uncomplicated    Squamous cell carcinoma of skin 08/10/2013   left dorsal hand/in situ   Tinnitus     Past Surgical History: Past Surgical History:  Procedure Laterality Date   ABDOMINAL HYSTERECTOMY  1989   total, fibroids   BACK SURGERY     COLONOSCOPY N/A 01/23/2022   Procedure: COLONOSCOPY;  Surgeon: Onita Elspeth Sharper, DO;  Location: [facility] ENDOSCOPY;  Service: Gastroenterology;  Laterality: N/A;    ESOPHAGOGASTRODUODENOSCOPY N/A 01/23/2022   Procedure: ESOPHAGOGASTRODUODENOSCOPY (EGD);  Surgeon: Onita Elspeth Sharper, DO;  Location: [facility] ENDOSCOPY;  Service: Gastroenterology;  Laterality: N/A;   EYE SURGERY     IR KYPHO EA ADDL LEVEL THORACIC OR LUMBAR  09/16/2023   IR KYPHO LUMBAR INC FX REDUCE BONE BX UNI/BIL CANNULATION INC/IMAGING  09/16/2023   IR KYPHO THORACIC WITH BONE BIOPSY  10/13/2023   IR RADIOLOGIST EVAL & MGMT  08/11/2023   IR RADIOLOGIST EVAL & MGMT  09/04/2023   IR RADIOLOGIST EVAL & MGMT  09/25/2023   IR RADIOLOGIST EVAL & MGMT  09/29/2023   IR RADIOLOGIST EVAL & MGMT  10/27/2023   IR RADIOLOGIST EVAL & MGMT  11/03/2023   IR RADIOLOGIST EVAL & MGMT  11/26/2023   KNEE CARTILAGE SURGERY     torn meniscus  2007   left    TRIGGER FINGER RELEASE Right    right, middle and inder fingers    Allergies: Allergies as of 09/07/2024 - Review Complete 09/07/2024  Allergen Reaction Noted   Alendronate sodium Nausea And Vomiting 04/06/2007   Codeine Nausea And Vomiting    Ceftin [cefuroxime]  01/22/2022   Meloxicam Itching 03/11/2011   Penicillins  04/06/2007   Prolia [denosumab]  01/22/2022   Raloxifene  04/30/2009   Azithromycin  Diarrhea and Nausea Only 08/15/2010   Ciprofloxacin  Nausea Only 04/20/2020   Clarithromycin Nausea Only 10/14/2007   Diclofenac sodium Itching    Fish  oil Other (See Comments) 08/15/2010   Flagyl  [metronidazole ] Nausea Only 04/20/2020   Rabeprazole sodium Nausea Only 04/06/2007   Sulfa antibiotics Rash 08/11/2014   Sulfonamide derivatives Rash 04/06/2007    Medications: Outpatient Encounter Medications as of 09/07/2024  Medication Sig   acetaminophen  (TYLENOL ) 500 MG tablet Take 500 mg by mouth every 8 (eight) hours as needed for moderate pain (pain score 4-6).   azelastine (ASTELIN) 0.1 % nasal spray Place into both nostrils 2 (two) times daily. Use in each nostril as directed   cholecalciferol (VITAMIN D ) 1000 units tablet Take 4,000 Units by mouth  daily.   lidocaine  (LIDODERM ) 5 % Place 1 patch onto the skin every 12 (twelve) hours. Remove & Discard patch within 12 hours or as directed by MD   LUTEIN PO Take 1 tablet by mouth every other day.   oxyCODONE  (ROXICODONE ) 5 MG immediate release tablet Take 1 tablet (5 mg total) by mouth every 6 (six) hours as needed for severe pain (pain score 7-10).   polyethylene glycol powder (GLYCOLAX/MIRALAX) 17 GM/SCOOP powder Take 1 Container by mouth once.   Tavaborole  5 % SOLN Apply 1 Application topically at bedtime. qhs to right thumbnail   torsemide (DEMADEX) 5 MG tablet Take 5 mg by mouth daily as needed.   vitamin B-12 (CYANOCOBALAMIN) 1000 MCG tablet Take 1,000 mcg by mouth once a week.   hydrocortisone  2.5 % cream Apply to affected area rash once to twice daily as directed. (Patient not taking: Reported on 11/26/2023)   [DISCONTINUED] bisoprolol (ZEBETA) 5 MG tablet Take 2.5 mg by mouth at bedtime. (Patient not taking: Reported on 11/26/2023)   [DISCONTINUED] oxyCODONE -acetaminophen  (PERCOCET/ROXICET) 5-325 MG tablet Take 1 tablet by mouth every 4 (four) hours as needed for severe pain (pain score 7-10). (Patient not taking: Reported on 11/26/2023)   [DISCONTINUED] pimecrolimus  (ELIDEL ) 1 % cream Apply topically 2 (two) times daily. Bid to left upper eyelid prn dermatitis flares   No facility-administered encounter medications on file as of 09/07/2024.    Social History: Social History[1]  Family Medical History: Family History  Problem Relation Age of Onset   Osteoporosis Mother    Cancer Mother        ? liver cancer ? primary   Heart disease Father        MI   Osteoporosis Sister    Diabetes Sister    Heart disease Sister        CHF   Heart disease Brother        CAD   Heart disease Brother        CAD   Osteoporosis Sister    Diabetes Sister    Breast cancer Neg Hx     Physical Examination: @VITALWITHPAIN @  General: Patient is well developed, well nourished, calm, collected,  and in no apparent distress. Attention to examination is appropriate.  Psychiatric: Patient is non-anxious.  Head:  Pupils equal, round, and reactive to light.  ENT:  Oral mucosa appears well hydrated.  Neck:   Supple.    Respiratory: Patient is breathing without any difficulty.  Extremities: No edema.  Vascular: Palpable dorsal pedal pulses.  Skin:   On exposed skin, there are no abnormal skin lesions.  NEUROLOGICAL:     Awake, alert, oriented to person, place, and time.  Speech is clear and fluent. Fund of knowledge is appropriate.   Cranial Nerves: Pupils equal round and reactive to light.  Facial tone is symmetric.   ROM of spine: Patient has tenderness  palpation of her lumbar spine.  Strength:  Side Iliopsoas Quads Hamstring PF DF EHL  R 5 5 5 5 5 5   L 5 5 5 5 5 5    Reflexes are 1+ bilateral patella.  Patient has some difficulty ambulating secondary to pain.  Medical Decision Making  Imaging: CT OF THE LUMBAR SPINE WITHOUT CONTRAST 08/21/2024 06:19:19 AM   TECHNIQUE: CT of the lumbar spine was performed without the administration of intravenous contrast. Multiplanar reformatted images are provided for review. Automated exposure control, iterative reconstruction, and/or weight based adjustment of the mA/kV was utilized to reduce the radiation dose to as low as reasonably achievable.   COMPARISON: MRI lumbar spine 10/24/2023.   CLINICAL HISTORY: Back trauma, no prior imaging (Age >= 16y).   FINDINGS:   BONES AND ALIGNMENT: 5 lumbar type vertebra identified. The alignment of the lumbar spine appears normal. Chronic compression fractures involving T12, L1, and L3 with changes of cement augmentation. These are stable compared with the previous exam. There is mild loss of the vertebral body height at the L2 level which measures 1.8 cm on today's study versus 2.2 cm on 10/24/2023. This reflects an age-indeterminate fracture with no significant retropulsion.  The L4 and L5 vertebral body heights are maintained. No acute fracture or suspicious bone lesion.   DEGENERATIVE CHANGES: Moderate disc space narrowing and endplate spurring are noted at L4-L5. Mild bilateral facet arthropathy.   SOFT TISSUES: No acute abnormality.   IMPRESSION: 1. Age-indeterminate, compression fracture at L2 with mild loss of vertebral body height, without significant retropulsion. This is new when compared with the previous exam. 2. Stable chronic compression fractures at T12, L1, and L3 with cement augmentation.  I have personally reviewed the images and agree with the above interpretation.  Assessment and Plan: Ms. Venard is a pleasant 79 y.o. female is here today with known osteoporosis and previous T12, L1, and L3 compression fractures that were treated with kyphoplasty.  Unfortunately approximately 2 weeks ago she was sick and had a frequent cough and subsequently developed increasing back pain.  She was found to have a new L2 compression fracture.  She is in a lot of pain.  She has been wearing a back brace periodically.  She has been using Tylenol  and oxycodone  for her pain.  She has not noticed any new weakness.  She finds it very difficult to become comfortable.  I talked to the patient at length about need to undergo care for her osteoporosis to prevent further fractures.  Patient is very hesitant on trying new medications and is currently taking a vitamin that she found online.  She also wants to avoid driving to [place].  I will reach out to her primary care provider to see if he has any specific recommendations on someone to manage this for her closer to home.  I have reached out to her primary care provider to see if he has any formal recommendations.  Often kyphoplasty treatment is usually at 6 weeks post fracture.  I have reached out to Dr. Karalee about seeing this patient as well for pain relief for this.  Referral has also been placed. Plan to see  back in approximately 6 weeks.  Thank you for involving me in the care of this patient.    I spent a total of 60 minutes in both face-to-face and non-face-to-face activities for this visit on the date of this encounter including preparing to see the patient, obtaining and reviewing separately obtained history, performing medically  appropriate examination, counseling the patient and their family,  documenting clinical information, independently interpreting results, coordination of care.    Lyle Decamp, PA-C Dept. of Neurosurgery     [1]  Social History Tobacco Use   Smoking status: Never   Smokeless tobacco: Never  Vaping Use   Vaping status: Never Used  Substance Use Topics   Alcohol use: No    Alcohol/week: 0.0 standard drinks of alcohol   Drug use: No   "

## 2024-09-07 NOTE — Telephone Encounter (Signed)
 Looks like we may need to send this script to a different pharmacy.    Media Information  Document Information  Misc Clinical: AMB Correspondence  [facility] PRODUCT BACKORDERED/UNAVAIL  09/07/2024 16:32  Attached To:  Jody  Hargis Avery  Source Information  Default, Provider, MD

## 2024-09-07 NOTE — Telephone Encounter (Signed)
 I am on a never ending phonecall with insurance. Anyone able to do this?

## 2024-09-07 NOTE — Telephone Encounter (Signed)
 She said [facility] [address] in [place], please.

## 2024-09-08 ENCOUNTER — Other Ambulatory Visit: Payer: Self-pay | Admitting: Physician Assistant

## 2024-09-08 ENCOUNTER — Ambulatory Visit
Admission: RE | Admit: 2024-09-08 | Discharge: 2024-09-08 | Disposition: A | Discharge status: Home / Self Care | Source: Ambulatory Visit | Attending: Physician Assistant | Admitting: Physician Assistant

## 2024-09-08 DIAGNOSIS — S22080G Wedge compression fracture of T11-T12 vertebra, subsequent encounter for fracture with delayed healing: Secondary | ICD-10-CM

## 2024-09-08 HISTORY — PX: IR RADIOLOGIST EVAL & MGMT: IMG5224

## 2024-09-08 MED ORDER — OXYCODONE HCL 5 MG PO TABS: 5.0000 mg | ORAL_TABLET | Freq: Four times a day (QID) | ORAL | 0 refills | Status: AC | PRN

## 2024-09-08 NOTE — Telephone Encounter (Signed)
 Called patient to let her know about her prescription being sent in.

## 2024-09-08 NOTE — Consult Note (Addendum)
 "      Chief Complaint: Patient was seen in consultation today for L2 compression fracture at the request of Frisbie,Brooke  Referring Physician(s): Ulis Bottcher  Supervising Physician: Karalee Beat  History of Present Illness: Jody  Maebry Avery is a 79 y.o. female significant for asthma, DDD, HLD, osteopenia, osteoporosis who presents today to discuss possible T12 kyphoplasty. [name] has a history of compression fractures and has undergone L1 and L3 kyphoplasty 09/16/23 and T12 kyphoplasty 10/13/23 with Dr. Karalee. She has also undergone right L4-5 and left L4-5 ESIs for low back pain. She was recently sick and had frequent coughing with sudden onset of worsening back pain approximately 3 weeks ago on 08/18/24. She underwent CT lumbar spine w/o contrast 08/21/24 which showed:  1. Age-indeterminate, compression fracture at L2 with mild loss of vertebral body height, without significant retropulsion. This is new when compared with the previous exam. 2. Stable chronic compression fractures at T12, L1, and L3 with cement augmentation.  Her pain has not been controlled with conservative management including bracing, Tylenol  and oxycodone . Given pain relief with previous kyphoplasties, she has been referred to IR for possible L2 kyphoplasty.  Patient seen today with her husband. They tell me she they both had a severe flu in mid December with persistent coughing and poor oral intake, on 12/18 or 19 she had a particularly bad coughing spell which triggered intense low back pain that feels exactly like previous compression fractures. She did not have a fall or any other injury during that time. The pain continued to be severe despite rest, over the counter pain medications (Tylenol ), prescription pain medications (Oxycodone ) and bracing. All medications, including Tylenol , have had the side effect of making her sleepy so she has been sleeping much more frequently than normal. She is able to  walk but requires a cane and holding on to a wall. She has been staying home nearly all the time due to pain and associated trouble walking. Her husband has had to take over all house duties as she is unable to do much because of pain. She has slight improvement in pain when laying on a cold compress however once that's removed any benefit immediately goes away. She also notices very minor improvement with a little bit of walking but if she walks for too long or too fast the pain is worse. She previously had some benefit from PT and has been practicing these exercises at home without much change in her pain. She had 1 shot of Prolia but noted shingles immediately after and was recommended to try Evenity injections however she and her husband are concerned about side effects, particularly jaw necrosis, so she is not interested in taking any further injections. She has been taking AlgaeCal for about 4 months after a recommendation from a friend and notes that it promises improvement within 6 months of starting so they plan to have a bone density scan done soon to assess for any improvement.   Past Medical History:  Diagnosis Date   Actinic keratosis    Allergy    allergic rhinitis   Asthma    Basal cell carcinoma 02/07/2020   R lower pretibia   DDD (degenerative disc disease)    low back pain deg disc dz with infections   Hearing loss    mild   Hyperlipidemia    Osteopenia    Osteoporosis    RAD (reactive airway disease), moderate persistent, uncomplicated    Squamous cell carcinoma of skin 08/10/2013  left dorsal hand/in situ   Tinnitus     Past Surgical History:  Procedure Laterality Date   ABDOMINAL HYSTERECTOMY  1989   total, fibroids   BACK SURGERY     COLONOSCOPY N/A 01/23/2022   Procedure: COLONOSCOPY;  Surgeon: Onita Elspeth Sharper, DO;  Location: [facility] ENDOSCOPY;  Service: Gastroenterology;  Laterality: N/A;   ESOPHAGOGASTRODUODENOSCOPY N/A 01/23/2022   Procedure:  ESOPHAGOGASTRODUODENOSCOPY (EGD);  Surgeon: Onita Elspeth Sharper, DO;  Location: [facility] ENDOSCOPY;  Service: Gastroenterology;  Laterality: N/A;   EYE SURGERY     IR KYPHO EA ADDL LEVEL THORACIC OR LUMBAR  09/16/2023   IR KYPHO LUMBAR INC FX REDUCE BONE BX UNI/BIL CANNULATION INC/IMAGING  09/16/2023   IR KYPHO THORACIC WITH BONE BIOPSY  10/13/2023   IR RADIOLOGIST EVAL & MGMT  08/11/2023   IR RADIOLOGIST EVAL & MGMT  09/04/2023   IR RADIOLOGIST EVAL & MGMT  09/25/2023   IR RADIOLOGIST EVAL & MGMT  09/29/2023   IR RADIOLOGIST EVAL & MGMT  10/27/2023   IR RADIOLOGIST EVAL & MGMT  11/03/2023   IR RADIOLOGIST EVAL & MGMT  11/26/2023   KNEE CARTILAGE SURGERY     torn meniscus  2007   left    TRIGGER FINGER RELEASE Right    right, middle and inder fingers    Allergies: Alendronate sodium, Codeine, Ceftin [cefuroxime], Meloxicam, Penicillins, Prolia [denosumab], Raloxifene, Azithromycin , Ciprofloxacin , Clarithromycin, Diclofenac sodium, Fish oil, Flagyl  [metronidazole ], Rabeprazole sodium, Sulfa antibiotics, and Sulfonamide derivatives  Medications: Prior to Admission medications  Medication Sig Start Date End Date Taking? Authorizing Provider  acetaminophen  (TYLENOL ) 500 MG tablet Take 500 mg by mouth every 8 (eight) hours as needed for moderate pain (pain score 4-6).    [provider]  acetaminophen  (TYLENOL ) 500 MG tablet Take 2 tablets (1,000 mg total) by mouth every 6 (six) hours as needed. 09/07/24 09/07/25  Ulis Bottcher, PA-C  azelastine (ASTELIN) 0.1 % nasal spray Place into both nostrils 2 (two) times daily. Use in each nostril as directed    [provider]  cholecalciferol (VITAMIN D ) 1000 units tablet Take 4,000 Units by mouth daily.    [provider]  hydrocortisone  2.5 % cream Apply to affected area rash once to twice daily as directed. Patient not taking: Reported on 11/26/2023 06/03/23   Jackquline Sawyer, MD  lidocaine  (LIDODERM ) 5 % Place 1 patch onto the skin  every 12 (twelve) hours. Remove & Discard patch within 12 hours or as directed by MD 08/21/24 08/21/25  Willo Dunnings, MD  LUTEIN PO Take 1 tablet by mouth every other day.    [provider]  oxyCODONE  (ROXICODONE ) 5 MG immediate release tablet Take 1 tablet (5 mg total) by mouth every 6 (six) hours as needed for severe pain (pain score 7-10). 08/21/24   Willo Dunnings, MD  oxyCODONE  (ROXICODONE ) 5 MG immediate release tablet Take 1 tablet (5 mg total) by mouth every 6 (six) hours as needed. 09/07/24 09/07/25  Ulis Bottcher, PA-C  polyethylene glycol powder (GLYCOLAX/MIRALAX) 17 GM/SCOOP powder Take 1 Container by mouth once.    [provider]  Tavaborole  5 % SOLN Apply 1 Application topically at bedtime. qhs to right thumbnail 08/01/24   Stewart, Tara, MD  torsemide (DEMADEX) 5 MG tablet Take 5 mg by mouth daily as needed.    [provider]  vitamin B-12 (CYANOCOBALAMIN) 1000 MCG tablet Take 1,000 mcg by mouth once a week.    [provider]     Family History  Problem Relation Age of Onset   Osteoporosis Mother    Cancer Mother        ? liver cancer ? primary   Heart disease Father        MI   Osteoporosis Sister    Diabetes Sister    Heart disease Sister        CHF   Heart disease Brother        CAD   Heart disease Brother        CAD   Osteoporosis Sister    Diabetes Sister    Breast cancer Neg Hx     Social History   Socioeconomic History   Marital status: Married    Spouse name: Not on file   Number of children: Not on file   Years of education: Not on file   Highest education level: Not on file  Occupational History   Not on file  Tobacco Use   Smoking status: Never   Smokeless tobacco: Never  Vaping Use   Vaping status: Never Used  Substance and Sexual Activity   Alcohol use: No    Alcohol/week: 0.0 standard drinks of alcohol   Drug use: No   Sexual activity: Never  Other Topics Concern   Not on file  Social History  Narrative   Not on file   Social Drivers of Health   Tobacco Use: Low Risk (09/07/2024)   Patient History    Smoking Tobacco Use: Never    Smokeless Tobacco Use: Never    Passive Exposure: Not on file  Financial Resource Strain: Low Risk  (07/18/2024)   Received from [facility]   Overall Financial Resource Strain (CARDIA)    Difficulty of Paying Living Expenses: Not hard at all  Food Insecurity: No Food Insecurity (07/18/2024)   Received from [facility]   Epic    Within the past 12 months, you worried that your food would run out before you got the money to buy more.: Never true    Within the past 12 months, the food you bought just didn't last and you didn't have money to get more.: Never true  Transportation Needs: No Transportation Needs (07/18/2024)   Received from [facility] - Transportation    In the past 12 months, has lack of transportation kept you from medical appointments or from getting medications?: No    Lack of Transportation (Non-Medical): No  Physical Activity: Not on file  Stress: Not on file  Social Connections: Not on file  Depression (EYV7-0): Not on file  Alcohol Screen: Not on file  Housing: Low Risk  (08/23/2024)   Received from [facility]   Epic    In the last 12 months, was there a time when you were not able to pay the mortgage or rent on time?: No    In the past 12 months, how many times have you moved where you were living?: 0    At any time in the past 12 months, were you homeless or living in a shelter (including now)?: No  Utilities: Not At Risk (07/18/2024)   Received from [facility]   Epic    In the past 12 months has the electric, gas, oil, or water company threatened to shut off services in your home?: No  Health Literacy: Not on file    Review of Systems: A 12 point ROS discussed and pertinent positives are indicated in  the HPI above.   All other systems are negative.  Review of Systems  Constitutional:  Positive for activity change and fatigue. Negative for fever.  Respiratory:  Negative for shortness of breath.   Cardiovascular:  Negative for chest pain.  Gastrointestinal:  Negative for abdominal pain.  Genitourinary:  Negative for difficulty urinating and frequency.       (-) loss of bladder/bowel control  Musculoskeletal:  Positive for arthralgias and back pain. Gait problem: trouble walking without cane/holding on to something. Neurological:  Negative for syncope.    Vital Signs: BP (!) 156/85 (BP Location: Left Arm, Patient Position: Sitting, Cuff Size: Normal)   Pulse 77   Temp 98.1 F (36.7 C) (Oral)   Resp 16   Wt 168 lb (76.2 kg)   SpO2 96%   BMI 32.98 kg/m   Physical Exam Vitals reviewed.  HENT:     Head: Normocephalic.  Cardiovascular:     Rate and Rhythm: Normal rate.  Pulmonary:     Effort: Pulmonary effort is normal.  Abdominal:     Palpations: Abdomen is soft.  Musculoskeletal:     Comments: (+) diffuse tenderness of low back, point tenderness near L2 region.  Skin:    General: Skin is warm and dry.  Neurological:     Mental Status: She is alert and oriented to person, place, and time.  Psychiatric:        Mood and Affect: Mood normal.        Behavior: Behavior normal.        Thought Content: Thought content normal.        Judgment: Judgment normal.      Imaging: CT Lumbar Spine Wo Contrast Result Date: 08/21/2024 EXAM: CT OF THE LUMBAR SPINE WITHOUT CONTRAST 08/21/2024 06:19:19 AM TECHNIQUE: CT of the lumbar spine was performed without the administration of intravenous contrast. Multiplanar reformatted images are provided for review. Automated exposure control, iterative reconstruction, and/or weight based adjustment of the mA/kV was utilized to reduce the radiation dose to as low as reasonably achievable. COMPARISON: MRI lumbar spine 10/24/2023. CLINICAL HISTORY: Back trauma, no  prior imaging (Age >= 16y). FINDINGS: BONES AND ALIGNMENT: 5 lumbar type vertebra identified. The alignment of the lumbar spine appears normal. Chronic compression fractures involving T12, L1, and L3 with changes of cement augmentation. These are stable compared with the previous exam. There is mild loss of the vertebral body height at the L2 level which measures 1.8 cm on today's study versus 2.2 cm on 10/24/2023. This reflects an age-indeterminate fracture with no significant retropulsion. The L4 and L5 vertebral body heights are maintained. No acute fracture or suspicious bone lesion. DEGENERATIVE CHANGES: Moderate disc space narrowing and endplate spurring are noted at L4-L5. Mild bilateral facet arthropathy. SOFT TISSUES: No acute abnormality. IMPRESSION: 1. Age-indeterminate, compression fracture at L2 with mild loss of vertebral body height, without significant retropulsion. This is new when compared with the previous exam. 2. Stable chronic compression fractures at T12, L1, and L3 with cement augmentation. Electronically signed by: Waddell Calk MD 08/21/2024 07:00 AM EST RP Workstation: [id number]    Labs:  CBC: No results for input(s): WBC, HGB, HCT, PLT in the last 8760 hours.  COAGS: No results for input(s): INR, APTT in the last 8760 hours.  BMP: Recent Labs    12/07/23 0000  CALCIUM 10.0    LIVER FUNCTION TESTS: No results for input(s): BILITOT, AST, ALT, ALKPHOS, PROT, ALBUMIN in the last 8760 hours.  TUMOR MARKERS: No results  for input(s): AFPTM, CEA, CA199, CHROMGRNA in the last 8760 hours.  Assessment:  79 y/o F with history of multiple compression fractures at L1, L3 and T12 s/p kyphoplasties 09/16/23 and 10/13/23 found to have new highly symptomatic L2 compression fracture who presents today to discuss possible L2 kyphoplasty.  Per patient history today, fracture likely occurred on 12/18 or 12/19 causing immediate severe pain in the L2  region. She has tried conservative management at home including bracing, tylenol , Oxycodone , rest, PT exercises - all of which have failed to provide any significant improvement in pain. The pain is affecting her ADLs and has required her husband to take over all household duties and requiring to use a cane plus wall support to walk. Her gait is very slowed and she has trouble getting in and out of bed/chairs.   Roland Morris disability score = 23  Given failed conservative management and history of delayed healing with previous compression fractures with delayed healing as well as significant symptom relief after previous kyphoplasties, she is a good candidate for L2 kyphoplasty. She is interested in proceeding ASAP.  1.) Please schedule for L2 cement augmentation with balloon kyphoplasty next week if possible.  M80.08XA Age-related osteoporosis with current pathological fracture, vertebra(e), initial encounter for fracture  Electronically Signed: Clotilda DELENA Hesselbach PA-C 09/08/2024, 8:56 AM   I spent a total of 30 Minutes  in face to face in clinical consultation, greater than 50% of which was counseling/coordinating care for L2 compression fracture.   "

## 2024-09-09 ENCOUNTER — Telehealth: Payer: Self-pay | Admitting: Family Medicine

## 2024-09-09 NOTE — Telephone Encounter (Signed)
 I received a fax from [facility] that says the Oxycodone  is on backorder. I spoke to patient and she states she has enough to get her to January 19 according to the pharmacy. I asked her where at prescription came from. She said she got it from Dr. Cleotilde. I informed her that we are going to cancel the prescription and if he is refilling the medication for her to continue getting it through him. She will let us  know if there is any problems getting the medication from him when she is do again.  I called the pharmacy and cancelled the prescription.

## 2024-09-12 ENCOUNTER — Ambulatory Visit: Admitting: Physician Assistant

## 2024-09-12 ENCOUNTER — Other Ambulatory Visit

## 2024-09-19 ENCOUNTER — Telehealth: Payer: Self-pay | Admitting: Family Medicine

## 2024-09-19 NOTE — Telephone Encounter (Signed)
 PA has been approved. [facility] Notified patient per DPR.

## 2024-09-19 NOTE — Telephone Encounter (Signed)
 A prior authorization was sent to plan on Cover my meds. oxyCODONE  HCl 5MG  tablets PA Case ID #: [id number] Rx #: [id number]

## 2024-09-20 ENCOUNTER — Other Ambulatory Visit: Payer: Self-pay | Admitting: Interventional Radiology

## 2024-09-20 DIAGNOSIS — S32020A Wedge compression fracture of second lumbar vertebra, initial encounter for closed fracture: Secondary | ICD-10-CM

## 2024-09-21 ENCOUNTER — Ambulatory Visit: Admitting: Physician Assistant

## 2024-09-21 ENCOUNTER — Other Ambulatory Visit

## 2024-09-22 NOTE — Discharge Instructions (Signed)
 Kyphoplasty Post Procedure Discharge Instructions  May resume a regular diet and any medications that you routinely take (including pain medications). However, if you are taking Aspirin or an anticoagulant/blood thinner you will be told when you can resume taking these by the healthcare provider. No driving day of procedure. The day of your procedure take it easy. You may use an ice pack as needed to injection sites on back.  Ice to back 30 minutes on and 30 minutes off, as needed. May remove bandaids tomorrow after taking a shower. Replace daily with a clean bandaid until healed.  Do not lift anything heavier than a milk jug for 1-2 weeks or determined by your physician.  Follow up with your physician in 2 weeks.  If you need to speak to someone after hours, please call the on call IR physician at [phone].  Tell them you are a patient of Dr. Karalee and that you had a Kyphoplasty today and the issues you are having.   Please contact our office at [phone]  for the following symptoms or if you have any questions:  Fever greater than 100 degrees Increased swelling, pain, or redness at injection site. Increased back and/or leg pain New numbness or change in symptoms from before the procedure.    Thank you for visiting [facility]!

## 2024-09-23 ENCOUNTER — Telehealth: Payer: Self-pay

## 2024-09-27 ENCOUNTER — Inpatient Hospital Stay
Admission: RE | Admit: 2024-09-27 | Discharge: 2024-09-27 | Disposition: A | Discharge status: Home / Self Care | Source: Ambulatory Visit | Attending: Interventional Radiology | Admitting: Interventional Radiology

## 2024-09-29 ENCOUNTER — Ambulatory Visit
Admission: RE | Admit: 2024-09-29 | Discharge: 2024-09-29 | Disposition: A | Discharge status: Home / Self Care | Source: Ambulatory Visit | Attending: Interventional Radiology | Admitting: Interventional Radiology

## 2024-09-29 DIAGNOSIS — S32020A Wedge compression fracture of second lumbar vertebra, initial encounter for closed fracture: Secondary | ICD-10-CM

## 2024-09-29 MED ORDER — VANCOMYCIN HCL IN DEXTROSE 1-5 GM/200ML-% IV SOLN
1000.0000 mg | INTRAVENOUS | Status: AC
  Administered 2024-09-29: 1000 mg via INTRAVENOUS

## 2024-09-29 MED ORDER — FENTANYL CITRATE (PF) 50 MCG/ML IJ SOSY: 25.0000 ug | PREFILLED_SYRINGE | INTRAMUSCULAR | Status: DC | PRN

## 2024-09-29 MED ORDER — MIDAZOLAM HCL (PF) 2 MG/2ML IJ SOLN
INTRAMUSCULAR | Status: AC | PRN
  Administered 2024-09-29 (×2): 1 mg via INTRAVENOUS

## 2024-09-29 MED ORDER — FENTANYL CITRATE (PF) 100 MCG/2ML IJ SOLN
INTRAMUSCULAR | Status: AC | PRN
  Administered 2024-09-29: 50 ug via INTRAVENOUS
  Administered 2024-09-29 (×2): 25 ug via INTRAVENOUS

## 2024-09-29 MED ORDER — SODIUM CHLORIDE 0.9 % IV SOLN: INTRAVENOUS | Status: DC

## 2024-09-29 MED ORDER — LIDOCAINE HCL (PF) 1 % IJ SOLN
10.0000 mL | Freq: Once | INTRAMUSCULAR | Status: AC
  Administered 2024-09-29: 10 mL via INTRADERMAL

## 2024-09-29 MED ORDER — MIDAZOLAM HCL (PF) 2 MG/2ML IJ SOLN: 1.0000 mg | INTRAMUSCULAR | Status: DC | PRN

## 2024-09-29 MED ORDER — ACETAMINOPHEN 10 MG/ML IV SOLN
1000.0000 mg | Freq: Once | INTRAVENOUS | Status: AC
  Administered 2024-09-29: 1000 mg via INTRAVENOUS

## 2024-09-29 MED ORDER — ONDANSETRON HCL 4 MG/2ML IJ SOLN
8.0000 mg | Freq: Once | INTRAMUSCULAR | Status: AC
  Administered 2024-09-29: 8 mg via INTRAVENOUS

## 2024-09-29 NOTE — Discharge Instructions (Signed)
 Kyphoplasty Post Procedure Discharge Instructions  May resume a regular diet and any medications that you routinely take (including pain medications). However, if you are taking Aspirin or an anticoagulant/blood thinner you will be told when you can resume taking these by the healthcare provider. No driving day of procedure. The day of your procedure take it easy. You may use an ice pack as needed to injection sites on back.  Ice to back 30 minutes on and 30 minutes off, as needed. May remove bandaids tomorrow after taking a shower. Replace daily with a clean bandaid until healed.  Do not lift anything heavier than a milk jug for 1-2 weeks or determined by your physician.  Follow up with your physician in 2 weeks.  If you need to speak to someone after hours, please call the on call IR physician at [phone].  Tell them you are a patient of Dr. Karalee and that you had a Kyphoplasty today and the issues you are having.   Please contact our office at [phone]  for the following symptoms or if you have any questions:  Fever greater than 100 degrees Increased swelling, pain, or redness at injection site. Increased back and/or leg pain New numbness or change in symptoms from before the procedure.    Thank you for visiting [facility]!

## 2024-10-04 ENCOUNTER — Other Ambulatory Visit

## 2024-10-05 ENCOUNTER — Other Ambulatory Visit: Payer: Self-pay | Admitting: Interventional Radiology

## 2024-10-05 DIAGNOSIS — S22080G Wedge compression fracture of T11-T12 vertebra, subsequent encounter for fracture with delayed healing: Secondary | ICD-10-CM

## 2024-10-06 ENCOUNTER — Telehealth: Payer: Self-pay

## 2024-10-13 ENCOUNTER — Other Ambulatory Visit

## 2025-08-07 ENCOUNTER — Encounter: Admitting: Dermatology
